# Patient Record
Sex: Female | Born: 1935 | Race: White | Hispanic: No | Smoking: Former smoker
Health system: Southern US, Community
[De-identification: ages and names within clinical notes are randomized; demographics above are authoritative.]

## PROBLEM LIST (undated history)

## (undated) DIAGNOSIS — Z87891 Personal history of nicotine dependence: Secondary | ICD-10-CM

## (undated) DIAGNOSIS — R911 Solitary pulmonary nodule: Secondary | ICD-10-CM

## (undated) DIAGNOSIS — R296 Repeated falls: Secondary | ICD-10-CM

## (undated) DIAGNOSIS — R350 Frequency of micturition: Secondary | ICD-10-CM

## (undated) DIAGNOSIS — I10 Essential (primary) hypertension: Secondary | ICD-10-CM

## (undated) DIAGNOSIS — D509 Iron deficiency anemia, unspecified: Secondary | ICD-10-CM

## (undated) DIAGNOSIS — K219 Gastro-esophageal reflux disease without esophagitis: Secondary | ICD-10-CM

## (undated) DIAGNOSIS — L299 Pruritus, unspecified: Secondary | ICD-10-CM

## (undated) DIAGNOSIS — I3139 Other pericardial effusion (noninflammatory): Secondary | ICD-10-CM

## (undated) DIAGNOSIS — F22 Delusional disorders: Secondary | ICD-10-CM

## (undated) DIAGNOSIS — J309 Allergic rhinitis, unspecified: Secondary | ICD-10-CM

## (undated) DIAGNOSIS — E785 Hyperlipidemia, unspecified: Secondary | ICD-10-CM

## (undated) DIAGNOSIS — M810 Age-related osteoporosis without current pathological fracture: Secondary | ICD-10-CM

## (undated) DIAGNOSIS — M1990 Unspecified osteoarthritis, unspecified site: Secondary | ICD-10-CM

## (undated) DIAGNOSIS — S72143A Displaced intertrochanteric fracture of unspecified femur, initial encounter for closed fracture: Secondary | ICD-10-CM

## (undated) DIAGNOSIS — E876 Hypokalemia: Secondary | ICD-10-CM

## (undated) DIAGNOSIS — K635 Polyp of colon: Secondary | ICD-10-CM

## (undated) DIAGNOSIS — F411 Generalized anxiety disorder: Secondary | ICD-10-CM

## (undated) DIAGNOSIS — N184 Chronic kidney disease, stage 4 (severe): Secondary | ICD-10-CM

## (undated) DIAGNOSIS — F0151 Vascular dementia with behavioral disturbance: Secondary | ICD-10-CM

## (undated) DIAGNOSIS — G459 Transient cerebral ischemic attack, unspecified: Secondary | ICD-10-CM

## (undated) DIAGNOSIS — G2581 Restless legs syndrome: Secondary | ICD-10-CM

## (undated) DIAGNOSIS — E039 Hypothyroidism, unspecified: Secondary | ICD-10-CM

## (undated) DIAGNOSIS — I313 Pericardial effusion (noninflammatory): Secondary | ICD-10-CM

## (undated) DIAGNOSIS — R634 Abnormal weight loss: Secondary | ICD-10-CM

## (undated) DIAGNOSIS — M545 Low back pain: Secondary | ICD-10-CM

## (undated) HISTORY — DX: Iron deficiency anemia, unspecified: D50.9

## (undated) HISTORY — PX: KNEE SURGERY: SHX244

## (undated) HISTORY — PX: COLONOSCOPY: SHX174

## (undated) HISTORY — PX: TONSILLECTOMY: SUR1361

## (undated) HISTORY — PX: BACK SURGERY: SHX140

## (undated) HISTORY — DX: Unspecified osteoarthritis, unspecified site: M19.90

## (undated) HISTORY — PX: LAMINECTOMY: SHX219

## (undated) HISTORY — PX: FEMUR IM NAIL: SHX1597

## (undated) HISTORY — DX: Vascular dementia with behavioral disturbance: F01.51

## (undated) HISTORY — PX: TONSILLECTOMY: SHX5217

## (undated) HISTORY — DX: Allergic rhinitis, unspecified: J30.9

## (undated) HISTORY — DX: Low back pain: M54.5

## (undated) HISTORY — DX: Delusional disorders: F22

## (undated) HISTORY — DX: Abnormal weight loss: R63.4

## (undated) HISTORY — DX: Hypokalemia: E87.6

## (undated) HISTORY — DX: Frequency of micturition: R35.0

## (undated) HISTORY — PX: HEMORRHOID SURGERY: SHX153

## (undated) HISTORY — PX: KNEE ARTHROSCOPY: SUR90

## (undated) HISTORY — DX: Generalized anxiety disorder: F41.1

## (undated) HISTORY — DX: Gastro-esophageal reflux disease without esophagitis: K21.9

## (undated) HISTORY — PX: ABDOMINAL HYSTERECTOMY: SHX81

## (undated) HISTORY — DX: Age-related osteoporosis without current pathological fracture: M81.0

## (undated) HISTORY — PX: APPENDECTOMY: SHX54

## (undated) HISTORY — DX: Pruritus, unspecified: L29.9

---

## 1998-04-06 ENCOUNTER — Encounter: Admission: RE | Admit: 1998-04-06 | Discharge: 1998-07-05 | Payer: Self-pay | Admitting: Orthopaedic Surgery

## 2000-07-16 DIAGNOSIS — K635 Polyp of colon: Secondary | ICD-10-CM

## 2000-07-16 HISTORY — DX: Polyp of colon: K63.5

## 2000-10-01 ENCOUNTER — Encounter (INDEPENDENT_AMBULATORY_CARE_PROVIDER_SITE_OTHER): Payer: Self-pay | Admitting: *Deleted

## 2000-10-01 ENCOUNTER — Ambulatory Visit (HOSPITAL_COMMUNITY): Admission: RE | Admit: 2000-10-01 | Discharge: 2000-10-01 | Payer: Self-pay | Admitting: Gastroenterology

## 2004-02-28 ENCOUNTER — Encounter: Admission: RE | Admit: 2004-02-28 | Discharge: 2004-02-28 | Payer: Self-pay | Admitting: Orthopedic Surgery

## 2004-03-01 ENCOUNTER — Ambulatory Visit (HOSPITAL_COMMUNITY): Admission: RE | Admit: 2004-03-01 | Discharge: 2004-03-01 | Payer: Self-pay | Admitting: Orthopedic Surgery

## 2004-03-01 ENCOUNTER — Ambulatory Visit (HOSPITAL_BASED_OUTPATIENT_CLINIC_OR_DEPARTMENT_OTHER): Admission: RE | Admit: 2004-03-01 | Discharge: 2004-03-01 | Payer: Self-pay | Admitting: Orthopedic Surgery

## 2004-05-18 ENCOUNTER — Ambulatory Visit: Payer: Self-pay | Admitting: Internal Medicine

## 2004-06-13 ENCOUNTER — Ambulatory Visit (HOSPITAL_COMMUNITY): Admission: RE | Admit: 2004-06-13 | Discharge: 2004-06-13 | Payer: Self-pay | Admitting: Orthopedic Surgery

## 2004-06-26 ENCOUNTER — Ambulatory Visit: Payer: Self-pay | Admitting: Internal Medicine

## 2004-08-10 ENCOUNTER — Ambulatory Visit: Payer: Self-pay | Admitting: Internal Medicine

## 2004-08-24 ENCOUNTER — Ambulatory Visit: Payer: Self-pay | Admitting: Internal Medicine

## 2004-09-26 ENCOUNTER — Ambulatory Visit: Payer: Self-pay | Admitting: Internal Medicine

## 2004-10-06 ENCOUNTER — Ambulatory Visit: Payer: Self-pay | Admitting: Internal Medicine

## 2004-11-09 ENCOUNTER — Ambulatory Visit: Payer: Self-pay | Admitting: Internal Medicine

## 2004-12-07 ENCOUNTER — Ambulatory Visit: Payer: Self-pay | Admitting: Internal Medicine

## 2005-01-29 ENCOUNTER — Ambulatory Visit: Payer: Self-pay | Admitting: Internal Medicine

## 2005-04-04 ENCOUNTER — Ambulatory Visit: Payer: Self-pay | Admitting: Family Medicine

## 2005-05-01 ENCOUNTER — Ambulatory Visit: Payer: Self-pay | Admitting: Internal Medicine

## 2005-07-05 ENCOUNTER — Ambulatory Visit: Payer: Self-pay | Admitting: Internal Medicine

## 2005-07-13 ENCOUNTER — Ambulatory Visit: Payer: Self-pay

## 2005-07-13 ENCOUNTER — Encounter: Payer: Self-pay | Admitting: Cardiology

## 2005-10-08 ENCOUNTER — Ambulatory Visit: Payer: Self-pay | Admitting: Internal Medicine

## 2005-10-23 ENCOUNTER — Encounter: Admission: RE | Admit: 2005-10-23 | Discharge: 2005-10-23 | Payer: Self-pay | Admitting: Orthopaedic Surgery

## 2006-01-08 ENCOUNTER — Ambulatory Visit: Payer: Self-pay | Admitting: Internal Medicine

## 2006-05-06 ENCOUNTER — Ambulatory Visit: Payer: Self-pay | Admitting: Internal Medicine

## 2006-06-18 ENCOUNTER — Ambulatory Visit: Payer: Self-pay | Admitting: Internal Medicine

## 2006-09-05 ENCOUNTER — Ambulatory Visit: Payer: Self-pay | Admitting: Internal Medicine

## 2006-09-05 LAB — CONVERTED CEMR LAB
ALT: 10 units/L (ref 0–40)
AST: 23 units/L (ref 0–37)
Albumin: 3.4 g/dL — ABNORMAL LOW (ref 3.5–5.2)
Alkaline Phosphatase: 64 units/L (ref 39–117)
BUN: 19 mg/dL (ref 6–23)
Basophils Absolute: 0 10*3/uL (ref 0.0–0.1)
Basophils Relative: 0.7 % (ref 0.0–1.0)
Bilirubin, Direct: 0.1 mg/dL (ref 0.0–0.3)
CO2: 29 meq/L (ref 19–32)
Calcium: 9.3 mg/dL (ref 8.4–10.5)
Chloride: 103 meq/L (ref 96–112)
Cholesterol: 235 mg/dL (ref 0–200)
Creatinine, Ser: 1.8 mg/dL — ABNORMAL HIGH (ref 0.4–1.2)
Direct LDL: 151.6 mg/dL
Eosinophils Absolute: 0.1 10*3/uL (ref 0.0–0.6)
Eosinophils Relative: 2.2 % (ref 0.0–5.0)
GFR calc Af Amer: 35 mL/min
GFR calc non Af Amer: 29 mL/min
Glucose, Bld: 97 mg/dL (ref 70–99)
HCT: 35 % — ABNORMAL LOW (ref 36.0–46.0)
HDL: 59.3 mg/dL (ref >39.0)
Hemoglobin: 11.9 g/dL — ABNORMAL LOW (ref 12.0–15.0)
Lymphocytes Relative: 24.2 % (ref 12.0–46.0)
MCHC: 33.9 g/dL (ref 30.0–36.0)
MCV: 87.5 fL (ref 78.0–100.0)
Monocytes Absolute: 0.3 10*3/uL (ref 0.2–0.7)
Monocytes Relative: 5.5 % (ref 3.0–11.0)
Neutro Abs: 4.1 10*3/uL (ref 1.4–7.7)
Neutrophils Relative %: 67.4 % (ref 43.0–77.0)
Platelets: 291 10*3/uL (ref 150–400)
Potassium: 3.7 meq/L (ref 3.5–5.1)
RBC: 4 M/uL (ref 3.87–5.11)
RDW: 14.8 % — ABNORMAL HIGH (ref 11.5–14.6)
Sodium: 140 meq/L (ref 135–145)
TSH: 3.23 microintl units/mL (ref 0.35–5.50)
Total Bilirubin: 0.8 mg/dL (ref 0.3–1.2)
Total CHOL/HDL Ratio: 4
Total Protein: 6.8 g/dL (ref 6.0–8.3)
Triglycerides: 98 mg/dL (ref 0–149)
VLDL: 20 mg/dL (ref 0–40)
WBC: 5.9 10*3/uL (ref 4.5–10.5)

## 2006-09-12 ENCOUNTER — Ambulatory Visit: Payer: Self-pay | Admitting: Internal Medicine

## 2006-10-01 ENCOUNTER — Ambulatory Visit: Payer: Self-pay | Admitting: Internal Medicine

## 2006-10-08 ENCOUNTER — Encounter: Admission: RE | Admit: 2006-10-08 | Discharge: 2006-10-08 | Payer: Self-pay | Admitting: Orthopaedic Surgery

## 2006-12-04 ENCOUNTER — Encounter: Admission: RE | Admit: 2006-12-04 | Discharge: 2006-12-04 | Payer: Self-pay | Admitting: Orthopaedic Surgery

## 2006-12-11 ENCOUNTER — Ambulatory Visit: Payer: Self-pay | Admitting: Internal Medicine

## 2006-12-20 ENCOUNTER — Encounter: Admission: RE | Admit: 2006-12-20 | Discharge: 2006-12-20 | Payer: Self-pay | Admitting: Orthopaedic Surgery

## 2007-02-04 DIAGNOSIS — E039 Hypothyroidism, unspecified: Secondary | ICD-10-CM | POA: Insufficient documentation

## 2007-02-04 DIAGNOSIS — M810 Age-related osteoporosis without current pathological fracture: Secondary | ICD-10-CM

## 2007-02-04 DIAGNOSIS — K219 Gastro-esophageal reflux disease without esophagitis: Secondary | ICD-10-CM

## 2007-02-04 DIAGNOSIS — J309 Allergic rhinitis, unspecified: Secondary | ICD-10-CM | POA: Insufficient documentation

## 2007-02-04 DIAGNOSIS — I10 Essential (primary) hypertension: Secondary | ICD-10-CM | POA: Insufficient documentation

## 2007-02-04 HISTORY — DX: Age-related osteoporosis without current pathological fracture: M81.0

## 2007-02-04 HISTORY — DX: Allergic rhinitis, unspecified: J30.9

## 2007-02-04 HISTORY — DX: Gastro-esophageal reflux disease without esophagitis: K21.9

## 2007-02-05 ENCOUNTER — Encounter: Payer: Self-pay | Admitting: Internal Medicine

## 2007-03-05 ENCOUNTER — Ambulatory Visit: Payer: Self-pay | Admitting: Internal Medicine

## 2007-04-29 ENCOUNTER — Encounter: Payer: Self-pay | Admitting: Internal Medicine

## 2007-05-15 ENCOUNTER — Telehealth: Payer: Self-pay | Admitting: Internal Medicine

## 2007-05-29 ENCOUNTER — Ambulatory Visit (HOSPITAL_COMMUNITY): Admission: RE | Admit: 2007-05-29 | Discharge: 2007-05-29 | Payer: Self-pay | Admitting: Neurosurgery

## 2007-06-03 ENCOUNTER — Encounter: Payer: Self-pay | Admitting: Internal Medicine

## 2007-06-04 ENCOUNTER — Ambulatory Visit: Payer: Self-pay | Admitting: Internal Medicine

## 2007-06-10 ENCOUNTER — Encounter: Payer: Self-pay | Admitting: Internal Medicine

## 2007-07-30 ENCOUNTER — Encounter: Payer: Self-pay | Admitting: Internal Medicine

## 2007-08-28 ENCOUNTER — Inpatient Hospital Stay (HOSPITAL_COMMUNITY): Admission: RE | Admit: 2007-08-28 | Discharge: 2007-09-01 | Payer: Self-pay | Admitting: Neurosurgery

## 2007-10-01 ENCOUNTER — Ambulatory Visit: Payer: Self-pay | Admitting: Internal Medicine

## 2007-11-08 ENCOUNTER — Emergency Department (HOSPITAL_COMMUNITY): Admission: EM | Admit: 2007-11-08 | Discharge: 2007-11-08 | Payer: Self-pay | Admitting: Emergency Medicine

## 2007-11-12 ENCOUNTER — Encounter: Payer: Self-pay | Admitting: Internal Medicine

## 2007-11-18 ENCOUNTER — Encounter: Admission: RE | Admit: 2007-11-18 | Discharge: 2007-11-18 | Payer: Self-pay | Admitting: Neurosurgery

## 2007-12-16 ENCOUNTER — Encounter: Admission: RE | Admit: 2007-12-16 | Discharge: 2007-12-16 | Payer: Self-pay | Admitting: Orthopedic Surgery

## 2007-12-30 ENCOUNTER — Encounter: Payer: Self-pay | Admitting: Internal Medicine

## 2007-12-31 ENCOUNTER — Ambulatory Visit: Payer: Self-pay | Admitting: Internal Medicine

## 2007-12-31 DIAGNOSIS — M1990 Unspecified osteoarthritis, unspecified site: Secondary | ICD-10-CM | POA: Insufficient documentation

## 2007-12-31 DIAGNOSIS — M545 Low back pain, unspecified: Secondary | ICD-10-CM

## 2007-12-31 HISTORY — DX: Unspecified osteoarthritis, unspecified site: M19.90

## 2007-12-31 HISTORY — DX: Low back pain, unspecified: M54.50

## 2008-01-06 ENCOUNTER — Encounter: Payer: Self-pay | Admitting: Neurosurgery

## 2008-01-12 ENCOUNTER — Ambulatory Visit (HOSPITAL_COMMUNITY): Admission: RE | Admit: 2008-01-12 | Discharge: 2008-01-12 | Payer: Self-pay | Admitting: Interventional Radiology

## 2008-01-20 ENCOUNTER — Telehealth: Payer: Self-pay | Admitting: Internal Medicine

## 2008-01-22 ENCOUNTER — Encounter: Payer: Self-pay | Admitting: Interventional Radiology

## 2008-02-02 ENCOUNTER — Telehealth: Payer: Self-pay | Admitting: Internal Medicine

## 2008-02-03 ENCOUNTER — Ambulatory Visit: Payer: Self-pay | Admitting: Internal Medicine

## 2008-02-03 DIAGNOSIS — N390 Urinary tract infection, site not specified: Secondary | ICD-10-CM

## 2008-02-03 LAB — CONVERTED CEMR LAB
Bilirubin Urine: NEGATIVE
Blood in Urine, dipstick: NEGATIVE
Glucose, Urine, Semiquant: NEGATIVE
Ketones, urine, test strip: NEGATIVE
Nitrite: NEGATIVE
Protein, U semiquant: 30
Specific Gravity, Urine: 1.02
Urobilinogen, UA: NEGATIVE
pH: 7

## 2008-02-10 ENCOUNTER — Encounter: Payer: Self-pay | Admitting: Internal Medicine

## 2008-04-01 ENCOUNTER — Telehealth: Payer: Self-pay | Admitting: Internal Medicine

## 2008-04-28 ENCOUNTER — Ambulatory Visit: Payer: Self-pay | Admitting: Internal Medicine

## 2008-04-28 DIAGNOSIS — R350 Frequency of micturition: Secondary | ICD-10-CM

## 2008-04-28 HISTORY — DX: Frequency of micturition: R35.0

## 2008-06-21 ENCOUNTER — Ambulatory Visit: Payer: Self-pay | Admitting: Internal Medicine

## 2008-06-21 LAB — CONVERTED CEMR LAB
Bilirubin Urine: NEGATIVE
Glucose, Urine, Semiquant: NEGATIVE
Ketones, urine, test strip: NEGATIVE
Nitrite: NEGATIVE
Protein, U semiquant: >=300
Specific Gravity, Urine: 1.01
Urobilinogen, UA: NEGATIVE
pH: 6

## 2008-06-22 ENCOUNTER — Encounter: Payer: Self-pay | Admitting: Emergency Medicine

## 2008-06-22 ENCOUNTER — Ambulatory Visit: Payer: Self-pay | Admitting: Interventional Radiology

## 2008-06-22 ENCOUNTER — Telehealth: Payer: Self-pay | Admitting: Internal Medicine

## 2008-06-23 ENCOUNTER — Ambulatory Visit: Payer: Self-pay | Admitting: Cardiovascular Disease

## 2008-06-23 ENCOUNTER — Encounter (INDEPENDENT_AMBULATORY_CARE_PROVIDER_SITE_OTHER): Payer: Self-pay | Admitting: Internal Medicine

## 2008-06-23 ENCOUNTER — Encounter: Payer: Self-pay | Admitting: Internal Medicine

## 2008-06-23 ENCOUNTER — Inpatient Hospital Stay (HOSPITAL_COMMUNITY): Admission: EM | Admit: 2008-06-23 | Discharge: 2008-06-26 | Payer: Self-pay | Admitting: Endocrinology

## 2008-06-23 ENCOUNTER — Ambulatory Visit: Payer: Self-pay | Admitting: Internal Medicine

## 2008-06-30 ENCOUNTER — Ambulatory Visit: Payer: Self-pay | Admitting: Internal Medicine

## 2008-06-30 DIAGNOSIS — D649 Anemia, unspecified: Secondary | ICD-10-CM

## 2008-06-30 LAB — CONVERTED CEMR LAB
Albumin: 3.1 g/dL — ABNORMAL LOW (ref 3.5–5.2)
BUN: 14 mg/dL (ref 6–23)
Basophils Absolute: 0 10*3/uL (ref 0.0–0.1)
Basophils Relative: 0.1 % (ref 0.0–3.0)
Blood in Urine, dipstick: NEGATIVE
CO2: 29 meq/L (ref 19–32)
Calcium: 9.5 mg/dL (ref 8.4–10.5)
Chloride: 103 meq/L (ref 96–112)
Creatinine, Ser: 1.5 mg/dL — ABNORMAL HIGH (ref 0.4–1.2)
Eosinophils Absolute: 0.1 10*3/uL (ref 0.0–0.7)
Eosinophils Relative: 0.5 % (ref 0.0–5.0)
GFR calc Af Amer: 43 mL/min
GFR calc non Af Amer: 36 mL/min
Glucose, Bld: 98 mg/dL (ref 70–99)
Glucose, Urine, Semiquant: NEGATIVE
HCT: 31.3 % — ABNORMAL LOW (ref 36.0–46.0)
Hemoglobin: 10.7 g/dL — ABNORMAL LOW (ref 12.0–15.0)
Ketones, urine, test strip: NEGATIVE
Lymphocytes Relative: 17.1 % (ref 12.0–46.0)
MCHC: 34.2 g/dL (ref 30.0–36.0)
MCV: 92.3 fL (ref 78.0–100.0)
Monocytes Absolute: 0.4 10*3/uL (ref 0.1–1.0)
Monocytes Relative: 4.3 % (ref 3.0–12.0)
Neutro Abs: 8 10*3/uL — ABNORMAL HIGH (ref 1.4–7.7)
Neutrophils Relative %: 78 % — ABNORMAL HIGH (ref 43.0–77.0)
Nitrite: NEGATIVE
Phosphorus: 4.2 mg/dL (ref 2.3–4.6)
Platelets: 507 10*3/uL — ABNORMAL HIGH (ref 150–400)
Potassium: 4.6 meq/L (ref 3.5–5.1)
Protein, U semiquant: 30
RBC: 3.39 M/uL — ABNORMAL LOW (ref 3.87–5.11)
RDW: 13.7 % (ref 11.5–14.6)
Sodium: 142 meq/L (ref 135–145)
Specific Gravity, Urine: 1.025
Urobilinogen, UA: NEGATIVE
WBC Urine, dipstick: NEGATIVE
WBC: 10.3 10*3/uL (ref 4.5–10.5)
pH: 5

## 2008-07-02 ENCOUNTER — Telehealth: Payer: Self-pay | Admitting: Internal Medicine

## 2008-07-06 ENCOUNTER — Ambulatory Visit: Payer: Self-pay | Admitting: Internal Medicine

## 2008-07-06 LAB — CONVERTED CEMR LAB
Bilirubin Urine: NEGATIVE
Blood in Urine, dipstick: NEGATIVE
Glucose, Urine, Semiquant: NEGATIVE
Ketones, urine, test strip: NEGATIVE
Nitrite: NEGATIVE
Specific Gravity, Urine: 1.015
Urobilinogen, UA: NEGATIVE
pH: 6.5

## 2008-07-14 ENCOUNTER — Telehealth: Payer: Self-pay | Admitting: Family Medicine

## 2008-08-11 ENCOUNTER — Ambulatory Visit: Payer: Self-pay | Admitting: Internal Medicine

## 2008-08-11 DIAGNOSIS — D509 Iron deficiency anemia, unspecified: Secondary | ICD-10-CM | POA: Insufficient documentation

## 2008-08-11 DIAGNOSIS — F411 Generalized anxiety disorder: Secondary | ICD-10-CM | POA: Insufficient documentation

## 2008-08-11 HISTORY — DX: Generalized anxiety disorder: F41.1

## 2008-08-11 HISTORY — DX: Iron deficiency anemia, unspecified: D50.9

## 2008-08-19 ENCOUNTER — Telehealth: Payer: Self-pay | Admitting: Internal Medicine

## 2008-08-23 ENCOUNTER — Telehealth: Payer: Self-pay | Admitting: Internal Medicine

## 2008-10-11 ENCOUNTER — Telehealth: Payer: Self-pay | Admitting: Internal Medicine

## 2008-11-05 ENCOUNTER — Ambulatory Visit: Payer: Self-pay | Admitting: Family Medicine

## 2008-11-05 ENCOUNTER — Ambulatory Visit: Payer: Self-pay | Admitting: Cardiology

## 2008-11-05 DIAGNOSIS — R634 Abnormal weight loss: Secondary | ICD-10-CM

## 2008-11-05 DIAGNOSIS — R4182 Altered mental status, unspecified: Secondary | ICD-10-CM | POA: Insufficient documentation

## 2008-11-05 DIAGNOSIS — R5381 Other malaise: Secondary | ICD-10-CM

## 2008-11-05 DIAGNOSIS — R5383 Other fatigue: Secondary | ICD-10-CM

## 2008-11-05 HISTORY — DX: Abnormal weight loss: R63.4

## 2008-11-05 LAB — CONVERTED CEMR LAB
CO2: 30 meq/L (ref 19–32)
Calcium: 10 mg/dL (ref 8.4–10.5)
Chloride: 106 meq/L (ref 96–112)
Glucose, Bld: 99 mg/dL (ref 70–99)
Potassium: 2.6 meq/L — CL (ref 3.5–5.1)
Sodium: 143 meq/L (ref 135–145)

## 2008-11-08 ENCOUNTER — Ambulatory Visit: Payer: Self-pay | Admitting: Family Medicine

## 2008-11-08 DIAGNOSIS — E876 Hypokalemia: Secondary | ICD-10-CM

## 2008-11-08 HISTORY — DX: Hypokalemia: E87.6

## 2008-11-15 ENCOUNTER — Ambulatory Visit: Payer: Self-pay | Admitting: Internal Medicine

## 2008-11-24 ENCOUNTER — Telehealth: Payer: Self-pay | Admitting: Internal Medicine

## 2008-11-25 ENCOUNTER — Ambulatory Visit: Payer: Self-pay | Admitting: Internal Medicine

## 2008-11-25 DIAGNOSIS — F0151 Vascular dementia with behavioral disturbance: Secondary | ICD-10-CM

## 2008-11-25 DIAGNOSIS — F22 Delusional disorders: Secondary | ICD-10-CM

## 2008-11-25 DIAGNOSIS — F0152 Vascular dementia, unspecified severity, with psychotic disturbance: Secondary | ICD-10-CM

## 2008-11-25 HISTORY — DX: Vascular dementia, unspecified severity, with psychotic disturbance: F01.52

## 2008-11-25 HISTORY — DX: Vascular dementia with behavioral disturbance: F01.51

## 2009-01-18 ENCOUNTER — Ambulatory Visit: Payer: Self-pay | Admitting: Internal Medicine

## 2009-01-18 ENCOUNTER — Telehealth: Payer: Self-pay | Admitting: Internal Medicine

## 2009-01-18 LAB — CONVERTED CEMR LAB
CO2: 29 meq/L (ref 19–32)
Chloride: 108 meq/L (ref 96–112)
Glucose, Bld: 77 mg/dL (ref 70–99)
Potassium: 4 meq/L (ref 3.5–5.1)
Sed Rate: 33 mm/hr — ABNORMAL HIGH (ref 0–22)
Sodium: 145 meq/L (ref 135–145)

## 2009-01-20 ENCOUNTER — Telehealth: Payer: Self-pay | Admitting: Internal Medicine

## 2009-03-18 ENCOUNTER — Ambulatory Visit: Payer: Self-pay | Admitting: Internal Medicine

## 2009-03-18 DIAGNOSIS — G2581 Restless legs syndrome: Secondary | ICD-10-CM | POA: Insufficient documentation

## 2009-03-18 LAB — CONVERTED CEMR LAB
Glucose, Urine, Semiquant: NEGATIVE
Specific Gravity, Urine: 1.01
pH: 7

## 2009-04-15 ENCOUNTER — Telehealth: Payer: Self-pay | Admitting: Internal Medicine

## 2009-05-02 ENCOUNTER — Telehealth: Payer: Self-pay | Admitting: Internal Medicine

## 2009-05-02 ENCOUNTER — Ambulatory Visit: Payer: Self-pay | Admitting: Internal Medicine

## 2009-05-02 LAB — CONVERTED CEMR LAB
Basophils Absolute: 0.1 10*3/uL (ref 0.0–0.1)
Basophils Relative: 0.9 % (ref 0.0–3.0)
CO2: 30 meq/L (ref 19–32)
Chloride: 106 meq/L (ref 96–112)
Creatinine, Ser: 1.4 mg/dL — ABNORMAL HIGH (ref 0.4–1.2)
Hemoglobin: 12.1 g/dL (ref 12.0–15.0)
Lymphocytes Relative: 23.2 % (ref 12.0–46.0)
Monocytes Relative: 5.9 % (ref 3.0–12.0)
Neutro Abs: 4.4 10*3/uL (ref 1.4–7.7)
Neutrophils Relative %: 65.3 % (ref 43.0–77.0)
Potassium: 4 meq/L (ref 3.5–5.1)
RBC: 3.66 M/uL — ABNORMAL LOW (ref 3.87–5.11)
RDW: 13.8 % (ref 11.5–14.6)
Sed Rate: 32 mm/hr — ABNORMAL HIGH (ref 0–22)

## 2009-05-17 ENCOUNTER — Ambulatory Visit: Payer: Self-pay | Admitting: Internal Medicine

## 2009-05-26 ENCOUNTER — Telehealth: Payer: Self-pay | Admitting: Internal Medicine

## 2009-05-30 ENCOUNTER — Telehealth: Payer: Self-pay | Admitting: Internal Medicine

## 2009-06-03 ENCOUNTER — Ambulatory Visit: Payer: Self-pay | Admitting: Internal Medicine

## 2009-06-03 DIAGNOSIS — L299 Pruritus, unspecified: Secondary | ICD-10-CM | POA: Insufficient documentation

## 2009-06-03 HISTORY — DX: Pruritus, unspecified: L29.9

## 2009-06-06 ENCOUNTER — Telehealth: Payer: Self-pay | Admitting: Internal Medicine

## 2009-06-14 ENCOUNTER — Telehealth: Payer: Self-pay | Admitting: Internal Medicine

## 2009-06-23 ENCOUNTER — Emergency Department (HOSPITAL_COMMUNITY): Admission: EM | Admit: 2009-06-23 | Discharge: 2009-06-23 | Payer: Self-pay | Admitting: Emergency Medicine

## 2009-07-17 ENCOUNTER — Emergency Department (HOSPITAL_COMMUNITY): Admission: EM | Admit: 2009-07-17 | Discharge: 2009-07-17 | Payer: Self-pay | Admitting: Family Medicine

## 2009-09-21 ENCOUNTER — Telehealth: Payer: Self-pay | Admitting: Internal Medicine

## 2009-12-20 ENCOUNTER — Ambulatory Visit: Payer: Self-pay | Admitting: Internal Medicine

## 2010-01-09 ENCOUNTER — Telehealth: Payer: Self-pay | Admitting: Internal Medicine

## 2010-02-06 ENCOUNTER — Ambulatory Visit: Payer: Self-pay | Admitting: Internal Medicine

## 2010-02-06 DIAGNOSIS — B029 Zoster without complications: Secondary | ICD-10-CM | POA: Insufficient documentation

## 2010-02-07 ENCOUNTER — Telehealth: Payer: Self-pay | Admitting: Internal Medicine

## 2010-04-18 ENCOUNTER — Ambulatory Visit: Payer: Self-pay | Admitting: Internal Medicine

## 2010-08-07 ENCOUNTER — Encounter: Payer: Self-pay | Admitting: Internal Medicine

## 2010-08-13 LAB — CONVERTED CEMR LAB
ALT: 12 units/L (ref 0–35)
AST: 21 units/L (ref 0–37)
Albumin: 3.5 g/dL (ref 3.5–5.2)
Alkaline Phosphatase: 51 units/L (ref 39–117)
BUN: 13 mg/dL (ref 6–23)
Basophils Absolute: 0 10*3/uL (ref 0.0–0.1)
Basophils Relative: 0.2 % (ref 0.0–3.0)
Bilirubin, Direct: 0.1 mg/dL (ref 0.0–0.3)
CO2: 30 meq/L (ref 19–32)
Calcium: 9.5 mg/dL (ref 8.4–10.5)
Chloride: 106 meq/L (ref 96–112)
Creatinine, Ser: 1.6 mg/dL — ABNORMAL HIGH (ref 0.4–1.2)
Eosinophils Absolute: 0 10*3/uL (ref 0.0–0.7)
Eosinophils Relative: 0.4 % (ref 0.0–5.0)
GFR calc Af Amer: 40 mL/min
GFR calc non Af Amer: 33 mL/min
Glucose, Bld: 108 mg/dL — ABNORMAL HIGH (ref 70–99)
HCT: 37.1 % (ref 36.0–46.0)
Hemoglobin: 12.4 g/dL (ref 12.0–15.0)
Lymphocytes Relative: 23.8 % (ref 12.0–46.0)
MCHC: 33.4 g/dL (ref 30.0–36.0)
MCV: 88.7 fL (ref 78.0–100.0)
Monocytes Absolute: 0.4 10*3/uL (ref 0.1–1.0)
Monocytes Relative: 4.9 % (ref 3.0–12.0)
Neutro Abs: 5.8 10*3/uL (ref 1.4–7.7)
Neutrophils Relative %: 70.7 % (ref 43.0–77.0)
Platelets: 324 10*3/uL (ref 150–400)
Potassium: 3.7 meq/L (ref 3.5–5.1)
RBC: 4.18 M/uL (ref 3.87–5.11)
RDW: 15.3 % — ABNORMAL HIGH (ref 11.5–14.6)
Sodium: 146 meq/L — ABNORMAL HIGH (ref 135–145)
TSH: 3.73 microintl units/mL (ref 0.35–5.50)
Total Bilirubin: 0.7 mg/dL (ref 0.3–1.2)
Total Protein: 6.8 g/dL (ref 6.0–8.3)
Vitamin B-12: 278 pg/mL (ref 211–911)
WBC: 8.1 10*3/uL (ref 4.5–10.5)

## 2010-08-17 NOTE — Progress Notes (Signed)
Summary: constipation on Toviaz  Phone Note Call from Patient   Caller: Daughter Call For: Gordy Savers  MD Summary of Call: Jennifer [name] works great, but makes her constipated.  Anything else that will work and not give her constipation or if she could take RX  for the constipation?   [phone]  Aram Beecham Initial call taken by: Lynann Beaver CMA,  January 09, 2010 1:31 PM  Follow-up for Phone Call        try generic ditropan xl 10 mg  one every am  #30 RF 6 Follow-up by: Gordy Savers  MD,  January 09, 2010 5:42 PM    New/Updated Medications: OXYBUTYNIN CHLORIDE 10 MG XR24H-TAB (OXYBUTYNIN CHLORIDE) one by mouth q day Prescriptions: OXYBUTYNIN CHLORIDE 10 MG XR24H-TAB (OXYBUTYNIN CHLORIDE) one by mouth q day  #30 x 6   Entered by:   Lynann Beaver CMA   Authorized by:   Gordy Savers  MD   Signed by:   Lynann Beaver CMA on 01/10/2010   Method used:   [facility]  [place] [address]       [address]       [place], [place]  [address]       Ph: [phone] or [phone]       Fax: [phone]   RxID:   [id number] OXYBUTYNIN CHLORIDE 10 MG XR24H-TAB (OXYBUTYNIN CHLORIDE) one by mouth q day  #30 x 6   Entered by:   Lynann Beaver CMA   Authorized by:   Gordy Savers  MD   Signed by:   Lynann Beaver CMA on 01/10/2010   Method used:   [facility]        [facility]  [phone][facility] (retail)       [address] [place], [place]  [address]       Ph: [phone] or [phone]       Fax: [phone]   RxID:   [id number]  Daughter notified.

## 2010-08-17 NOTE — Assessment & Plan Note (Signed)
Summary: FLU SHOT // RS  Nurse Visit   Allergies: 1)  ! Cipro (Ciprofloxacin Hcl) 2)  Valium (Diazepam) 3)  Demerol (Meperidine Hcl)  Immunizations Administered:  Influenza Vaccine # 1:    Vaccine Type: Fluvax MCR    Site: left deltoid    Mfr: GlaxoSmithKline    Dose: 0.5 ml    Route: IM    Given by: Kathrynn Speed CMA    Exp. Date: 01/13/2011    Lot #: [id number]    VIS given: 02/07/10 version given April 18, 2010.  Flu Vaccine Consent Questions:    Do you have a history of severe allergic reactions to this vaccine? no    Any prior history of allergic reactions to egg and/or gelatin? no    Do you have a sensitivity to the preservative Thimersol? no    Do you have a past history of Guillan-Barre Syndrome? no    Do you currently have an acute febrile illness? no    Have you ever had a severe reaction to latex? no    Vaccine information given and explained to patient? yes    Are you currently pregnant? no  Orders Added: 1)  Influenza Vaccine MCR [00025]

## 2010-08-17 NOTE — Progress Notes (Signed)
Summary: refill oxybutynin  Phone Note Refill Request Message from:  Fax from Pharmacy on February 07, 2010 11:53 AM  Refills Requested: Medication #1:  OXYBUTYNIN CHLORIDE 10 MG XR24H-TAB one by mouth q day [facility]    Method Requested: Telephone to Pharmacy Initial call taken by: Duard Brady LPN,  February 07, 2010 11:54 AM  Follow-up for Phone Call        ok  rf 4 Follow-up by: Gordy Savers  MD,  February 07, 2010 12:45 PM    Prescriptions: OXYBUTYNIN CHLORIDE 10 MG XR24H-TAB (OXYBUTYNIN CHLORIDE) one by mouth q day  #30 x 4   Entered by:   Duard Brady LPN   Authorized by:   Gordy Savers  MD   Signed by:   Duard Brady LPN on 24/40/1027   Method used:   [facility]        [facility]  [place] [address] [facility]       [address]       [place], [place]  [address]       Ph: [phone] or [phone]       Fax: [phone]   RxID:   [id number]

## 2010-08-17 NOTE — Assessment & Plan Note (Signed)
Summary: med check/refills/cjr---PTS [facility] / [facility]   Vital Signs:  Patient profile:   77 year old female Weight:      140 pounds Temp:     98.3 degrees F oral BP sitting:   122 / 80  (right arm) Cuff size:   regular  Vitals Entered By: Duard Brady LPN (December 21, 5782 10:47 AM) CC: medication review with refills Is Patient Diabetic? No   CC:  medication review with refills.  History of Present Illness: 77 year old patient who is seen today for follow-up.  She has a history of mild senile dementia, hypertension, osteoporosis.  She also has a history of restless leg syndrome, and over active bladder.  Her main complaint is urinary frequency.  She was given a trial of  VESIcare in the past which can aggravate her restless leg syndrome.  She is doing quite well.  No other concerns or complaints.  Her last visit here was approximately 7 months ago.  Preventive Screening-Counseling & Management  Alcohol-Tobacco     Smoking Status: quit  Allergies: 1)  ! Cipro (Ciprofloxacin Hcl) 2)  Valium (Diazepam) 3)  Demerol (Meperidine Hcl)  Past History:  Past Medical History: Reviewed history from 05/02/2009 and no changes required. Hypertension Osteoporosis DJD Hypothyroidism Allergic rhinitis,Seasonale GERD Low back pain Osteoarthritis UTI Anxiety Anemia-iron deficiency hypokalemia dementia secondary to microvascular disease (Possible LBD)   Social History: Smoking Status:  quit  Review of Systems  The patient denies anorexia, fever, weight loss, weight gain, vision loss, decreased hearing, hoarseness, chest pain, syncope, dyspnea on exertion, peripheral edema, prolonged cough, headaches, hemoptysis, abdominal pain, melena, hematochezia, severe indigestion/heartburn, hematuria, incontinence, genital sores, muscle weakness, suspicious skin lesions, transient blindness, difficulty walking, depression, unusual weight change, abnormal bleeding, enlarged lymph nodes,  angioedema, and breast masses.    Physical Exam  General:  Well-developed,well-nourished,in no acute distress; alert,appropriate and cooperative throughout examination Head:  Normocephalic and atraumatic without obvious abnormalities. No apparent alopecia or balding. Eyes:  No corneal or conjunctival inflammation noted. EOMI. Perrla. Funduscopic exam benign, without hemorrhages, exudates or papilledema. Vision grossly normal. Mouth:  Oral mucosa and oropharynx without lesions or exudates.  Teeth in good repair. Neck:  No deformities, masses, or tenderness noted. Lungs:  Normal respiratory effort, chest expands symmetrically. Lungs are clear to auscultation, no crackles or wheezes. Heart:  Normal rate and regular rhythm. S1 and S2 normal without gallop, murmur, click, rub or other extra sounds. Abdomen:  Bowel sounds positive,abdomen soft and non-tender without masses, organomegaly or hernias noted. Pulses:  R and L carotid,radial,femoral,dorsalis pedis and posterior tibial pulses are full and equal bilaterally Extremities:  No clubbing, cyanosis, edema, or deformity noted with normal full range of motion of all joints.   Neurologic:  alert & oriented X3, strength normal in all extremities, and sensation intact to light touch.  alert & oriented X3, strength normal in all extremities, and sensation intact to light touch.   Skin:  Intact without suspicious lesions or rashes Cervical Nodes:  No lymphadenopathy noted   Impression & Recommendations:  Problem # 1:  VASCULAR DEMENTIA WITH DELUSIONS (ICD-290.42)  Problem # 2:  FREQUENCY, URINARY (ICD-788.41) will give her a trial of Toviaz  Problem # 3:  HYPERTENSION (ICD-401.9)  The following medications were removed from the medication list:    Avapro 150 Mg Tabs (Irbesartan) ..... One daily Her updated medication list for this problem includes:    Losartan Potassium 100 Mg Tabs (Losartan potassium) ..... One daily  The  following  medications were removed from the medication list:    Avapro 150 Mg Tabs (Irbesartan) ..... One daily Her updated medication list for this problem includes:    Losartan Potassium 100 Mg Tabs (Losartan potassium) ..... One daily  Complete Medication List: 1)  Allegra 180 Mg Tabs (Fexofenadine hcl) .... Take 1 tablet by mouth once a day 2)  Fluoxetine Hcl 40 Mg Caps (Fluoxetine hcl) .... Take 1 capsule by mouth once a day 3)  Synthroid 50 Mcg Tabs (Levothyroxine sodium) .... Take 1 tablet by mouth once a day 4)  Prevacid 30 Mg Cpdr (Lansoprazole) .[name].. 1 once daily 5)  Foltabs 800 800-10-115 Mcg-mg-mcg Tabs (Folic acid-vit b6-vit b12) .... One daily 6)  Aspir-low 81 Mg Tbec (Aspirin) .... One daily 7)  Aricept 5 Mg Tabs (Donepezil hcl) .... One daily 8)  Namenda 10 Mg Tabs (Memantine hcl) .... One twice daily 9)  Losartan Potassium 100 Mg Tabs (Losartan potassium) .... One daily  Patient Instructions: 1)  Please schedule a follow-up appointment in 4 months. 2)  Limit your Sodium (Salt). 3)  It is important that you exercise regularly at least 20 minutes 5 times a week. If you develop chest pain, have severe difficulty breathing, or feel very tired , stop exercising immediately and seek medical attention. Prescriptions: LOSARTAN POTASSIUM 100 MG TABS (LOSARTAN POTASSIUM) one daily  #90 x 6   Entered and Authorized by:   Gordy Savers  MD   Signed by:   Gordy Savers  MD on 12/20/2009   Method used:   [facility]  [place] [address] [facility]       [address]       [place], [place]  [address]       Ph: [phone] or [phone]       Fax: [phone]   RxID:   [id number] NAMENDA 10 MG TABS (MEMANTINE HCL) one twice daily  #180 x 6   Entered and Authorized by:   Gordy Savers  MD   Signed by:   Gordy Savers  MD on 12/20/2009   Method used:   [facility]        [facility]  [place] [address] [facility]       [address]        [place], [place]  [address]       Ph: [phone] or [phone]       Fax: [phone]   RxID:   [id number] ARICEPT 5 MG TABS (DONEPEZIL HCL) one daily  #90 x 6   Entered and Authorized by:   Gordy Savers  MD   Signed by:   Gordy Savers  MD on 12/20/2009   Method used:   [facility]        [facility]  [place] [address]ail[facility]       [address]       [place], [place]  [address]       Ph: [phone] or [phone]       Fax: [phone]   RxID:   [id number] FOLTABS 800 800-10-115 MCG-MG-MCG TABS (FOLIC ACID-VIT B6-VIT B12) one daily  #90 x 3   Entered and Authorized by:   Gordy Savers  MD   Signed by:   Gordy Savers  MD on 12/20/2009   Method used:   [facility]        [facility]  [place] [address] [facility]ail)       [address]       [place], [place]  [address]       Ph: [phone] or [phone]       Fax: [phone]   RxID:   [id number] PREVACID 30 MG  CPDR (LANSOPRAZOLE) 1 once daily  #90 x 6   Entered and Authorized by:   Gordy Savers  MD   Signed by:   Gordy Savers  MD on 12/20/2009   Method used:   [facility]        [facility]  [place] [address]ail[facility]       [address]       [place], [place]  [address]       Ph: [phone] or [phone]       Fax: [phone]   RxID:   [id number] SYNTHROID 50 MCG TABS (LEVOTHYROXINE SODIUM) Take 1 tablet by mouth once a day  #90 Tablet x 4   Entered and Authorized by:   Gordy Savers  MD   Signed by:   Gordy Savers  MD on 12/20/2009   Method used:   [facility]        [facility]  [place] [address] ([facility]ail)       [address]       [place], [place]  [address]       Ph: [phone] or [phone]       Fax: [phone]   RxID:   [id number] FLUOXETINE HCL 40 MG CAPS (FLUOXETINE HCL) Take 1 capsule by mouth once a day  #90 Capsule x 4   Entered and Authorized by:   Gordy Savers  MD   Signed by:   Gordy Savers  MD on 12/20/2009   Method used:    [facility]        [facility]  [place] [address]ail[facility]       [address]       [place], [place]  [address]       Ph: [phone] or [phone]       Fax: [phone]   RxID:   [id number] ALLEGRA 180 MG TABS (FEXOFENADINE HCL) Take 1 tablet by mouth once a day  #90 x 6   Entered and Authorized by:   Gordy Savers  MD   Signed by:   Gordy Savers  MD on 12/20/2009   Method used:   [facility]ically to        [facility]  [place] [address] (retail)       [address]       [place], [place]  [address]       Ph: [phone] or [phone]       Fax: [phone]   RxID:   [id number]

## 2010-08-17 NOTE — Progress Notes (Signed)
Summary: other Rx  Phone Note Call from Patient   Caller: Daughter Call For: Jennifer Savers  MD Summary of Call: Avapro too expensive- can you order something cheaper? [facility] [phone] Initial call taken by: Raechel Ache, RN,  September 21, 2009 9:49 AM  Follow-up for Phone Call        generic cozaar 100 mg one daily  #30 one daily ROV 4 weeks Follow-up by: Jennifer Savers  MD,  September 21, 2009 12:31 PM    Prescriptions: NAMENDA 10 MG TABS (MEMANTINE HCL) one twice daily  #60 x 1   Entered by:   Duard Brady LPN   Authorized by:   Jennifer Savers  MD   Signed by:   Duard Brady LPN on 11/91/4782   Method used:   [facility]        [facility]  [place] [address] [facility]ail[facility]       [address]       [place], [place]  [address]       Ph: [phone] or [phone]       Fax: [phone]   RxID:   [id number] ARICEPT 5 MG TABS (DONEPEZIL HCL) one daily  #30 x 1   Entered by:   Duard Brady LPN   Authorized by:   Jennifer Savers  MD   Signed by:   Duard Brady LPN on 75/64/3329   Method used:   [facility]        [facility]  [place] [address] ([facility]ail[facility]       [address]       [place], [place]  [address]       Ph: [phone] or [phone]       Fax: [phone]   RxID:   [id number]  daughter aware  rx's done - must be seen in office 2-4 weeks , before rx's run out  [facility]

## 2010-08-17 NOTE — Assessment & Plan Note (Signed)
Summary: ?shingles over lft eye/cjr   Vital Signs:  Patient profile:   77 year old female Weight:      145 pounds Temp:     98.2 degrees F oral BP sitting:   128 / 78  (left arm) Cuff size:   regular  Vitals Entered By: Duard Brady LPN (February 06, 2010 10:09 AM) CC: c/o headache - rash to (L) forehead and eye lid, scalp too Is Patient Diabetic? No   CC:  c/o headache - rash to (L) forehead and eye lid and scalp too.  History of Present Illness: 77 year old patient with a one-week history of a painful rash involving the left for head region and scalp.  She has a history of hypothyroidism, mild dementia, and treated hypertension.  No prior history of shingles, and no history of a shingles vaccine  Allergies: 1)  ! Cipro (Ciprofloxacin Hcl) 2)  Valium (Diazepam) 3)  Demerol (Meperidine Hcl)  Review of Systems       The patient complains of suspicious skin lesions.  The patient denies anorexia, fever, weight loss, weight gain, vision loss, decreased hearing, hoarseness, chest pain, syncope, dyspnea on exertion, peripheral edema, prolonged cough, headaches, hemoptysis, abdominal pain, melena, hematochezia, severe indigestion/heartburn, hematuria, incontinence, genital sores, muscle weakness, transient blindness, difficulty walking, depression, unusual weight change, abnormal bleeding, enlarged lymph nodes, angioedema, and breast masses.    Physical Exam  General:  Well-developed,well-nourished,in no acute distress; alert,appropriate and cooperative throughout examination Skin:  dry crusted   lesions over the left for head and scalp area, consistent with resolving shingles   Impression & Recommendations:  Problem # 1:  SHINGLES (ICD-053.9)  Problem # 2:  HYPERTENSION (ICD-401.9)  Her updated medication list for this problem includes:    Losartan Potassium 100 Mg Tabs (Losartan potassium) ..... One daily  Her updated medication list for this problem includes:    Losartan  Potassium 100 Mg Tabs (Losartan potassium) ..... One daily  Complete Medication List: 1)  Allegra 180 Mg Tabs (Fexofenadine hcl) .... Take 1 tablet by mouth once a day 2)  Fluoxetine Hcl 40 Mg Caps (Fluoxetine hcl) .... Take 1 capsule by mouth once a day 3)  Synthroid 50 Mcg Tabs (Levothyroxine sodium) .... Take 1 tablet by mouth once a day 4)  Prevacid 30 Mg Cpdr (Lansoprazole) .[name].. 1 once daily 5)  Foltabs 800 800-10-115 Mcg-mg-mcg Tabs (Folic acid-vit b6-vit b12) .... One daily 6)  Aspir-low 81 Mg Tbec (Aspirin) .... One daily 7)  Aricept 5 Mg Tabs (Donepezil hcl) .... One daily 8)  Namenda 10 Mg Tabs (Memantine hcl) .... One twice daily 9)  Losartan Potassium 100 Mg Tabs (Losartan potassium) .... One daily 10)  Oxybutynin Chloride 10 Mg Xr24h-tab (Oxybutynin chloride) .... One by mouth q day 11)  Hydrocodone-acetaminophen 5-500 Mg Tabs (Hydrocodone-acetaminophen) .... One every 6 hours as needed for pain 12)  Gabapentin 100 Mg Caps (Gabapentin) .... One twice daily for 3 days, then two twice daily  Patient Instructions: 1)  Please schedule a follow-up appointment as needed. Prescriptions: GABAPENTIN 100 MG CAPS (GABAPENTIN) one twice daily for 3 days, then two twice daily  #50 x 3   Entered and Authorized by:   Gordy Savers  MD   Signed by:   Gordy Savers  MD on 02/06/2010   Method used:   Print then Give to Patient   RxID:   [id number] HYDROCODONE-ACETAMINOPHEN 5-500 MG TABS (HYDROCODONE-ACETAMINOPHEN) one every 6 hours as needed for pain  #50 x  0   Entered and Authorized by:   Gordy Savers  MD   Signed by:   Gordy Savers  MD on 02/06/2010   Method used:   Print then Give to Patient   RxID:   [id number]

## 2010-08-23 ENCOUNTER — Other Ambulatory Visit: Payer: Self-pay | Admitting: Internal Medicine

## 2010-08-23 DIAGNOSIS — Z0000 Encounter for general adult medical examination without abnormal findings: Secondary | ICD-10-CM

## 2010-09-15 ENCOUNTER — Other Ambulatory Visit: Payer: Self-pay | Admitting: Internal Medicine

## 2010-09-15 DIAGNOSIS — R4182 Altered mental status, unspecified: Secondary | ICD-10-CM

## 2010-09-16 ENCOUNTER — Inpatient Hospital Stay (HOSPITAL_COMMUNITY)
Admission: EM | Admit: 2010-09-16 | Discharge: 2010-09-18 | DRG: 884 | Disposition: A | Discharge status: Home Health | Payer: Medicare Other | Attending: Internal Medicine | Admitting: Internal Medicine

## 2010-09-16 ENCOUNTER — Emergency Department (HOSPITAL_COMMUNITY): Payer: Medicare Other

## 2010-09-16 DIAGNOSIS — E441 Mild protein-calorie malnutrition: Secondary | ICD-10-CM | POA: Diagnosis present

## 2010-09-16 DIAGNOSIS — E7800 Pure hypercholesterolemia, unspecified: Secondary | ICD-10-CM | POA: Diagnosis present

## 2010-09-16 DIAGNOSIS — I129 Hypertensive chronic kidney disease with stage 1 through stage 4 chronic kidney disease, or unspecified chronic kidney disease: Secondary | ICD-10-CM | POA: Diagnosis present

## 2010-09-16 DIAGNOSIS — E039 Hypothyroidism, unspecified: Secondary | ICD-10-CM | POA: Diagnosis present

## 2010-09-16 DIAGNOSIS — F0390 Unspecified dementia without behavioral disturbance: Principal | ICD-10-CM | POA: Diagnosis present

## 2010-09-16 DIAGNOSIS — R4182 Altered mental status, unspecified: Secondary | ICD-10-CM | POA: Diagnosis present

## 2010-09-16 DIAGNOSIS — R911 Solitary pulmonary nodule: Secondary | ICD-10-CM | POA: Diagnosis present

## 2010-09-16 DIAGNOSIS — N183 Chronic kidney disease, stage 3 unspecified: Secondary | ICD-10-CM | POA: Diagnosis present

## 2010-09-16 LAB — COMPREHENSIVE METABOLIC PANEL
AST: 28 U/L (ref 0–37)
Albumin: 3.7 g/dL (ref 3.5–5.2)
Alkaline Phosphatase: 150 U/L — ABNORMAL HIGH (ref 39–117)
Chloride: 108 mEq/L (ref 96–112)
GFR calc Af Amer: 41 mL/min — ABNORMAL LOW (ref >60)
Potassium: 3.8 mEq/L (ref 3.5–5.1)
Total Bilirubin: 0.7 mg/dL (ref 0.3–1.2)
Total Protein: 7.3 g/dL (ref 6.0–8.3)

## 2010-09-16 LAB — CBC
HCT: 41.6 % (ref 36.0–46.0)
Hemoglobin: 13.8 g/dL (ref 12.0–15.0)
MCV: 92.7 fL (ref 78.0–100.0)
RBC: 4.49 MIL/uL (ref 3.87–5.11)
WBC: 8.6 10*3/uL (ref 4.0–10.5)

## 2010-09-16 LAB — APTT: aPTT: 23 seconds — ABNORMAL LOW (ref 24–37)

## 2010-09-16 LAB — CARDIAC PANEL(CRET KIN+CKTOT+MB+TROPI)
CK, MB: 3 ng/mL (ref 0.3–4.0)
Total CK: 113 U/L (ref 7–177)
Troponin I: 0.01 ng/mL (ref 0.00–0.06)

## 2010-09-16 LAB — URINALYSIS, ROUTINE W REFLEX MICROSCOPIC
Bilirubin Urine: NEGATIVE
Glucose, UA: NEGATIVE mg/dL
Ketones, ur: NEGATIVE mg/dL
Leukocytes, UA: NEGATIVE
Protein, ur: 30 mg/dL — AB

## 2010-09-16 LAB — PROTIME-INR: INR: 0.96 (ref 0.00–1.49)

## 2010-09-16 LAB — DIFFERENTIAL
Basophils Relative: 0 % (ref 0–1)
Eosinophils Relative: 0 % (ref 0–5)
Lymphocytes Relative: 16 % (ref 12–46)
Monocytes Relative: 4 % (ref 3–12)
Neutrophils Relative %: 80 % — ABNORMAL HIGH (ref 43–77)

## 2010-09-16 LAB — POCT CARDIAC MARKERS: CKMB, poc: 1.6 ng/mL (ref 1.0–8.0)

## 2010-09-17 ENCOUNTER — Inpatient Hospital Stay (HOSPITAL_COMMUNITY): Payer: Medicare Other

## 2010-09-17 LAB — CARDIAC PANEL(CRET KIN+CKTOT+MB+TROPI)
CK, MB: 4.1 ng/mL — ABNORMAL HIGH (ref 0.3–4.0)
Relative Index: 2.6 — ABNORMAL HIGH (ref 0.0–2.5)
Total CK: 158 U/L (ref 7–177)
Total CK: 176 U/L (ref 7–177)
Troponin I: 0.02 ng/mL (ref 0.00–0.06)

## 2010-09-17 LAB — URINE CULTURE
Colony Count: NO GROWTH
Culture  Setup Time: 201203031904
Culture: NO GROWTH

## 2010-09-17 LAB — COMPREHENSIVE METABOLIC PANEL
ALT: 17 U/L (ref 0–35)
CO2: 25 mEq/L (ref 19–32)
Calcium: 9.1 mg/dL (ref 8.4–10.5)
Creatinine, Ser: 1.45 mg/dL — ABNORMAL HIGH (ref 0.4–1.2)
GFR calc non Af Amer: 35 mL/min — ABNORMAL LOW (ref >60)
Glucose, Bld: 117 mg/dL — ABNORMAL HIGH (ref 70–99)
Sodium: 141 mEq/L (ref 135–145)
Total Protein: 6.4 g/dL (ref 6.0–8.3)

## 2010-09-17 LAB — CBC
MCH: 30.6 pg (ref 26.0–34.0)
MCV: 91.5 fL (ref 78.0–100.0)
Platelets: 231 10*3/uL (ref 150–400)
RBC: 4.22 MIL/uL (ref 3.87–5.11)
RDW: 13.3 % (ref 11.5–15.5)
WBC: 7.7 10*3/uL (ref 4.0–10.5)

## 2010-09-17 LAB — D-DIMER, QUANTITATIVE: D-Dimer, Quant: 1.26 ug/mL-FEU — ABNORMAL HIGH (ref 0.00–0.48)

## 2010-09-17 LAB — MAGNESIUM: Magnesium: 2.1 mg/dL (ref 1.5–2.5)

## 2010-09-17 LAB — TSH: TSH: 4.11 u[IU]/mL (ref 0.350–4.500)

## 2010-09-18 ENCOUNTER — Inpatient Hospital Stay (HOSPITAL_COMMUNITY): Payer: Medicare Other

## 2010-09-18 DIAGNOSIS — I369 Nonrheumatic tricuspid valve disorder, unspecified: Secondary | ICD-10-CM

## 2010-09-18 DIAGNOSIS — G459 Transient cerebral ischemic attack, unspecified: Secondary | ICD-10-CM

## 2010-09-18 LAB — CBC
MCH: 30.2 pg (ref 26.0–34.0)
MCHC: 32 g/dL (ref 30.0–36.0)
MCV: 94.6 fL (ref 78.0–100.0)
Platelets: 235 10*3/uL (ref 150–400)
RBC: 4.1 MIL/uL (ref 3.87–5.11)

## 2010-09-18 LAB — COMPREHENSIVE METABOLIC PANEL
BUN: 16 mg/dL (ref 6–23)
CO2: 25 mEq/L (ref 19–32)
Chloride: 111 mEq/L (ref 96–112)
Creatinine, Ser: 1.42 mg/dL — ABNORMAL HIGH (ref 0.4–1.2)
GFR calc non Af Amer: 36 mL/min — ABNORMAL LOW (ref >60)
Glucose, Bld: 102 mg/dL — ABNORMAL HIGH (ref 70–99)
Total Bilirubin: 0.5 mg/dL (ref 0.3–1.2)

## 2010-09-18 MED ORDER — XENON XE 133 GAS
5.5000 | GAS_FOR_INHALATION | Freq: Once | RESPIRATORY_TRACT | Status: AC | PRN
  Administered 2010-09-18: 5.2 via RESPIRATORY_TRACT

## 2010-09-18 MED ORDER — TECHNETIUM TO 99M ALBUMIN AGGREGATED
5.2000 | Freq: Once | INTRAVENOUS | Status: AC | PRN
  Administered 2010-09-18: 5 via INTRAVENOUS

## 2010-09-21 NOTE — Discharge Summary (Signed)
NAME:  LATRESHIA, BEAUCHAINE                ACCOUNT NO.:  [id number]  MEDICAL RECORD NO.:  [id number]           PATIENT TYPE:  I  LOCATION:  1413                         FACILITY:  [facility]  PHYSICIAN:  Kashaun Bebo I Aries Townley, MD      DATE OF BIRTH:  12-06-1928  DATE OF ADMISSION:  09/16/2010 DATE OF DISCHARGE:  09/18/2010                              DISCHARGE SUMMARY   PRIMARY CARE PHYSICIAN:  Gordy Savers, MD  DISCHARGE DIAGNOSES: 1. Altered mental status, resolved, stroke was ruled out. 2. Hypertension, seems uncontrolled. 3. Hypothyroidism. 4. Chronic renal insufficiency, seems stable. 5. Hypercholesteremia. 6. 5 mm left lower lobe nodule, if the patient high-risk for     bronchogenic cancer, recommend 6-12 months.  DISCHARGE MEDICATIONS: 1. Norvasc 10 mg p.o. daily. 2. Arixtra 5 mg p.o. daily. 3. Aspirin 81 mg p.o. daily. 4. Calcium carbonate 600 mg. 5. Fluoxetine 40 mg. 6. Folic acid 1 tablet daily. 7. Losartan 100 mg. 8. Namenda 10 mg twice daily. 9. Oxybutynin 10 mg daily. 10.Prevacid 30 mg 1 tablet daily. 11.Synthroid 50 mcg p.o. daily.  PROCEDURES: 1. CT head without contrast, atrophy and chronic ischemic change. 2. CT chest without contrast, moderate large pericardial effusion,     coronary artery disease, negative for pneumonia. 3. MRI of the brain, advanced atrophy and chronic changes. 4. V/Q scan negative for pulmonary embolism. 5. 2-D echo, mild LVH, systolic function was normal, EF 55% to 60%, no     regional wall abnormality, mild atrium dilated, no defect or patent     foramen ovale was identified, large pericardial effusion was     identified.  HISTORY OF PRESENT ILLNESS:  This is a 77 year old female with a history of hypertension, dementia, hypercholesteremia, and hypothyroidism who was seen by family at 81, where they felt she was kind of confused and some altered mental status, they felt it could be some stroke versus TIA.  The patient admitted to the  hospital for close observation and monitoring. 1. Altered mental status, completely resolved.  MRI was negative.     Carotid duplex negative for any ICA stenosis.  The patient kept     under telemonitor where the patient remained under sinus rhythm, no     abnormal arrhythmia.  MRI did not show any evidence of acute     stroke. 2. Urinalysis was negative for any evidence of urinary tract     infection.  Her TSH was normal.  D-dimer mildly elevated, but the     V/Q was negative.  She was in mild shortness of breath.  Her BNP     was 258, and a CT scan without contrast suggested possibility of     large pericardial effusion, further that 2-D echo was done, which     did not show any acute or pericardial tamponade accordingly.  Felt     altered mental status could be secondary to the patient's baseline     dementia. 3. Hypertension.  Blood pressure remained uncontrolled.  We added     Norvasc 10 mg p.o. daily to her medication regime and the patient  was advised to follow up with her primary care physician as early     as next week.  Sharalyn Lomba Bosie Helper, MD     HIE/MEDQ  D:  09/18/2010  T:  09/19/2010  Job:  [id number]  cc:   Gordy Savers, MD [address] [place] [address]  Electronically Signed by Ebony Cargo MD on 09/20/2010 05:11:25 PM

## 2010-09-21 NOTE — H&P (Signed)
NAME:  Jennifer [name], Jennifer [name]                ACCOUNT NO.:  [id number]  MEDICAL RECORD NO.:  [id number]           PATIENT TYPE:  E  LOCATION:  WLED                         FACILITY:  [facility]  PHYSICIAN:  Massie Cogliano I Maelin Kurkowski, MD      DATE OF BIRTH:  Nov 29, 1928  DATE OF ADMISSION:  09/16/2010 DATE OF DISCHARGE:                             HISTORY & PHYSICAL   PRIMARY CARE PHYSICIAN:  Gordy Savers, MD  CHIEF COMPLAINT:  Altered mental status for a few hours.  HISTORY OF PRESENT ILLNESS:  This is a 77 year old female Caucasian female with a history of dementia, hypercholesterolemia, hypertension, hypothyroidism, and osteoarthritis, who lives at home alone, but she has two daughters who check on her on a daily basis.  Today, her daughter called on the phone around 12 midday but she did not answer the phone. The daughter had to stop by her home.  She found the patient in bed. She did not look right as per the patient's daughter.  Apparently, the patient woke up, she arranged breakfast for herself and she did check her mailbox, but then she cannot remember what happened but she denies any falls.  Denies any loss of conscious.  Her daughter asked her to lift her both arms, she could not lift her both arms, she was so weak. The daughter did not notice any slurry speech, but per the daughter, for a few seconds, she was not conversing with her children.  The patient appears to be tachypneic but per son this is normal for her.  When she was seen in the emergency room, the daughter felt she could have some right-sided droop.  They deny any seizure.  We were asked to evaluate for cause of her altered mental status.  When the patient was seen by me, she was able to move her arm and legs and she complained of numbness in her lower extremities.  She denies any nausea, vomiting, or abdominal pain.  She actually complained of chest pain 4/10 with mild shortness of breath but the family admitted that was the  patient's breathing at baseline.  The patient denies any numbness on her face.  Denies any back pain.  She felt so weak and dizzy.  She also complained of frontal headache about 7/10, nonradiating.  She had experienced one episode of vomiting, no blood.  The patient also denies any burning micturition. Denies any hematemesis or hematuria.  PAST MEDICAL HISTORY: 1. History of hypothyroidism. 2. Osteoporosis. 3. Arthritis. 4. History of renal insufficiency. 5. History of hypertension. 6. Hypercholesterolemia.  MEDICATIONS: 1. Prevacid 30 mg p.o. daily. 2. Oxybutynin XL 10 mg. 3. Synthroid 50 mcg. 4. Namenda 10 mg. 5. Losartan 100 mg. 6. Folate 800 mcg. 7. Fluoxetine 40 mg p.o. daily. 8. Calcium. 9. Aspirin. 10.Aricept.  ALLERGIES: 1. DEMEROL. 2. VALIUM.  SOCIAL HISTORY:  She quit smoking more than 20 years ago.  Denies any alcohol.  Lives at home alone.  FAMILY HISTORY:  Noncontributory.  REVIEW OF SYSTEMS:  As per HPI.  PHYSICAL EXAMINATION:  GENERAL:  The patient is not in respiratory distress.  Mild shortness of  breath.  She is not in pain.  VITAL SIGNS: Temperature 98.1, blood pressure 171/80, respiratory rate 18, pulse rate 79, oxygen saturation 100% on room air.  HEENT:  Normocephalic, atraumatic.  Pupils equal, reactive to light and accommodation.  She complained of ear pain.  Right ear without any erythema or discharge. Some dry mucosa.  LUNGS:  Diminished breath sounds, right more than the left but there are no rales or rhonchi.  HEART:  S1, S2.  No murmurs, no gallops.  The sounds are distant.  ABDOMEN:  Bowel sounds present. Abdomen is soft, nontender.  No rebound, no guarding.  EXTREMITIES:  No lower limb edema.  Peripheral pulses intact.  NEUROLOGIC:  The patient is awake and alert, oriented x3.  Cranial nerves II-XII seem intact. Power 4 and above.  I do not feel if there is any local weakness on upper or lower extremities.  Babinski's sign downgoing and  reflexes are brisk.  Sensation intact.  Gait was not examined.  RECTAL:  Not examined.  PERTINENT LABORATORY AND X-RAY DATA:  Blood workup:  White blood cells 8.6, hemoglobin 13.8, hematocrit 41.6, platelets 259.  Cardiac markers x1 negative.  CMP:  Sodium 142, potassium 3.8, chloride 108, CO2 22, glucose 98, BUN 22 and creatinine 1.5.  Urine culture negative.  CT of the head:  Chronic ischemic change.  Chest x-ray:  No acute cardiopulmonary finding.  Cardiomegaly with chronic interstitial disease.  She had an EKG on December 9, which showed A-fib but the EKG of today shows normal sinus rhythm.  Some ST-T wave abnormality, but there is no ST depression.  There is some T-wave inversion on lead V5 and V4, which was not there on 2009.  ASSESSMENT AND PLAN: 1. This is a 77 year old Caucasian female with a history of dementia,     hypertension, hypercholesterolemia, who presented with a resolved     altered mental status, possibly this was secondary to transient     ischemic attack.  Family denies any witnessed seizure.  The patient     will be admitted to the hospital to telemetry floor for evaluation.     On examination, there is no neurologic deficit.  The patient will     have neurologic check q.4 h for 24 hours.  We will get MRI of the     brain, 2-D echo, and carotid Duplex.  We will continue with the     patient's aspirin at this time.  We will get cholesterol,     hemoglobin A1C.  Blood pressure seemed elevated.  We will add a     small dose of Norvasc to the patient's medication.  We will involve     PT and OT. 2. Shortness of breath.  Chest x-ray suggests some fullness which is     unchanged.  I will get D-dimer and VQ scan, and we will check a CT     chest without contrast to evaluate the fullness at the paratracheal     area. 3. Chronic renal insufficiency:  Seems stable. 4. Mild T-wave inversion at lead V4 and V5:  I will proceed with cycle     cardiac enzyme and getting 2-D  echo. 5. Further recommendations as per the course progress.     Lawanna Cecere Bosie Helper, MD     HIE/MEDQ  D:  09/16/2010  T:  09/16/2010  Job:  [id number]  cc:   Gordy Savers, MD [address] [place] [address]  Electronically Signed  by Ebony Cargo MD on 09/20/2010 05:11:19 PM

## 2010-10-01 LAB — POCT URINALYSIS DIP (DEVICE)
Ketones, ur: NEGATIVE mg/dL
Nitrite: NEGATIVE
Protein, ur: NEGATIVE mg/dL
pH: 6.5 (ref 5.0–8.0)

## 2010-10-01 LAB — WET PREP, GENITAL
Clue Cells Wet Prep HPF POC: NONE SEEN
Trich, Wet Prep: NONE SEEN

## 2010-10-01 LAB — URINE CULTURE

## 2010-10-02 ENCOUNTER — Encounter: Payer: Self-pay | Admitting: Internal Medicine

## 2010-10-03 ENCOUNTER — Encounter: Payer: Self-pay | Admitting: Internal Medicine

## 2010-10-03 ENCOUNTER — Ambulatory Visit (INDEPENDENT_AMBULATORY_CARE_PROVIDER_SITE_OTHER): Payer: Medicare Other | Admitting: Internal Medicine

## 2010-10-03 DIAGNOSIS — R4182 Altered mental status, unspecified: Secondary | ICD-10-CM

## 2010-10-03 DIAGNOSIS — F0151 Vascular dementia with behavioral disturbance: Secondary | ICD-10-CM

## 2010-10-03 DIAGNOSIS — I10 Essential (primary) hypertension: Secondary | ICD-10-CM

## 2010-10-03 DIAGNOSIS — E039 Hypothyroidism, unspecified: Secondary | ICD-10-CM

## 2010-10-03 MED ORDER — AMLODIPINE BESYLATE 10 MG PO TABS: 10.0000 mg | ORAL_TABLET | Freq: Every day | ORAL | Status: DC

## 2010-10-03 NOTE — Patient Instructions (Signed)
Take 81 mg of aspirin daily    It is important that you exercise regularly, at least 20 minutes 3 to 4 times per week.  If you develop chest pain or shortness of breath seek  medical attention.  Please check your blood pressure on a regular basis.  If it is consistently greater than 150/90, please make an office appointment.  Limit your sodium (Salt) intake  Return in 3 months for follow-up

## 2010-10-03 NOTE — Progress Notes (Signed)
  Subjective:    Patient ID: Jennifer [name], female    DOB: [birth year], 77 y.o.   MRN: [id number]  HPI   69 -year-old patient who has a history of dementia who is seen today in hospital followup after a brief hospital admission 2 weeks ago for altered mental status. Hospital records were reviewed. Since her discharge she has done quite well after spending a couple of days with her daughter. Presently she is back living independently and doing well. She has hypertension and in the hospital. Was placed on amlodipine 10 mg daily she tolerates this medication well and has had no peripheral edema. She feels that she is back to baseline and her daughter agrees.    Review of Systems  Constitutional: Negative.   HENT: Negative for hearing loss, congestion, sore throat, rhinorrhea, dental problem, sinus pressure and tinnitus.   Eyes: Negative for pain, discharge and visual disturbance.  Respiratory: Negative for cough and shortness of breath.   Cardiovascular: Negative for chest pain, palpitations and leg swelling.  Gastrointestinal: Negative for nausea, vomiting, abdominal pain, diarrhea, constipation, blood in stool and abdominal distention.  Genitourinary: Negative for dysuria, urgency, frequency, hematuria, flank pain, vaginal bleeding, vaginal discharge, difficulty urinating, vaginal pain and pelvic pain.  Musculoskeletal: Negative for joint swelling, arthralgias and gait problem.  Skin: Negative for rash.  Neurological: Negative for dizziness, syncope, speech difficulty, weakness, numbness and headaches.  Hematological: Negative for adenopathy.  Psychiatric/Behavioral: Negative for behavioral problems, dysphoric mood and agitation. The patient is not nervous/anxious.        Objective:   Physical Exam  Constitutional: She is oriented to person, place, and time. She appears well-developed and well-nourished.  HENT:  Head: Normocephalic.  Right Ear: External ear normal.  Left Ear: External ear  normal.  Mouth/Throat: Oropharynx is clear and moist.  Eyes: Conjunctivae and EOM are normal. Pupils are equal, round, and reactive to light.  Neck: Normal range of motion. Neck supple. No thyromegaly present.  Cardiovascular: Normal rate, regular rhythm, normal heart sounds and intact distal pulses.   Pulmonary/Chest: Effort normal and breath sounds normal.  Abdominal: Soft. Bowel sounds are normal. She exhibits no mass. There is no tenderness.  Musculoskeletal: Normal range of motion.  Lymphadenopathy:    She has no cervical adenopathy.  Neurological: She is alert and oriented to person, place, and time.  Skin: Skin is warm and dry. No rash noted.  Psychiatric: She has a normal mood and affect. Her behavior is normal.          Assessment & Plan:   dementia with history of altered mental status. Now back to baseline. We'll continue her present medical regimen  Hypertension improved on amlodipine. We'll continue  Hypothyroidism.  Dyslipidemia.   A recheck in 3 months

## 2010-10-17 LAB — POCT URINALYSIS DIP (DEVICE)
Bilirubin Urine: NEGATIVE
Glucose, UA: NEGATIVE mg/dL
Nitrite: NEGATIVE

## 2010-10-17 LAB — WET PREP, GENITAL
Clue Cells Wet Prep HPF POC: NONE SEEN
Trich, Wet Prep: NONE SEEN
Yeast Wet Prep HPF POC: NONE SEEN

## 2010-10-17 LAB — URINE CULTURE

## 2010-11-21 ENCOUNTER — Other Ambulatory Visit: Payer: Self-pay | Admitting: Internal Medicine

## 2010-11-28 NOTE — Consult Note (Signed)
NAME:  Jennifer [name], Jennifer [name] NO.:  [id number]   MEDICAL RECORD NO.:  [id number]          PATIENT TYPE:  OUT   LOCATION:  XRAY                         FACILITY:  [facility]   PHYSICIAN:  Sanjeev K. Deveshwar, M.D.DATE OF BIRTH:  1928/08/13   DATE OF CONSULTATION:  DATE OF DISCHARGE:                                 CONSULTATION   DATE OF THE CONSULT:  January 22, 2008.   CHIEF COMPLAINT:  Status post sacroplasty performed on January 12, 2008.   BRIEF HISTORY:  This is a very pleasant 77 year old female who returns  today on January 22, 2008, to be seen in follow up by Dr. Corliss Skains.  The  patient was initially referred to Dr. Corliss Skains through the courtesy of  Dr. Tressie Stalker.  She was originally seen on January 06, 2008, by Dr.  Corliss Skains for evaluation of sacral fractures.  The patient had a fall in  April 2009.  She had low back pain since that time.  On January 12, 2008,  the patient had a sacroplasty performed by Dr. Corliss Skains.  She returns  today accompanied by her husband to be seen in follow up.   PAST MEDICAL HISTORY:  The patient had a laminectomy at the L3-L4 levels  performed by Dr. Lovell Sheehan in February 2009.  She later fell in April 2009  while she was recovering from her back surgery.  She has a history of  hypothyroidism, hypertension, mild dementia, osteoporosis, degenerative  joint disease, seasonal allergies, and she had a colonoscopy  approximately 7 years ago.   SURGICAL HISTORY:  The patient is status post tonsillectomy,  hysterectomy, decompressive laminectomy, rotator cuff repair, previous  hand surgery, appendectomy, and knee surgery.   ALLERGIES:  The patient is intolerant to most PAIN MEDICATIONS including  DILAUDID which causes confusion.  She also cannot take DEMEROL and  VALIUM.   CURRENT MEDICATIONS:  Allegra, Avalide, fluoxetine, hydrochloride,  Fosamax, Synthroid, Prevacid, chlordiazepoxide, hydrocodone p.r.n. for  pain, Lidoderm patches p.r.n. for  pain, and Reglan.  Please see the  previous dictated consult note for full details regarding dosages.   SOCIAL HISTORY:  The patient is married.  She and her husband have 4  adult children.  The patient and her husband live in [place].  The  patient quit smoking in 1994.  She smoked a pack of cigarettes per day  prior to that.  She does not use alcohol.  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   FAMILY HISTORY:  The patient's mother died at age 69 from colon cancer.  Her father died at age 39 from prostate cancer.  He also had dementia.   IMPRESSION:  As noted, the patient returns today to be seen in follow up  after undergoing a sacroplasty on January 12, 2008.  The patient does have  dementia and she is unfortunately a poor historian.  Her husband  provides much of the information.  Apparently, her back pain is related  to the sacral fractures and has essentially resolved.  She does state  that she is having a  burning type pain in both thigh, the right being  greater than the left.  She is having some difficulty remembering how  long this pain has been present, but hat she thinks it started after her  fall in April.   The patient also is having some upper back pain.  She believes that she  has an urinary tract infection.  She has contacted Dr. Amador Cunas  regarding this and has plans to see him in the near future.  She reports  urinary frequency at night.  She denies any dysuria, fever, or chills.   The patient is able to ambulate household distances using a walker.  She  also has a quad cane, but her husband feels that she is too unsteady  with a quad cane.  She continues to use Vicodin and Lidoderm patches for  the burning pain in her thigh.  Dr. Corliss Skains plans to discuss the  situation with Dr. Lovell Sheehan to see if he feels any further testing or  therapy is indicated for this problem.   Dr. Corliss Skains also reviewed the images from the recent sacroplasty with   the patient and her husband on the computer.  He pointed out the  fracture and showed them pre and post images from the sacroplasty.   Dr. Corliss Skains recommended a follow up visit in 4 weeks to further  evaluate the patient's back pain.  In the interim, he will talk to Dr.  Lovell Sheehan to see if Dr. Lovell Sheehan feels any different approach is indicated.   Greater than 25 minutes was spent on this follow up visit.      Delton See, P.A.    ______________________________  Grandville Silos. Corliss Skains, M.D.    DR/MEDQ  D:  01/22/2008  T:  01/23/2008  Job:  [id number]   cc:   Gordy Savers, MD  Kathrin Penner. Vear Clock, M.D.  John L. Rendall, M.D.  Cristi Loron, M.D.

## 2010-11-28 NOTE — H&P (Signed)
NAME:  Jennifer [name], Jennifer [name] NO.:  [id number]   MEDICAL RECORD NO.:  [id number]          PATIENT TYPE:  INP   LOCATION:  1233                         [facility]   PHYSICIAN:  Michiel Cowboy, MDDATE OF BIRTH:  Feb 11, 1929   DATE OF ADMISSION:  06/23/2008  DATE OF DISCHARGE:                              HISTORY & PHYSICAL   PRIMARY CARE PHYSICIAN:  Gordy Savers, M.D.   CHIEF COMPLAINT:  Abdominal pain.   HISTORY OF PRESENT ILLNESS:  The patient is a 77 year old female with  history of hypothyroidism and osteoporosis who presented with abdominal  pain for the past 2 days which was significant.  The patient herself is  currently unable to provide her own history secondary to being somewhat  somnolent after receiving p.o. and IV pain medications.  Per her  daughter, the patient initially was evaluated and was found to have a  possible urinary tract infection.  She was started on p.o. Cipro and  sent home.  Her abdominal pain persisted and became worse.  She  presented again to the emergency department and had a CT scan of her  abdomen performed which was without contrast second to her creatinine  elevation.  It showed a perihepatic fluid collection, as well as a small  pericardial effusion, at which point Dr. Everardo All was called for an  admission.  Dr. Everardo All accepted the patient to a regular for further  evaluation.  The patient was transferred from [facility]  to [employer].  On arrival to the floor, she was still having a lot of pain,  received Dilaudid which relieved her pain.  At the time of my  evaluation, the patient appears to be somewhat sleepy, but her abdominal  pain has resolved.  Per family, she has not had any chest pain or  shortness of breath.  No coughing.  No fevers, although had a little bit  of chills.  No nausea, no vomiting, no diarrhea.  Last bowel movement  was yesterday.  She has not lost any weight.  The abdominal pain onset  was sudden.   PAST MEDICAL HISTORY:  1. History of hypercholesteremia.  2. Hypertension.  3. Hypothyroidism.  4. Osteoarthritis.   SOCIAL HISTORY:  The patient never smokes or drinks.  Lives at home with  her husband who has recently had a lot of surgeries done.   FAMILY HISTORY:  Noncontributory.   ALLERGIES:  1. DEMEROL.  2. VALIUM.   MEDICATIONS:  1. Fosamax.  2. Synthroid.  Unsure of the dose.  3. Avalide.  4. Hydrocodone/acetaminophen for pain.  5. Lorazepam, dose unclear; unclear if she is still taking it.  6. Ciprofloxacin which had been started again, dose unclear.   PHYSICAL EXAMINATION:  VITALS:  Temperature 98.6, blood pressure 119/70,  heart rate 77, respirations 18, satting 97% on 3 meters.  GENERAL:  The patient appears to be currently comfortable but somewhat  sleepy and oversedated.  HEENT:  Head nontraumatic.  Somewhat dry mucous membranes.  LUNGS:  Appears to be diminished breath sounds and occasional crackles.  HEART:  Regular  rate and rhythm.  No murmurs appreciated.  The sounds  are distant.  ABDOMEN:  Distended.  Bowel sounds present.  Currently with minimal  tenderness, diffuse.  EXTREMITIES:  Lower extremities without clubbing, cyanosis or edema.  Strength appears to be normal.  NEUROLOGIC:  Intact.   LABORATORY DATA:  White blood cell count 10.2, hemoglobin 10.7.  Sodium  135, potassium 3.1, creatinine 2.5, bicarbonate 24, albumin 3.7, lipase  100.  CT scan showing free fluid around the liver, etiology unclear, and  possible small pericardial effusion, as well.  Otherwise, unremarkable.   ASSESSMENT/PLAN:  1. This is a 77 year old female with a history of hypertension,      hyperlipidemia, who now presents with fluid collection around      liver, etiology at this point unclear.  Differential includes      abscess versus blood versus more of a solid mass-like lesion.  Will      need vascular interventional radiology to perform a biopsy in  a.m.,      given that the patient has a fairly rigid abdomen and could not      rule out need for surgical intervention.  Have let Dr. Ezzard Standing that      the patient is in the hospital and asked for his help in      management.  Cover for now with Cipro plus Flagyl and obtain blood      cultures.  As per recommendation by radiology, it is felt that MRI      would not be as helpful as we can try to do an ultrasound.  The      most helpful study would be CT scan with contrast which is right      now unable to be performed, given renal insufficiency.  Will obtain      a sedimentation rate.  2. Acute renal failure.  Unsure of her baseline, but currently      creatinine elevation elevated.  Will obtain renal ultrasound and      obtain an abdominal ultrasound.  Will obtain urine lytes, give IV      hydration and see if she responds.  3. Question of pericardial effusion.  Will have a 2-D echo in a.m.      Will monitor the patient in step-down.  So far clinically does not      appear to be tamponade.  No evidence of tachycardia.  Will obtain      EKG to evaluate for evidence of pericarditis, although less likely.      No chest pain, no shortness of breath.  4. History of hypertension.  Will hold off on BP medications for right      now.  5. Hypokalemia; will replace.  6. Anemia.  Will obtain anemia panel and monitor.  7. Urinary tract infection.  This should be covered by Cipro; will      await urine culture.  8. Prophylaxis - Protonix, SCDs.  We will avoid Lovenox until sure the      patient does not have evidence of intestinal bleed.  Keep her NPO      for now.  9. The patient is full code.      Michiel Cowboy, MD  Electronically Signed     AVD/MEDQ  D:  06/23/2008  T:  06/23/2008  Job:  [id number]   cc:   Gordy Savers, MD  [address] [place]  [place] [address]

## 2010-11-28 NOTE — Op Note (Signed)
NAME:  Jennifer [name], Jennifer [name] NO.:  [id number]   MEDICAL RECORD NO.:  [id number]          PATIENT TYPE:  INP   LOCATION:  3012                         FACILITY:  [facility]   PHYSICIAN:  Cristi Loron, M.D.DATE OF BIRTH:  [birth year]   DATE OF PROCEDURE:  08/28/2007  DATE OF DISCHARGE:                               OPERATIVE REPORT   BRIEF HISTORY:  The patient is a 77 year old white female who has  suffered from back, hip, and leg pain consistent with neurogenic  claudication.  She failed medical management and was worked up with a  lumbar MRI as well as a lumbar CT which demonstrated the patient has  multifactorial spinal stenosis at L4-L5 and L5-S1 as well as lumbar  scoliosis.  I discussed the various treatment options with the patient  and her husband including surgery.  They have weighed the risks,  benefits and alternatives of surgery and decided to proceed with L3-L4  and L4-L5 decompression.   PREOPERATIVE DIAGNOSES:  L3-L4 and L4-L5 multifactorial spinal stenosis,  lumbar scoliosis, lumbago, lumbar radiculopathy/myelopathy, and disk  degeneration.   POSTOPERATIVE DIAGNOSES:  L3-L4 and L4-L5 multifactorial spinal  stenosis, lumbar scoliosis, lumbago, lumbar radiculopathy/myelopathy,  and disk degeneration.   PROCEDURE:  Bilateral L3 and L4 laminotomies and foraminotomies using  microdissection to decompress the bilateral L4 and L5 nerve roots.   SURGEON:  Cristi Loron, M.D.   ASSISTANT:  Clydene Fake, M.D.   ANESTHESIA:  General endotracheal.   ESTIMATED BLOOD LOSS:  75 cc.   SPECIMENS:  None.   DRAINS:  None.   COMPLICATIONS:  None.   DESCRIPTION OF PROCEDURE:  The patient was brought to the operating room  by the anesthesia team.  General endotracheal anesthesia was induced.  The patient was then turned to the prone position on a Wilson frame.  The lumbosacral region was then prepared with Betadine scrub and  Betadine solution.  Sterile  drapes were applied.  I then injected the  area to be incised with Marcaine with epinephrine solution and used a  scalpel to make a linear midline incision over the L3-L4 and L4-L5  interspaces.  I used electrocautery to perform a bilateral subperiosteal  dissection exposing the spinous process of the lamina at L3, L4, and L5.  We obtained intraoperative radiograph to confirm our location.  We then  inserted the [facility] retractor for exposure.  I then brought the  operative microscope into the field.  Under its magnification and  illumination, we completed the microdissection/decompression.  I used a  high-speed drill to perform bilateral L3-L4 laminotomies.  I widened  these laminotomies with Kerrison punches and removed the L3-L4 and L4-L5  ligamentum flavum bilaterally.  We then used the up-angled curettes to  remove some more redundant ligament from lateral recesses.  We performed  a foraminotomy about the bilateral L4 and L5 nerve roots completing the  decompression.  We then inspected the intervertebral disk.  They were  bulging somewhat, but there were no significant disk herniations.  At  this point, we had good decompression.  We therefore obtained hemostasis  with bipolar electrocautery.  We irrigated the wound out with bacitracin  solution, removed the retractor, and then reapproximated the patient's  thoracolumbar fascia with a interrupted #1 Vicryl suture, subcutaneous  tissue with interrupted 2-0 Vicryl suture, and the skin with Steri-  Strips and benzoin.  The wound was then coated with bacitracin ointment.  A sterile dressing was applied.  The drapes were removed.  The patient  was subsequently returned to supine position where she was extubated by  the anesthesia team and transported to the postanesthesia care unit in  stable condition.  All sponge, instrument, and needle counts correct at  the end of this case.      Cristi Loron, M.D.  Electronically  Signed     JDJ/MEDQ  D:  08/28/2007  T:  08/29/2007  Job:  [id number]

## 2010-11-28 NOTE — Consult Note (Signed)
NAME:  Jennifer [name], Jennifer [name] NO.:  [id number]   MEDICAL RECORD NO.:  [id number]          PATIENT TYPE:  OUT   LOCATION:  XRAY                         FACILITY:  [facility]   PHYSICIAN:  Sanjeev K. Deveshwar, M.D.DATE OF BIRTH:  06/22/1929   DATE OF CONSULTATION:  DATE OF DISCHARGE:                                 CONSULTATION   Date of consultation January 06, 2008.   CHIEF COMPLAINT:  Sacral insufficiency fracture.   HISTORY OF PRESENT ILLNESS:  This is a very pleasant 76 year old female  who had a fall in April 2009.  She has had low back pain as well as pain  radiating down her legs since that time.  She was referred to Dr.  Corliss Skains through the courtesy of Dr. Lovell Sheehan for evaluation and  treatment of the sacral insufficiency fractures.  She had a CT scan of  the pelvis on December 16, 2007, that revealed no hip fracture, but was  positive for bilateral sacral ala insufficiency fractures.  She also had  an MRI of the lumbar spine on Nov 18, 2007, which again showed the sacral  fractures.   The patient reports that she has been in severe pain.  She rates her  pain as an 8 on 1/10 scale.  It is worse with activity.  She is now  using a walker to ambulate, prior to this she was fairly active around  the house.  She had undergone a laminectomy at the L3-L4 levels  performed by Dr. Lovell Sheehan in February of this year.  She had been  recovering from this surgery and ambulating with a cane when she had her  fall in April.  Again, since her fall she has been very dependent on her  husband, who has to assist with her care.  The patient states the only  time she is pain free is when she is in bed.   PAST MEDICAL HISTORY:  Significant for hypothyroidism, hypertension,  mild dementia, osteoporosis, degenerative joint disease, seasonal  allergies, and she had a colonoscopy approximately 7 years ago.   SURGICAL HISTORY:  The patient is status post tonsillectomy, status post  hysterectomy.   She had a decompressive laminectomy at the L3-L4 levels  on August 28, 2007, performed by Dr. Lovell Sheehan.  She has a history of  rotator cuff repair, previous hand surgery, appendectomy, and knee  surgery.  She reports no previous problems with anesthesia.   ALLERGIES:  The patient is intolerant to many pain medications, DILAUDID  cause confusion, she cannot take DEMEROL or VALIUM.   CURRENT MEDICATIONS:  Include,  1. Allegra 180 mg once daily.  2. Avalide 150/12.5 once daily.  3. Fluoxetine hydrochloride 40 mg once daily.  4. Fosamax 70 mg once each week.  5. Synthroid 50 mcg daily.  6. Prevacid 30 mg daily.  7. Chlordiazepoxide 1 twice daily p.r.n.  8. Hydrocodone p.r.n. for pain.  9. Reglan 5 mg 30 minutes before each meal and at bedtime and she is      also on Lidoderm patches.  The patient reports that she has not had any  significant relief of her  pain with her pain medications.   SOCIAL HISTORY:  The patient is married.  She and her husband have 4  adult children.  The patient and her husband live in [place].  She  quit smoking in 1994.  She smoked a pack of cigarettes per day prior to  that.  She does not use alcohol.  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   FAMILY HISTORY:  Mother died at age 16 from colon cancer.  Her father  died at age 48 from prostate cancer.  He also had dementia.   IMPRESSION AND PLAN:  As noted, the patient presents today for further  evaluation and to discuss treatment options regarding her sacral  fractures.  Dr. Corliss Skains reviewed the results of the CT scan and the  MRI with the patient and her husband.  He showed them the images of the  fracture.  The sacroplasty procedure was described in detail along with  the risks and benefits.  The patient is anxious to proceed for relief of  pain and stabilization of the fracture.  We have scheduled her for this  coming Monday.   Greater than 40 minutes were spent on this  consult.      Delton See, P.A.    ______________________________  Grandville Silos. Corliss Skains, M.D.    DR/MEDQ  D:  01/06/2008  T:  01/07/2008  Job:  [id number]   cc:   Gordy Savers, MD  Kathrin Penner. Vear Clock, M.D.  John L. Rendall, M.D.  Cristi Loron, M.D.

## 2010-11-28 NOTE — Discharge Summary (Signed)
NAME:  Jennifer [name], Jennifer [name] NO.:  [id number]   MEDICAL RECORD NO.:  [id number]          PATIENT TYPE:  INP   LOCATION:  3012                         FACILITY:  [facility]   PHYSICIAN:  Cristi Loron, M.D.DATE OF BIRTH:  02-25-1929   DATE OF ADMISSION:  08/28/2007  DATE OF DISCHARGE:  09/01/2007                               DISCHARGE SUMMARY   BRIEF HISTORY:  The patient is a 77 year old white female who presented  with symptoms consistent with nausea and claudication.  She was worked  up with an MRI scan which demonstrates lumbar stenosis.  I discussed  various treatment options with the patient including surgery.  She has  weighed the risks, benefits, and alternatives of surgery and decided to  proceed with a decompressive laminectomy.   For further details of this admission, please refer to the typed history  and physical.   HOSPITAL COURSE:  I admitted the patient to [facility] on  August 28, 2007.  On the day of admission, I performed a decompressive  laminectomy at L3 and L4 to treat the spinal stenosis at L3-4 and L4-5.  The surgery went well. (For full details of this operation, please refer  to the typed operative note.)   POSTOPERATIVE COURSE:  The patient's postoperative course was  unremarkable, and she was discharged home on September 01, 2007.   FINAL DIAGNOSIS:  Lumbar spinal stenosis.   PROCEDURE PERFORMED:  Decompressive laminectomy at L3 and L4 using  microdissection.   DISCHARGE PRESCRIPTIONS:  The patient was given prescription for:  1. Valium 2 mg #50 one p.o. q.8 h. for muscle spasm.  2. Percocet 10/325 mg #50 one p.o. q.4 h. p.r.n. for pain.   The patient was given written discharge instructions, instructed to  follow up with me in 4 weeks.   FINAL DIAGNOSIS:  Lumbar stenosis.      Cristi Loron, M.D.  Electronically Signed     JDJ/MEDQ  D:  09/18/2007  T:  09/19/2007  Job:  [id number]

## 2010-11-28 NOTE — Consult Note (Signed)
NAME:  Jennifer, MCAULIFFE NO.:  [id number]   MEDICAL RECORD NO.:  [id number]          PATIENT TYPE:  INP   LOCATION:  1233                         FACILITY:  [facility]   PHYSICIAN:  Sandria Bales. Ezzard Standing, M.D.  DATE OF BIRTH:  September 20, 1928   DATE OF CONSULTATION:  DATE OF DISCHARGE:                                 CONSULTATION   Date of consultation ???   HISTORY:  Jennifer [name] is a 77 year old white female who is a patient of  Dr. Eleonore Chiquito.   CONSULTING PHYSICIAN:  Michiel Cowboy, MD   REASON FOR CONSULTATION:  Left liver mass.   HISTORY OF PRESENT ILLNESS:  Jennifer [name] is a 77 year old white female  patient of Dr. Eleonore Chiquito, who has maybe about a 1 day history or  2 day history of vague pain.  Saw Dr. Amador Cunas on Monday, June 21, 2008 and was treated for urinary tract infection.  However, she then  became more ill.  Her daughter brought her to the [facility]  Emergency Room where they evaluated her.  She was then transferred to  Dr. Adela Glimpse.  I was called by Dr. Adela Glimpse, because on a CT scan done at  the [facility] ER, they raised a question of a left lobe liver  lesion.   Jennifer [name], by interest, her husband was just in the ICU at [facility] for a month from complications of what sounded like possible  hip surgery.  She has 2 daughters in the room with her, Sharyne Richters  and Sharyne Peach, who gave a majority of her history.   She has had no history of peptic ulcer disease, known liver disease,  pancreatic disease or colon disease.  She has had prior colonoscopies by  Dr. Ewing Schlein.  The timing of the last colonoscopy, somewhat unclear.  Her  only prior abdominal surgery, she did have a hysterectomy in 1974 and  she has had an open appendectomy about 1950.   PAST MEDICAL HISTORY:   ALLERGIES:  SHE IS ALLERGIC TO DEMEROL AND VALIUM.   ADMISSION MEDICATIONS:  1. Fosamax.  2. Synthroid.  3. Adalat.  4. Hydrocodone.  5.  Lorazepam.  6. Propoxyphene and then she was just recently started on Cipro.   REVIEW OF SYSTEMS:  NEUROLOGIC:  She has had no seizures or loss of  consciousness.  PULMONARY:  Does not smoke cigarettes.  CARDIAC:  She had been hypertensive, on hypertensive medicines, but no  heart attack she is aware of.  GASTROINTESTINAL:  See history of present illness.  UROLOGIC:  She has had some kidney infection before, but never  hospitalized for any.  MUSCULOSKELETAL:  She has actually had laminectomies by Dr. Delma Officer  on August 28, 2007 and then she thinks had a sacroplasty by Dr.  Corliss Skains after a fall, I think this summer.   SOCIAL HISTORY:  She lives with her husband.  Her husband right now is  in a nursing home.  She is actually living by herself.  She is a little  confused, at least on my  exam and talking to her.  Again, she has 2 of  her daughters here in the room with her.   PHYSICAL EXAMINATION:  VITAL SIGNS:  Temperature is 98.6.  Pulse is 100.  Blood pressure 164/55.  HEENT:  Remarkable.  NECK:  Supple.  I feel no mass, no thyromegaly.  LUNGS:  Clear to auscultation.  HEART:  Her heart has a regular rate and rhythm without murmur or rub.  ABDOMEN:  Her abdomen is mildly distended.  She has some mild soreness,  tenderness over her epigastrium.  It does not lateralize well, but she  has active bowel sounds.  She has no guarding or rebound.  EXTREMITIES:  She moves her extremities again.  She is in a bed right  now.  Appears fairly weak.   LABORATORY:  Her labs that I have, show sodium 136, potassium .2,  chloride of 98, CO2 of 30, BUN 38, creatinine 2.7.  Her total protein is  6.6.  Albumin is 2.8.  Liver functions are normal.  SGOT, SGPT.  Bilirubin 0.4.  Her troponin is 0.02.  Her CK MB is 2.4.  Her hemoglobin  is 10, hematocrit 31, white blood count of 9,300 with 73% neutrophils,  19% lymphocytes.  Her sed rate was 90.  Her BNP is 164.   I reviewed her CT scan and  discussed it with Dr. Leanna Battles.  she  does appear to have some mass effect of her left lobe of her liver,  which is somewhat atrophic.  The exact etiology of this is somewhat  clear, but it is hard to image how this is tied to her acute illness.  Dr. Fredirick Lathe, in talking to her, thinks she would probably best be served  with an MRI.  There are some limits to what Jennifer [name] can have because  of her acute renal problems, but there is no other evidence of  significant intra-abdominal problem that requires surgery at this time.   DIAGNOSES:  1. Vague density of the left lobe of the liver.  This probably can be      better defined with an magnetic resonance imaging, either during      this hospitalization or after discharge.  Right now this does not      appear to be part of her acute event, but could be pursued while      she is here.  2. Abdominal pain.  It is unclear what is causing this, whether it is      due to her kidney infection or not is unclear.  On CT scan there is      no obvious cause for abdominal pain.  3. Elevated sed rate, consistent with some inflammatory response.  4. Acute renal failure, creatinine of 2.78.  Of note, she had a      creatinine of 1.7 in June of 2009.  5. Urinary tract infection.  She is on antibiotics.  She has white      blood counts greater than 25 in her urine with bacteria too      numerous to count.  6. She has a pericardial effusion.  Echocardiograms are ordered.  7. She is hypertensive,  but the meds are being held, which seems      appropriate.  8. She had a history of prior back surgery, laminectomy, where she is      still struggling with pain, I think.      Sandria Bales. Ezzard Standing, M.D.  Electronically Signed  DHN/MEDQ  D:  06/23/2008  T:  06/23/2008  Job:  [id number]   cc:   Gordy Savers, MD  [address] [place]  [place] [address]   Michiel Cowboy, MD   Petra Kuba, M.D.  Fax: [phone]

## 2010-11-28 NOTE — Discharge Summary (Signed)
NAME:  Jennifer, [name] NO.:  [id number]   MEDICAL RECORD NO.:  [id number]          PATIENT TYPE:  INP   LOCATION:  1302                         FACILITY:  [facility]   PHYSICIAN:  Jennifer [name], MDDATE OF BIRTH:  Dec 02, 1928   DATE OF ADMISSION:  06/23/2008  DATE OF DISCHARGE:  06/26/2008                               DISCHARGE SUMMARY   PRIMARY CARE PHYSICIAN:  Dr. Derryl Harbor.   DISCHARGE DIAGNOSES:  1. Abdominal pain in setting of Escherichia coli urinary tract      infection.  2. Acute renal insufficiency.  3. Anemia with iron and B12 deficiency.  4. Perihepatic fluid collection, seen in consultation by surgery      during this admission.   HISTORY OF PRESENT ILLNESS:  Jennifer [name] is a 77 year old white female  with past medical history of hypertension and high cholesterol who  presented to [facility] Emergency Room on day of admission with reports  of 2-day history of abdominal pain.  Per patient's daughter at time of  admission, the patient was seen in the emergency room initially and  diagnosed with urinary tract infection at which time she was discharged  home on Cipro.  The patient's abdominal pain persisted, becoming  increasingly worse.  CT scan of the abdomen and pelvis done at time of  admission was without contrast secondary to elevated creatinine revealed  perihepatic fluid collection.  The patient was admitted at that time for  further evaluation and treatment.   PAST MEDICAL HISTORY:  1. Hypercholesterolemia.  2. Hypertension.  3. Hypothyroidism.  4. Osteoarthritis.   CONSULTATIONS DURING THIS ADMISSION:  [facility], Dr.  Ovidio Kin.   COURSE OF HOSPITALIZATION:  1. Abdominal pain secondary to E. coli UTI.  Again, patient seen      earlier in the week in the emergency room and sent home on empiric      Cipro for urinary tract infection.  However, abdominal pain became      increasingly worse.  Urine culture  obtained during this admission      revealed greater than 100,000 colonies of E. coli resistant to      Cipro.  The patient was started on Rocephin.  She responded well      with decreased abdominal pain as well as decrease in white blood      cell count and felt medically stable for discharge home on oral      antibiotics on 06/26/2008.  2. Perihepatic fluid collection.  Did call [facility] at      time of admission as there was question of whether or not      perihepatic fluid was contributing to patient's abdominal pain.      Dr. Ezzard Standing felt it is likely that CT findings were causing      patient's symptoms.  He felt area could be better defined with MRI,      however, not indicated during this hospitalization.  Patient      instructed to follow up for further workup.  However, findings      likely  incidental.  3. Anemia.  Patient with mild anemia during hospitalization with      anemia panel revealing iron and B12 deficiency.  We will defer      supplement therapy to patient's primary care physician at      discharge.   MEDICATIONS AT TIME OF DISCHARGE:  1. Fosamax 70 mg p.o. weekly.  2. Synthroid 50 mcg p.o. daily.  3. Avapro 150 mg p.o. daily.  4. Prevacid 30 mg p.o. daily.  5. Prozac 40 mg p.o. daily.  6. Allegra 180 mg p.o. daily.  7. Macrobid 100 mg p.o. b.i.d. x5 days.   PERTINENT LAB WORK AT TIME OF DISCHARGE:  White cell count 10.2,  platelet count 273, hemoglobin 9.7, hematocrit 29.3.  Sodium 138,  potassium 4.0, BUN 14, creatinine 1.63.  Urine culture 30,000 colonies  of E. coli resistant to Cipro.  Blood cultures x2 obtained on December  9th negative for any growth.  Anemia panel revealing iron of 32, vitamin  B12 of 179, percent saturation of 11.   DISPOSITION:  Patient felt medically stable for discharge home at this  time as abdominal pain has resolved with appropriate treatment of  urinary tract infection.  Patient is instructed to follow up with  her  primary care physician, Dr. Derryl Harbor in 3-5 days postdischarge.  Family will call office to schedule this appointment.      Jennifer Pen, NP      Jennifer [name]. Jennifer Coyer, MD  Electronically Signed    LE/MEDQ  D:  07/28/2008  T:  07/29/2008  Job:  [id number]   cc:   Gordy Savers, MD  [address] [place]  [place] [address]

## 2010-12-01 NOTE — Assessment & Plan Note (Signed)
[facility] OFFICE NOTE   GAIA, GULLIKSON                       MRN:          [id number]  DATE:09/12/2006                            DOB:          1929-03-01    The patient is a 77 year old Caucasian female seen today for an annual  exam.  She has hypertension, osteoporosis, hypothyroidism, DJD.  She is  recovering from a URI, otherwise doing quite well.  She has a history of  seasonal allergic rhinitis.  Review of system exam is fairly negative.  Did have colonoscopy about 6 years ago.   FAMILY HISTORY:  Pertinent for a mother with history of colon cancer.   EXAM:  A well-developed, healthy appearing female in no acute distress.  FUNDI:  Well visualized and negative.  EAR, NOSE AND THROAT:  Clear.  NECK:  No bruits.  CHEST:  Clear.  BREASTS:  Negative.  CARDIOVASCULAR:  Normal heart sounds, no murmurs.  ABDOMEN:  Benign, no organomegaly, no bruits appreciated.  PELVIC:  Absent uterus, no adnexal masses.  STOOL:  Heme test negative.  EXTREMITIES:  No edema.  Peripheral pulses were not easily palpable.  NEURO:  Negative.   IMPRESSION:  1. Hypertension.  2. Degenerative joint disease.  3. Osteoporosis.   DISPOSITION:  Medical regimen unchanged.  Will reassess in 3-4 months.     Gordy Savers, MD  Electronically Signed    PFK/MedQ  DD: 09/12/2006  DT: 09/12/2006  Job #: [id number]

## 2010-12-01 NOTE — Procedures (Signed)
[place]  Patient:    Jennifer [name], Jennifer [name]                       MRN: [id number] Proc. Date: 10/01/00 Adm. Date:  [id number] Attending:  Nelda Marseille CC:         Gordy Savers, M.D.   Procedure Report  PROCEDURE:  Colonoscopy with biopsy.  INDICATION:  Patient with strong family history of colon cancer, due for repeat screening.  Consent was signed after risks, benefits, methods, and options thoroughly discussed multiple times in the past.  MEDICATIONS:  Per the anesthesia department, fentanyl 150 mcg, diprivan 70 mg.  DESCRIPTION OF PROCEDURE:  Rectal inspection was pertinent for small external hemorrhoids.  Digital exam was negative.  The pediatric video colonoscope was inserted fairly easily despite a tortuous sigmoid filled with diverticula and advanced to the cecum.  This did require rolling her on her back, using abdominal pressure.  In the ascending colon was some questionable linear AVMs versus friable mucosa.  The cecum was identified by the appendiceal orifice and the ileocecal valve.  Prep on the right side was fair with some stool adherent to the wall, which required lots of washing and suctioning.  The rest of the prep was adequate.  On slow withdrawal through the colon, other than the questionable AVMs mentioned above, the cecum, ascending, and transverse and the majority of the descending were normal.  Significant amount of left-sided diverticula were seen.  In the midsigmoid, a tiny 2 mm polyp was seen and was cold biopsied x 2.  It seemed hyperplastic-appearing.  No other polypoid lesions or masses were seen as we slowly withdrew back to the rectum. Once back in the rectum, the scope was retroflexed, pertinent for some internal hemorrhoids.  The scope was straightened, the air was withdrawn, and the scope removed.  The patient tolerated the procedure well.  There was no obvious immediate complication.  ENDOSCOPIC  DIAGNOSES: 1. Internal-external hemorrhoids. 2. Left-sided diverticula. 3. One small sigmoid polyp, status post cold biopsy. 4. Questionable ascending arteriovenous malformations versus friable colon. 5. Otherwise within normal limits to the cecum.  PLAN:  Await pathology, but probably recheck again in five years unless needed sooner p.r.n.  Will go ahead and try MiraLax and see her back in two to three months to see how that works for her constipation, possibly try Wildwood next.  Happy to see her back sooner p.r.n. DD:  10/01/00 TD:  10/01/00 Job: [id number]

## 2010-12-01 NOTE — Op Note (Signed)
NAME:  Jennifer [name], Jennifer [name]                          ACCOUNT NO.:  [id number]   MEDICAL RECORD NO.:  [id number]                   PATIENT TYPE:  AMB   LOCATION:  DSC                                  FACILITY:  [facility]   PHYSICIAN:  Artist Pais. Mina Marble, M.D.           DATE OF BIRTH:  [birth year]   DATE OF PROCEDURE:  03/01/2004  DATE OF DISCHARGE:                                 OPERATIVE REPORT   PREOPERATIVE DIAGNOSIS:  Right thumb carpometacarpal arthritis.   POSTOPERATIVE DIAGNOSIS:  Right thumb carpometacarpal arthritis.   PROCEDURE:  Right thumb carpometacarpal suspensionplasty.   SURGEON:  Artist Pais. Mina Marble, M.D.   ASSISTANT:  Aura Fey. Bobbe Medico.   ANESTHESIA:  General.   TOURNIQUET TIME:  Seventy minutes.   COMPLICATIONS:  No complications.   No drains.   OPERATIVE REPORT:  The patient was taken to the operating room and after the  induction adequate general anesthesia, her right upper extremity was prepped  and draped in the usual sterile fashion.  An Esmarch was used to  exsanguinate the limb and tourniquet was inflated to 250 mmHg.  At this  point in time, a J-shaped incision was made over the thenar eminence of the  right thumb and a large volarly based flap was elevated.  The thenar muscles  were stripped off the [facility] joint and the [facility] joint was entered, and a  trapeziectomy was performed using a combination of curettes, rongeurs and  osteotomes.  Once the trapeziectomy was performed, a CMC synovectomy was  performed, followed by creation of a transosseous canal in the thumb  metacarpal base and through a second incision dorsally at the index  metacarpal base, a second transosseous canal was created using curettes,  rongeurs and hand drills.  Once this was done, a third incision was made  over the first dorsal compartment at the musculotendinous junction with the  APL tendon.  The APL tendon was sectioned at the musculotendinous junction  and drawn into the original  wound.  At this point in time, the EHL tendon  was passed from dorsal-to-volar through the thumb metacarpal and from volar-  to-dorsal out the index metacarpal and tied via a Pulvertaft weave into the  extensor carpi radialis and brevis insertion, thus completing the  suspension.  All 3 wounds were thoroughly irrigated.  The thenar muscles  were repaired with 4-0 Vicryl and all 3 skin incisions were repaired with 3-  0 Prolene subcuticular stitches.  Steri-Strips, 4 x 4's, fluffs, a  compressive dressing and a radial gutter splint were applied.  The patient  tolerated the procedure well and went to recovery room in stable fashion.                                               Molli Hazard  Corky Downs, M.D.    MAW/MEDQ  D:  03/01/2004  T:  03/01/2004  Job:  [id number]

## 2010-12-22 ENCOUNTER — Other Ambulatory Visit: Payer: Self-pay | Admitting: Internal Medicine

## 2011-02-28 ENCOUNTER — Other Ambulatory Visit: Payer: Self-pay | Admitting: Internal Medicine

## 2011-03-15 ENCOUNTER — Other Ambulatory Visit: Payer: Self-pay | Admitting: Internal Medicine

## 2011-03-22 ENCOUNTER — Other Ambulatory Visit: Payer: Self-pay | Admitting: Internal Medicine

## 2011-04-06 LAB — CBC
HCT: 36.7
Hemoglobin: 12.3
MCHC: 33.8
MCV: 91.4
RBC: 4.01
WBC: 6.9

## 2011-04-06 LAB — BASIC METABOLIC PANEL
Chloride: 100
GFR calc Af Amer: 34 — ABNORMAL LOW
Potassium: 4.1
Sodium: 138

## 2011-04-12 LAB — BASIC METABOLIC PANEL
CO2: 26
Calcium: 8.7
Chloride: 105
GFR calc Af Amer: 35 — ABNORMAL LOW
Potassium: 4.1
Sodium: 137

## 2011-04-12 LAB — PROTIME-INR: INR: 0.9

## 2011-04-12 LAB — CBC
HCT: 31.1 — ABNORMAL LOW
Hemoglobin: 10.3 — ABNORMAL LOW
MCHC: 33.2
RBC: 3.47 — ABNORMAL LOW

## 2011-04-13 ENCOUNTER — Other Ambulatory Visit: Payer: Self-pay | Admitting: Internal Medicine

## 2011-04-20 LAB — BASIC METABOLIC PANEL
BUN: 14 mg/dL (ref 6–23)
Chloride: 97 mEq/L (ref 96–112)
GFR calc non Af Amer: 28 mL/min — ABNORMAL LOW (ref >60)
GFR calc non Af Amer: 31 mL/min — ABNORMAL LOW (ref >60)
Glucose, Bld: 120 mg/dL — ABNORMAL HIGH (ref 70–99)
Glucose, Bld: 132 mg/dL — ABNORMAL HIGH (ref 70–99)
Potassium: 3.6 mEq/L (ref 3.5–5.1)
Potassium: 4 mEq/L (ref 3.5–5.1)
Sodium: 136 mEq/L (ref 135–145)

## 2011-04-20 LAB — URINE CULTURE
Colony Count: 30000
Special Requests: POSITIVE

## 2011-04-20 LAB — FOLATE: Folate: 10.1 ng/mL

## 2011-04-20 LAB — URINE MICROSCOPIC-ADD ON

## 2011-04-20 LAB — CBC
HCT: 30.3 % — ABNORMAL LOW (ref 36.0–46.0)
HCT: 30.6 % — ABNORMAL LOW (ref 36.0–46.0)
HCT: 31.7 % — ABNORMAL LOW (ref 36.0–46.0)
HCT: 32.4 % — ABNORMAL LOW (ref 36.0–46.0)
Hemoglobin: 10.3 g/dL — ABNORMAL LOW (ref 12.0–15.0)
Hemoglobin: 10.5 g/dL — ABNORMAL LOW (ref 12.0–15.0)
Hemoglobin: 9.8 g/dL — ABNORMAL LOW (ref 12.0–15.0)
MCHC: 32.5 g/dL (ref 30.0–36.0)
MCHC: 33 g/dL (ref 30.0–36.0)
MCHC: 33.8 g/dL (ref 30.0–36.0)
MCV: 88.8 fL (ref 78.0–100.0)
MCV: 91 fL (ref 78.0–100.0)
MCV: 91.5 fL (ref 78.0–100.0)
Platelets: 232 10*3/uL (ref 150–400)
Platelets: 251 10*3/uL (ref 150–400)
Platelets: 273 10*3/uL (ref 150–400)
RBC: 3.31 MIL/uL — ABNORMAL LOW (ref 3.87–5.11)
RBC: 3.36 MIL/uL — ABNORMAL LOW (ref 3.87–5.11)
RBC: 3.65 MIL/uL — ABNORMAL LOW (ref 3.87–5.11)
RDW: 13.9 % (ref 11.5–15.5)
RDW: 14.2 % (ref 11.5–15.5)
RDW: 14.6 % (ref 11.5–15.5)
RDW: 14.6 % (ref 11.5–15.5)
WBC: 11.8 10*3/uL — ABNORMAL HIGH (ref 4.0–10.5)
WBC: 6.5 10*3/uL (ref 4.0–10.5)
WBC: 9.3 10*3/uL (ref 4.0–10.5)

## 2011-04-20 LAB — LIPASE, BLOOD: Lipase: 100 U/L (ref 23–300)

## 2011-04-20 LAB — DIFFERENTIAL
Basophils Absolute: 0 10*3/uL (ref 0.0–0.1)
Basophils Absolute: 0 10*3/uL (ref 0.0–0.1)
Basophils Relative: 0 % (ref 0–1)
Eosinophils Absolute: 0 10*3/uL (ref 0.0–0.7)
Eosinophils Relative: 0 % (ref 0–5)
Eosinophils Relative: 0 % (ref 0–5)
Lymphocytes Relative: 12 % (ref 12–46)
Lymphocytes Relative: 19 % (ref 12–46)
Lymphs Abs: 1.2 10*3/uL (ref 0.7–4.0)
Lymphs Abs: 1.8 10*3/uL (ref 0.7–4.0)
Monocytes Absolute: 0.7 10*3/uL (ref 0.1–1.0)
Monocytes Relative: 7 % (ref 3–12)
Neutro Abs: 6.8 10*3/uL (ref 1.7–7.7)
Neutro Abs: 8.3 10*3/uL — ABNORMAL HIGH (ref 1.7–7.7)
Neutrophils Relative %: 73 % (ref 43–77)
Neutrophils Relative %: 81 % — ABNORMAL HIGH (ref 43–77)

## 2011-04-20 LAB — COMPREHENSIVE METABOLIC PANEL
AST: 24 U/L (ref 0–37)
AST: 38 U/L — ABNORMAL HIGH (ref 0–37)
Albumin: 2.8 g/dL — ABNORMAL LOW (ref 3.5–5.2)
BUN: 38 mg/dL — ABNORMAL HIGH (ref 6–23)
BUN: 43 mg/dL — ABNORMAL HIGH (ref 6–23)
CO2: 24 mEq/L (ref 19–32)
Calcium: 9.6 mg/dL (ref 8.4–10.5)
Creatinine, Ser: 2.5 mg/dL — ABNORMAL HIGH (ref 0.4–1.2)
Creatinine, Ser: 2.78 mg/dL — ABNORMAL HIGH (ref 0.4–1.2)
GFR calc Af Amer: 20 mL/min — ABNORMAL LOW (ref >60)
GFR calc Af Amer: 23 mL/min — ABNORMAL LOW (ref >60)
GFR calc non Af Amer: 19 mL/min — ABNORMAL LOW (ref >60)
Potassium: 3.2 mEq/L — ABNORMAL LOW (ref 3.5–5.1)
Total Bilirubin: 0.5 mg/dL (ref 0.3–1.2)
Total Protein: 6.6 g/dL (ref 6.0–8.3)

## 2011-04-20 LAB — IRON AND TIBC
Iron: 32 ug/dL — ABNORMAL LOW (ref 42–135)
Saturation Ratios: 11 % — ABNORMAL LOW (ref 20–55)
TIBC: 300 ug/dL (ref 250–470)
UIBC: 268 ug/dL

## 2011-04-20 LAB — URINALYSIS, ROUTINE W REFLEX MICROSCOPIC
Nitrite: POSITIVE — AB
Specific Gravity, Urine: 1.015 (ref 1.005–1.030)
Urobilinogen, UA: 1 mg/dL (ref 0.0–1.0)
pH: 6 (ref 5.0–8.0)

## 2011-04-20 LAB — CK TOTAL AND CKMB (NOT AT ARMC)
CK, MB: 2.4 ng/mL (ref 0.3–4.0)
Relative Index: INVALID (ref 0.0–2.5)
Total CK: 91 U/L (ref 7–177)

## 2011-04-20 LAB — CULTURE, BLOOD (ROUTINE X 2)
Culture: NO GROWTH
Culture: NO GROWTH

## 2011-04-20 LAB — SODIUM, URINE, RANDOM: Sodium, Ur: 39 mEq/L

## 2011-04-20 LAB — RETICULOCYTES
RBC.: 3.62 MIL/uL — ABNORMAL LOW (ref 3.87–5.11)
Retic Count, Absolute: 32.6 10*3/uL (ref 19.0–186.0)
Retic Ct Pct: 0.9 % (ref 0.4–3.1)

## 2011-04-20 LAB — APTT: aPTT: 31 seconds (ref 24–37)

## 2011-04-20 LAB — SEDIMENTATION RATE: Sed Rate: 90 mm/hr — ABNORMAL HIGH (ref 0–22)

## 2011-04-20 LAB — TROPONIN I: Troponin I: 0.02 ng/mL (ref 0.00–0.06)

## 2011-04-20 LAB — FERRITIN: Ferritin: 69 ng/mL (ref 10–291)

## 2011-04-20 LAB — B-NATRIURETIC PEPTIDE (CONVERTED LAB): Pro B Natriuretic peptide (BNP): 164 pg/mL — ABNORMAL HIGH (ref 0.0–100.0)

## 2011-05-04 ENCOUNTER — Other Ambulatory Visit: Payer: Self-pay | Admitting: Gastroenterology

## 2011-06-22 ENCOUNTER — Other Ambulatory Visit: Payer: Self-pay | Admitting: Internal Medicine

## 2011-08-24 ENCOUNTER — Encounter: Payer: Medicare Other | Admitting: Internal Medicine

## 2011-09-04 ENCOUNTER — Ambulatory Visit (INDEPENDENT_AMBULATORY_CARE_PROVIDER_SITE_OTHER): Payer: Medicare Other | Admitting: Internal Medicine

## 2011-09-04 ENCOUNTER — Encounter: Payer: Self-pay | Admitting: Internal Medicine

## 2011-09-04 DIAGNOSIS — M1990 Unspecified osteoarthritis, unspecified site: Secondary | ICD-10-CM

## 2011-09-04 DIAGNOSIS — Z0000 Encounter for general adult medical examination without abnormal findings: Secondary | ICD-10-CM

## 2011-09-04 DIAGNOSIS — F411 Generalized anxiety disorder: Secondary | ICD-10-CM

## 2011-09-04 DIAGNOSIS — E876 Hypokalemia: Secondary | ICD-10-CM

## 2011-09-04 DIAGNOSIS — G609 Hereditary and idiopathic neuropathy, unspecified: Secondary | ICD-10-CM

## 2011-09-04 DIAGNOSIS — E039 Hypothyroidism, unspecified: Secondary | ICD-10-CM

## 2011-09-04 DIAGNOSIS — D509 Iron deficiency anemia, unspecified: Secondary | ICD-10-CM

## 2011-09-04 DIAGNOSIS — F0390 Unspecified dementia without behavioral disturbance: Secondary | ICD-10-CM

## 2011-09-04 DIAGNOSIS — E785 Hyperlipidemia, unspecified: Secondary | ICD-10-CM

## 2011-09-04 DIAGNOSIS — R4181 Age-related cognitive decline: Secondary | ICD-10-CM

## 2011-09-04 DIAGNOSIS — D649 Anemia, unspecified: Secondary | ICD-10-CM

## 2011-09-04 DIAGNOSIS — I10 Essential (primary) hypertension: Secondary | ICD-10-CM

## 2011-09-04 DIAGNOSIS — L299 Pruritus, unspecified: Secondary | ICD-10-CM

## 2011-09-04 DIAGNOSIS — R4182 Altered mental status, unspecified: Secondary | ICD-10-CM

## 2011-09-04 DIAGNOSIS — J309 Allergic rhinitis, unspecified: Secondary | ICD-10-CM

## 2011-09-04 DIAGNOSIS — R413 Other amnesia: Secondary | ICD-10-CM

## 2011-09-04 DIAGNOSIS — G629 Polyneuropathy, unspecified: Secondary | ICD-10-CM

## 2011-09-04 LAB — COMPREHENSIVE METABOLIC PANEL
ALT: 17 U/L (ref 0–35)
AST: 24 U/L (ref 0–37)
Albumin: 3.9 g/dL (ref 3.5–5.2)
Calcium: 10.1 mg/dL (ref 8.4–10.5)
Chloride: 107 mEq/L (ref 96–112)
Potassium: 4 mEq/L (ref 3.5–5.1)
Sodium: 142 mEq/L (ref 135–145)
Total Protein: 7 g/dL (ref 6.0–8.3)

## 2011-09-04 LAB — CBC WITH DIFFERENTIAL/PLATELET
Basophils Absolute: 0 10*3/uL (ref 0.0–0.1)
Eosinophils Absolute: 0.1 10*3/uL (ref 0.0–0.7)
Lymphocytes Relative: 29.8 % (ref 12.0–46.0)
MCHC: 33.4 g/dL (ref 30.0–36.0)
Monocytes Absolute: 0.3 10*3/uL (ref 0.1–1.0)
Neutro Abs: 3.8 10*3/uL (ref 1.4–7.7)
Neutrophils Relative %: 62.4 % (ref 43.0–77.0)
RDW: 14.1 % (ref 11.5–14.6)

## 2011-09-04 LAB — LIPID PANEL
Total CHOL/HDL Ratio: 4
VLDL: 16.6 mg/dL (ref 0.0–40.0)

## 2011-09-04 LAB — TSH: TSH: 4.79 u[IU]/mL (ref 0.35–5.50)

## 2011-09-04 MED ORDER — FLUOXETINE HCL 40 MG PO CAPS: 40.0000 mg | ORAL_CAPSULE | Freq: Every day | ORAL | Status: DC

## 2011-09-04 MED ORDER — LOSARTAN POTASSIUM 100 MG PO TABS: 100.0000 mg | ORAL_TABLET | Freq: Every day | ORAL | Status: DC

## 2011-09-04 MED ORDER — GABAPENTIN 100 MG PO CAPS: 100.0000 mg | ORAL_CAPSULE | Freq: Three times a day (TID) | ORAL | Status: DC

## 2011-09-04 MED ORDER — MEMANTINE HCL 10 MG PO TABS: 10.0000 mg | ORAL_TABLET | Freq: Two times a day (BID) | ORAL | Status: DC

## 2011-09-04 MED ORDER — DONEPEZIL HCL 5 MG PO TABS: 5.0000 mg | ORAL_TABLET | Freq: Every day | ORAL | Status: DC

## 2011-09-04 MED ORDER — LANSOPRAZOLE 30 MG PO CPDR: 30.0000 mg | DELAYED_RELEASE_CAPSULE | Freq: Every day | ORAL | Status: DC

## 2011-09-04 MED ORDER — HYDROCODONE-ACETAMINOPHEN 5-500 MG PO TABS: 1.0000 | ORAL_TABLET | Freq: Four times a day (QID) | ORAL | Status: DC | PRN

## 2011-09-04 MED ORDER — AMLODIPINE BESYLATE 10 MG PO TABS: 10.0000 mg | ORAL_TABLET | Freq: Every day | ORAL | Status: DC

## 2011-09-04 MED ORDER — OXYBUTYNIN CHLORIDE ER 10 MG PO TB24: 10.0000 mg | ORAL_TABLET | Freq: Every day | ORAL | Status: DC

## 2011-09-04 MED ORDER — LEVOTHYROXINE SODIUM 50 MCG PO TABS: 50.0000 ug | ORAL_TABLET | Freq: Every day | ORAL | Status: DC

## 2011-09-04 MED ORDER — FEXOFENADINE HCL 180 MG PO TABS: 180.0000 mg | ORAL_TABLET | Freq: Every day | ORAL | Status: DC

## 2011-09-04 NOTE — Progress Notes (Signed)
Subjective:    Patient ID: Jennifer [name], female    DOB: 03-Mar-1929, 78 y.o.   MRN: [id number]  HPI  30 -year-old patient who is seen today for an annual exam. She is accompanied by her daughter and seems to be fairly stable. Her only complaint today is low back pain this has been an intermittent problem over the years.  1. Risk factors, based on past  M,S,F history- cardiovascular risk factors include hypertension.  2.  Physical activities: Walks with a cane. Has chronic low back pain that limits her activities  3.  Depression/mood: No history of depression or mood disorder. She does have a history of vascular dementia with delusions  4.  Hearing: No major deficits  5.  ADL's: Requires some assistance with all aspects of daily living due to her chronic back pain  6.  Fall risk: Moderate due to chronic back pain and general debility. Walks with a cane  7.  Home safety: No problems identified  8.  Height weight, and visual acuity; height and weight stable no change in visual acuity 9.  Counseling: Heart healthy diet or regular exercise encouraged 10. Lab orders based on risk factors: Laboratory profile including lipid panel and thyroid studies will be reviewed  11. Referral : Not appropriate at this time  12. Care plan: Continue present medications which include Aricept and Namenda  13. Cognitive assessment: Alert and oriented with normal affect mild cognitive impairment    Current Allergies:  VALIUM (DIAZEPAM)  DEMEROL (MEPERIDINE HCL)   Past Medical History:  Reviewed history from 06/30/2008 and no changes required:  Hypertension  Osteoporosis  DJD  Hypothyroidism  Allergic rhinitis,Seasonale  GERD  Low back pain  Osteoarthritis  UTI  Anxiety  Anemia-iron deficiency   Family History:  Reviewed history from 06/04/2007 and no changes required:  [place] History of CAD Female 1st degree relative <50  Family History Hypertension  Family History Other cancer  Family  History of Suicide attempt  father died age 64, prostate cancer, coronary artery disease  mother died age 14, colon cancer  3 brothers two sisters  for hypertension, hypercholesterolemia   Social History:  Reviewed history from 06/21/2008 and no changes required:  Married  husband presently at [facility] is receiving physical therapy  Widow/Widower January 2010     Review of Systems  Constitutional: Negative for fever, appetite change, fatigue and unexpected weight change.  HENT: Negative for hearing loss, ear pain, nosebleeds, congestion, sore throat, mouth sores, trouble swallowing, neck stiffness, dental problem, voice change, sinus pressure and tinnitus.   Eyes: Negative for photophobia, pain, redness and visual disturbance.  Respiratory: Negative for cough, chest tightness and shortness of breath.   Cardiovascular: Negative for chest pain, palpitations and leg swelling.  Gastrointestinal: Negative for nausea, vomiting, abdominal pain, diarrhea, constipation, blood in stool, abdominal distention and rectal pain.  Genitourinary: Negative for dysuria, urgency, frequency, hematuria, flank pain, vaginal bleeding, vaginal discharge, difficulty urinating, genital sores, vaginal pain, menstrual problem and pelvic pain.  Musculoskeletal: Positive for back pain, arthralgias and gait problem.  Skin: Negative for rash.  Neurological: Negative for dizziness, syncope, speech difficulty, weakness, light-headedness, numbness and headaches.  Hematological: Negative for adenopathy. Does not bruise/bleed easily.  Psychiatric/Behavioral: Negative for suicidal ideas, behavioral problems, self-injury, dysphoric mood and agitation. The patient is not nervous/anxious.        Objective:   Physical Exam  Constitutional: She is oriented to person, place, and time. She appears well-developed and well-nourished.  HENT:  Head: Normocephalic and atraumatic.  Right Ear: External ear normal.  Left Ear:  External ear normal.  Mouth/Throat: Oropharynx is clear and moist.       Upper dentures in place  Eyes: Conjunctivae and EOM are normal.  Neck: Normal range of motion. Neck supple. No JVD present. No thyromegaly present.  Cardiovascular: Normal rate, regular rhythm and normal heart sounds.   No murmur heard.      Pedal pulses not easily palpable but no ischemic changes noted  Pulmonary/Chest: Effort normal and breath sounds normal. She has no wheezes. She has no rales.  Abdominal: Soft. Bowel sounds are normal. She exhibits no distension and no mass. There is no tenderness. There is no rebound and no guarding.  Musculoskeletal: Normal range of motion. She exhibits no edema and no tenderness.  Neurological: She is alert and oriented to person, place, and time. She has normal reflexes. No cranial nerve deficit. She exhibits normal muscle tone. Coordination normal.       Decreased vibratory sensation involving the lower extremities  Skin: Skin is warm and dry. No rash noted.  Psychiatric: She has a normal mood and affect. Her behavior is normal.          Assessment & Plan:   Preventive health exam Hypertension stable Hypothyroidism. We'll check a TSH History of iron deficiency anemia we'll check a CBC as her arthritis and chronic back pain Mild dementia. We'll check a vitamin D level due to her diminished vibratory sensation and dementia  Recheck 6 months

## 2011-09-04 NOTE — Patient Instructions (Signed)
Limit your sodium (Salt) intake    It is important that you exercise regularly, at least 20 minutes 3 to 4 times per week.  If you develop chest pain or shortness of breath seek  medical attention.  You need to lose weight.  Consider a lower calorie diet and regular exercise.  Return in 6 months for follow-up   

## 2011-09-14 ENCOUNTER — Telehealth: Payer: Self-pay | Admitting: Internal Medicine

## 2011-09-14 NOTE — Telephone Encounter (Signed)
Please advise 

## 2011-09-14 NOTE — Telephone Encounter (Signed)
Pts daughter called and said that her mother still having back pain. Pain med is not working. Pt may need to get an xray ordered. Pls call.

## 2011-10-23 ENCOUNTER — Other Ambulatory Visit: Payer: Self-pay | Admitting: Internal Medicine

## 2011-10-23 ENCOUNTER — Encounter: Payer: Self-pay | Admitting: Internal Medicine

## 2011-10-23 ENCOUNTER — Ambulatory Visit (INDEPENDENT_AMBULATORY_CARE_PROVIDER_SITE_OTHER): Payer: Medicare Other | Admitting: Internal Medicine

## 2011-10-23 VITALS — BP 120/80 | Wt 165.0 lb

## 2011-10-23 DIAGNOSIS — M1990 Unspecified osteoarthritis, unspecified site: Secondary | ICD-10-CM

## 2011-10-23 DIAGNOSIS — I10 Essential (primary) hypertension: Secondary | ICD-10-CM

## 2011-10-23 DIAGNOSIS — M545 Low back pain: Secondary | ICD-10-CM

## 2011-10-23 MED ORDER — FENTANYL 25 MCG/HR TD PT72: 1.0000 | MEDICATED_PATCH | TRANSDERMAL | Status: AC

## 2011-10-23 NOTE — Progress Notes (Signed)
  Subjective:    Patient ID: Jennifer [name], female    DOB: 07/28/1928, 78 y.o.   MRN: [id number]  HPI  Wt Readings from Last 3 Encounters:  10/23/11 165 lb (74.844 kg)  09/04/11 166 lb (75.297 kg)  10/03/10 161 lb (73.029 kg)    Review of Systems     Objective:   Physical Exam        Assessment & Plan:

## 2011-10-23 NOTE — Progress Notes (Signed)
  Subjective:    Patient ID: Jennifer [name], female    DOB: 07/13/1929, 78 y.o.   MRN: [id number]  HPI  78 year old patient who has a long history of chronic low back pain she has had laminectomies in the past and also a kyphoplasty chest significant lumbar osteoarthritis. For the past couple months she has had worsening low back pain which has been refractory to anti-inflammatory medications as well as hydrocodone. Pain interferes with sleep;  no constitutional symptoms. Pain is aggravated by movement    Review of Systems  Constitutional: Negative.   HENT: Negative for hearing loss, congestion, sore throat, rhinorrhea, dental problem, sinus pressure and tinnitus.   Eyes: Negative for pain, discharge and visual disturbance.  Respiratory: Negative for cough and shortness of breath.   Cardiovascular: Negative for chest pain, palpitations and leg swelling.  Gastrointestinal: Negative for nausea, vomiting, abdominal pain, diarrhea, constipation, blood in stool and abdominal distention.  Genitourinary: Negative for dysuria, urgency, frequency, hematuria, flank pain, vaginal bleeding, vaginal discharge, difficulty urinating, vaginal pain and pelvic pain.  Musculoskeletal: Positive for back pain, arthralgias and gait problem. Negative for joint swelling.  Skin: Negative for rash.  Neurological: Negative for dizziness, syncope, speech difficulty, weakness, numbness and headaches.  Hematological: Negative for adenopathy.  Psychiatric/Behavioral: Negative for behavioral problems, dysphoric mood and agitation. The patient is not nervous/anxious.        Objective:   Physical Exam  Musculoskeletal: Normal range of motion. She exhibits tenderness. She exhibits no edema.       Tenderness over the lumbar spine. Straight leg testing is normal range of motion of both hips appear to be intact          Assessment & Plan:   Chronic low back pain with acute exacerbation. Pain is refractory to  anti-inflammatory medications as well as hydrocodone. Options discussed elected to place on fentanyl patch at low dose. We'll recheck in 4 weeks

## 2011-10-23 NOTE — Patient Instructions (Signed)
Use a  pain patch every 72 hours  Call or return to clinic prn if these symptoms worsen or fail to improve as anticipated.

## 2011-11-02 ENCOUNTER — Telehealth: Payer: Self-pay | Admitting: Internal Medicine

## 2011-11-02 DIAGNOSIS — M79606 Pain in leg, unspecified: Secondary | ICD-10-CM

## 2011-11-02 DIAGNOSIS — M545 Low back pain: Secondary | ICD-10-CM

## 2011-11-02 DIAGNOSIS — M25559 Pain in unspecified hip: Secondary | ICD-10-CM

## 2011-11-02 NOTE — Telephone Encounter (Signed)
orders placed - pt instructed on Saturday clinic and Er if pain worsens over weekend -  Will make rov for day after xrays completed. Pt aware walkin - at [facility] - when done call and will make appt for Tuesday. KIK

## 2011-11-02 NOTE — Telephone Encounter (Signed)
Pt is aware waiting on MD °

## 2011-11-02 NOTE — Telephone Encounter (Signed)
LS spine series. Schedule office visit early next week for titration of pain medication. Offer appointment Saturday clinic if desired

## 2011-11-02 NOTE — Telephone Encounter (Signed)
Patient called stating that the meds she was given are not working as she is having severe pain up her left leg to her hip and would like to know what she can do about it. Please advise.

## 2011-11-02 NOTE — Telephone Encounter (Signed)
LS spine series Office visit next week for pain  Med titration Ultrasound or a clinic appointment if desired

## 2011-11-02 NOTE — Telephone Encounter (Signed)
Please advise 

## 2011-11-05 ENCOUNTER — Ambulatory Visit (INDEPENDENT_AMBULATORY_CARE_PROVIDER_SITE_OTHER)
Admission: RE | Admit: 2011-11-05 | Discharge: 2011-11-05 | Disposition: A | Discharge status: Home / Self Care | Payer: Medicare Other | Source: Ambulatory Visit | Attending: Internal Medicine | Admitting: Internal Medicine

## 2011-11-05 DIAGNOSIS — M25559 Pain in unspecified hip: Secondary | ICD-10-CM

## 2011-11-05 DIAGNOSIS — M79606 Pain in leg, unspecified: Secondary | ICD-10-CM

## 2011-11-05 DIAGNOSIS — M545 Low back pain: Secondary | ICD-10-CM

## 2011-11-05 DIAGNOSIS — M79609 Pain in unspecified limb: Secondary | ICD-10-CM

## 2011-11-06 ENCOUNTER — Telehealth: Payer: Self-pay | Admitting: *Deleted

## 2011-11-06 NOTE — Telephone Encounter (Signed)
Redgie Grayer (pt's daughter) is asking to be called with her Mom's xray results.

## 2011-11-07 ENCOUNTER — Telehealth: Payer: Self-pay | Admitting: *Deleted

## 2011-11-07 DIAGNOSIS — M545 Low back pain: Secondary | ICD-10-CM

## 2011-11-07 NOTE — Telephone Encounter (Signed)
Pt's daughter thinks the name of the doctor that she was speaking to Dr. Kirtland Bouchard about was Dr. Lovell Sheehan???

## 2011-11-07 NOTE — Telephone Encounter (Signed)
Please advise on CT results.

## 2011-11-08 ENCOUNTER — Telehealth: Payer: Self-pay

## 2011-11-08 NOTE — Telephone Encounter (Signed)
Pt would like a referral to Dr. Jenkins

## 2011-11-08 NOTE — Telephone Encounter (Signed)
Error

## 2011-11-08 NOTE — Telephone Encounter (Signed)
Please refer due to worsening low back pain

## 2011-11-08 NOTE — Telephone Encounter (Signed)
Please check to see if patient would like a referral to Dr. Lovell Sheehan

## 2011-11-08 NOTE — Telephone Encounter (Signed)
Please advise on dx - and the doctor is Peggye Ley? neurosurg?

## 2011-11-12 ENCOUNTER — Telehealth: Payer: Self-pay | Admitting: *Deleted

## 2011-11-12 DIAGNOSIS — M549 Dorsalgia, unspecified: Secondary | ICD-10-CM

## 2011-11-12 NOTE — Telephone Encounter (Signed)
Need more details, and if it needs to be stat.  With or without contrast, and diagnosis.  Where does he want to have it done?  Thanks Selena Batten

## 2011-11-12 NOTE — Telephone Encounter (Signed)
Please schedule lumbar MRI as soon as possible

## 2011-11-12 NOTE — Telephone Encounter (Signed)
Please schedule lumbar MRI as soon as possible 

## 2011-11-12 NOTE — Telephone Encounter (Signed)
Pt's daughter is upset at the amount of pain Mom is in, and would like to get the MRI first before waiting for results.  Hopefully if the MRI is showing something significant, we can get her in sooner.

## 2011-11-12 NOTE — Telephone Encounter (Signed)
Order done

## 2011-11-12 NOTE — Telephone Encounter (Signed)
Notified [name] that MRI is in the works.

## 2011-11-14 ENCOUNTER — Ambulatory Visit
Admission: RE | Admit: 2011-11-14 | Discharge: 2011-11-14 | Disposition: A | Discharge status: Home / Self Care | Payer: Medicare Other | Source: Ambulatory Visit | Attending: Internal Medicine | Admitting: Internal Medicine

## 2011-11-14 ENCOUNTER — Other Ambulatory Visit: Payer: Self-pay | Admitting: Internal Medicine

## 2011-11-14 DIAGNOSIS — M549 Dorsalgia, unspecified: Secondary | ICD-10-CM

## 2011-11-14 DIAGNOSIS — M545 Low back pain: Secondary | ICD-10-CM

## 2011-11-14 DIAGNOSIS — R937 Abnormal findings on diagnostic imaging of other parts of musculoskeletal system: Secondary | ICD-10-CM

## 2011-11-14 NOTE — Progress Notes (Signed)
Quick Note:  Spoke with daughter [name] - informed of dr. Vernon Prey instructions and order to be done. KIK ______

## 2011-12-18 ENCOUNTER — Other Ambulatory Visit: Payer: Self-pay

## 2011-12-18 MED ORDER — HYDROCODONE-ACETAMINOPHEN 5-500 MG PO TABS: 1.0000 | ORAL_TABLET | Freq: Four times a day (QID) | ORAL | Status: DC | PRN

## 2011-12-18 NOTE — Telephone Encounter (Signed)
Fax refill request for vicodin [phone] from [facility] Last seen 10/23/11 back pain Last written 09/04/11 # [phone] Please advise

## 2011-12-18 NOTE — Telephone Encounter (Signed)
Ok  RF 1 

## 2011-12-24 ENCOUNTER — Other Ambulatory Visit: Payer: Self-pay | Admitting: Neurosurgery

## 2011-12-26 ENCOUNTER — Encounter (HOSPITAL_COMMUNITY)
Admission: RE | Admit: 2011-12-26 | Discharge: 2011-12-26 | Disposition: A | Discharge status: Home / Self Care | Payer: Medicare Other | Source: Ambulatory Visit | Attending: Anesthesiology | Admitting: Anesthesiology

## 2011-12-26 ENCOUNTER — Encounter (HOSPITAL_COMMUNITY): Payer: Self-pay

## 2011-12-26 ENCOUNTER — Encounter (HOSPITAL_COMMUNITY): Payer: Self-pay | Admitting: Respiratory Therapy

## 2011-12-26 ENCOUNTER — Encounter (HOSPITAL_COMMUNITY)
Admission: RE | Admit: 2011-12-26 | Discharge: 2011-12-26 | Disposition: A | Discharge status: Home / Self Care | Payer: Medicare Other | Source: Ambulatory Visit | Attending: Neurosurgery | Admitting: Neurosurgery

## 2011-12-26 LAB — SURGICAL PCR SCREEN: Staphylococcus aureus: NEGATIVE

## 2011-12-26 LAB — BASIC METABOLIC PANEL
CO2: 28 mEq/L (ref 19–32)
Calcium: 10.4 mg/dL (ref 8.4–10.5)
Chloride: 103 mEq/L (ref 96–112)
Sodium: 143 mEq/L (ref 135–145)

## 2011-12-26 LAB — CBC
MCV: 91.4 fL (ref 78.0–100.0)
Platelets: 313 10*3/uL (ref 150–400)
RBC: 4.43 MIL/uL (ref 3.87–5.11)
WBC: 11.5 10*3/uL — ABNORMAL HIGH (ref 4.0–10.5)

## 2011-12-26 NOTE — Pre-Procedure Instructions (Addendum)
[address]  12/26/2011   Your procedure is scheduled on:  Monday, June 17th.  Report to [facility] at 10:45 AM.  Call this number if you have problems the morning of surgery: [phone]   Remember:   Do not eat food or drink any liquid after midnight.   Take these medicines the morning of surgery with A SIP OF WATER: Amlodipine (Norvasc), Fexofenadine (Allerga), Fluoxetine (Prozac), Hydrocodone- acetaminophen (Vicodin),  Levothyroxine (Synthyroid) an Lansorprazole (Prevacid).  Do not wear jewelry, make-up or nail polish.  Do not wear lotions, powders, or perfumes. You may wear deodorant.  Do not shave 48 hours prior to surgery. Men may shave face and neck.  Do not bring valuables to the hospital.  Contacts, dentures or bridgework may not be worn into surgery.  Leave suitcase in the car. After surgery it may be brought to your room.  For patients admitted to the hospital, checkout time is 11:00 AM the day of discharge.   Patients discharged the day of surgery will not be allowed to drive home.  Name and phone number of your driver: NA   Special Instructions: CHG Shower Use Special Wash: 1/2 bottle night before surgery and 1/2 bottle morning of surgery.   Please read over the following fact sheets that you were given: Pain Booklet, Coughing and Deep Breathing, MRSA Information and Surgical Site Infection Prevention

## 2011-12-27 ENCOUNTER — Other Ambulatory Visit: Payer: Self-pay | Admitting: Internal Medicine

## 2011-12-31 ENCOUNTER — Encounter (HOSPITAL_COMMUNITY): Payer: Self-pay | Admitting: Anesthesiology

## 2011-12-31 ENCOUNTER — Ambulatory Visit (HOSPITAL_COMMUNITY): Payer: Medicare Other | Admitting: Anesthesiology

## 2011-12-31 ENCOUNTER — Encounter (HOSPITAL_COMMUNITY)
Admission: RE | Disposition: A | Discharge status: Home / Self Care | Payer: Self-pay | Source: Ambulatory Visit | Attending: Neurosurgery

## 2011-12-31 ENCOUNTER — Encounter (HOSPITAL_COMMUNITY): Payer: Self-pay | Admitting: *Deleted

## 2011-12-31 ENCOUNTER — Ambulatory Visit (HOSPITAL_COMMUNITY): Payer: Medicare Other

## 2011-12-31 ENCOUNTER — Inpatient Hospital Stay (HOSPITAL_COMMUNITY)
Admission: RE | Admit: 2011-12-31 | Discharge: 2012-01-01 | DRG: 491 | Disposition: A | Discharge status: Home / Self Care | Payer: Medicare Other | Source: Ambulatory Visit | Attending: Neurosurgery | Admitting: Neurosurgery

## 2011-12-31 DIAGNOSIS — E039 Hypothyroidism, unspecified: Secondary | ICD-10-CM | POA: Diagnosis present

## 2011-12-31 DIAGNOSIS — I10 Essential (primary) hypertension: Secondary | ICD-10-CM | POA: Diagnosis present

## 2011-12-31 DIAGNOSIS — K219 Gastro-esophageal reflux disease without esophagitis: Secondary | ICD-10-CM | POA: Diagnosis present

## 2011-12-31 DIAGNOSIS — M5126 Other intervertebral disc displacement, lumbar region: Secondary | ICD-10-CM

## 2011-12-31 HISTORY — PX: LUMBAR LAMINECTOMY/DECOMPRESSION MICRODISCECTOMY: SHX5026

## 2011-12-31 SURGERY — LUMBAR LAMINECTOMY/DECOMPRESSION MICRODISCECTOMY 1 LEVEL
Anesthesia: General | Laterality: Left | Wound class: Clean

## 2011-12-31 MED ORDER — HYDROMORPHONE HCL PF 1 MG/ML IJ SOLN
0.2500 mg | INTRAMUSCULAR | Status: DC | PRN
  Administered 2011-12-31: 0.5 mg via INTRAVENOUS
  Administered 2011-12-31 (×2): 0.25 mg via INTRAVENOUS
  Administered 2011-12-31: 0.5 mg via INTRAVENOUS

## 2011-12-31 MED ORDER — BUPIVACAINE-EPINEPHRINE PF 0.5-1:200000 % IJ SOLN
INTRAMUSCULAR | Status: DC | PRN
  Administered 2011-12-31: 10 mL

## 2011-12-31 MED ORDER — ONDANSETRON HCL 4 MG/2ML IJ SOLN: 4.0000 mg | Freq: Once | INTRAMUSCULAR | Status: DC | PRN

## 2011-12-31 MED ORDER — HYDROMORPHONE HCL PF 1 MG/ML IJ SOLN
INTRAMUSCULAR | Status: AC
  Filled 2011-12-31: qty 1

## 2011-12-31 MED ORDER — HEMOSTATIC AGENTS (NO CHARGE) OPTIME
TOPICAL | Status: DC | PRN
  Administered 2011-12-31: 1 via TOPICAL

## 2011-12-31 MED ORDER — MEMANTINE HCL 10 MG PO TABS
10.0000 mg | ORAL_TABLET | Freq: Two times a day (BID) | ORAL | Status: DC
  Administered 2011-12-31: 10 mg via ORAL
  Filled 2011-12-31 (×3): qty 1

## 2011-12-31 MED ORDER — HYDROCODONE-ACETAMINOPHEN 5-325 MG PO TABS: 1.0000 | ORAL_TABLET | ORAL | Status: DC | PRN

## 2011-12-31 MED ORDER — PANTOPRAZOLE SODIUM 40 MG PO TBEC: 40.0000 mg | DELAYED_RELEASE_TABLET | Freq: Every day | ORAL | Status: DC

## 2011-12-31 MED ORDER — CEFAZOLIN SODIUM 1-5 GM-% IV SOLN
INTRAVENOUS | Status: AC
  Administered 2011-12-31: 2 g via INTRAVENOUS
  Filled 2011-12-31: qty 50

## 2011-12-31 MED ORDER — FENTANYL CITRATE 0.05 MG/ML IJ SOLN
INTRAMUSCULAR | Status: DC | PRN
  Administered 2011-12-31: 50 ug via INTRAVENOUS
  Administered 2011-12-31: 100 ug via INTRAVENOUS

## 2011-12-31 MED ORDER — LACTATED RINGERS IV SOLN
INTRAVENOUS | Status: DC | PRN
  Administered 2011-12-31 (×2): via INTRAVENOUS

## 2011-12-31 MED ORDER — MORPHINE SULFATE 2 MG/ML IJ SOLN: 1.0000 mg | INTRAMUSCULAR | Status: DC | PRN

## 2011-12-31 MED ORDER — DOCUSATE SODIUM 100 MG PO CAPS
100.0000 mg | ORAL_CAPSULE | Freq: Two times a day (BID) | ORAL | Status: DC
  Administered 2011-12-31: 100 mg via ORAL
  Filled 2011-12-31: qty 1

## 2011-12-31 MED ORDER — FLUOXETINE HCL 40 MG PO CAPS: 40.0000 mg | ORAL_CAPSULE | Freq: Every day | ORAL | Status: DC

## 2011-12-31 MED ORDER — NEOSTIGMINE METHYLSULFATE 1 MG/ML IJ SOLN
INTRAMUSCULAR | Status: DC | PRN
  Administered 2011-12-31: 3 mg via INTRAVENOUS

## 2011-12-31 MED ORDER — DONEPEZIL HCL 5 MG PO TABS: 5.0000 mg | ORAL_TABLET | Freq: Every day | ORAL | Status: DC

## 2011-12-31 MED ORDER — SODIUM CHLORIDE 0.9 % IR SOLN
Status: DC | PRN
  Administered 2011-12-31: 15:00:00

## 2011-12-31 MED ORDER — DONEPEZIL HCL 5 MG PO TABS
5.0000 mg | ORAL_TABLET | Freq: Every day | ORAL | Status: DC
  Filled 2011-12-31 (×2): qty 1

## 2011-12-31 MED ORDER — 0.9 % SODIUM CHLORIDE (POUR BTL) OPTIME
TOPICAL | Status: DC | PRN
  Administered 2011-12-31: 1000 mL

## 2011-12-31 MED ORDER — THROMBIN 5000 UNITS EX SOLR
CUTANEOUS | Status: DC | PRN
  Administered 2011-12-31 (×2): 5000 [IU] via TOPICAL

## 2011-12-31 MED ORDER — LOSARTAN POTASSIUM 50 MG PO TABS
100.0000 mg | ORAL_TABLET | Freq: Every day | ORAL | Status: DC
  Administered 2011-12-31: 100 mg via ORAL
  Filled 2011-12-31 (×2): qty 2

## 2011-12-31 MED ORDER — LIDOCAINE HCL (CARDIAC) 20 MG/ML IV SOLN
INTRAVENOUS | Status: DC | PRN
  Administered 2011-12-31: 50 mg via INTRAVENOUS

## 2011-12-31 MED ORDER — OXYBUTYNIN CHLORIDE ER 10 MG PO TB24
10.0000 mg | ORAL_TABLET | Freq: Every day | ORAL | Status: DC
  Administered 2011-12-31: 10 mg via ORAL
  Filled 2011-12-31 (×2): qty 1

## 2011-12-31 MED ORDER — CEFAZOLIN SODIUM-DEXTROSE 2-3 GM-% IV SOLR
2.0000 g | Freq: Three times a day (TID) | INTRAVENOUS | Status: AC
  Administered 2011-12-31 – 2012-01-01 (×2): 2 g via INTRAVENOUS
  Filled 2011-12-31 (×2): qty 50

## 2011-12-31 MED ORDER — PROPOFOL 10 MG/ML IV BOLUS
INTRAVENOUS | Status: DC | PRN
  Administered 2011-12-31: 110 mg via INTRAVENOUS

## 2011-12-31 MED ORDER — ACETAMINOPHEN 325 MG PO TABS: 650.0000 mg | ORAL_TABLET | ORAL | Status: DC | PRN

## 2011-12-31 MED ORDER — LORATADINE 10 MG PO TABS
10.0000 mg | ORAL_TABLET | Freq: Every day | ORAL | Status: DC
  Filled 2011-12-31 (×2): qty 1

## 2011-12-31 MED ORDER — FLUOXETINE HCL 20 MG PO CAPS
40.0000 mg | ORAL_CAPSULE | Freq: Every day | ORAL | Status: DC
  Filled 2011-12-31: qty 2

## 2011-12-31 MED ORDER — OXYCODONE-ACETAMINOPHEN 5-325 MG PO TABS
1.0000 | ORAL_TABLET | ORAL | Status: DC | PRN
  Administered 2011-12-31: 1 via ORAL
  Administered 2012-01-01 (×2): 2 via ORAL
  Filled 2011-12-31: qty 2
  Filled 2011-12-31: qty 1
  Filled 2011-12-31: qty 2

## 2011-12-31 MED ORDER — ONDANSETRON HCL 4 MG/2ML IJ SOLN: 4.0000 mg | INTRAMUSCULAR | Status: DC | PRN

## 2011-12-31 MED ORDER — CYCLOBENZAPRINE HCL 10 MG PO TABS
5.0000 mg | ORAL_TABLET | Freq: Three times a day (TID) | ORAL | Status: DC | PRN
  Administered 2011-12-31: 5 mg via ORAL
  Filled 2011-12-31: qty 1

## 2011-12-31 MED ORDER — PHENOL 1.4 % MT LIQD: 1.0000 | OROMUCOSAL | Status: DC | PRN

## 2011-12-31 MED ORDER — LEVOTHYROXINE SODIUM 50 MCG PO TABS
50.0000 ug | ORAL_TABLET | Freq: Every day | ORAL | Status: DC
  Administered 2012-01-01: 50 ug via ORAL
  Filled 2011-12-31 (×2): qty 1

## 2011-12-31 MED ORDER — VECURONIUM BROMIDE 10 MG IV SOLR
INTRAVENOUS | Status: DC | PRN
  Administered 2011-12-31: 5 mg via INTRAVENOUS

## 2011-12-31 MED ORDER — PHENYLEPHRINE HCL 10 MG/ML IJ SOLN
INTRAMUSCULAR | Status: DC | PRN
  Administered 2011-12-31 (×7): 80 ug via INTRAVENOUS

## 2011-12-31 MED ORDER — MENTHOL 3 MG MT LOZG: 1.0000 | LOZENGE | OROMUCOSAL | Status: DC | PRN

## 2011-12-31 MED ORDER — LACTATED RINGERS IV SOLN: INTRAVENOUS | Status: DC

## 2011-12-31 MED ORDER — BACITRACIN 50000 UNITS IM SOLR
INTRAMUSCULAR | Status: AC
  Filled 2011-12-31: qty 1

## 2011-12-31 MED ORDER — SODIUM CHLORIDE 0.9 % IV SOLN
INTRAVENOUS | Status: AC
  Filled 2011-12-31: qty 500

## 2011-12-31 MED ORDER — GLYCOPYRROLATE 0.2 MG/ML IJ SOLN
INTRAMUSCULAR | Status: DC | PRN
  Administered 2011-12-31: 0.4 mg via INTRAVENOUS
  Administered 2011-12-31: 0.2 mg via INTRAVENOUS

## 2011-12-31 MED ORDER — ONDANSETRON HCL 4 MG/2ML IJ SOLN
INTRAMUSCULAR | Status: DC | PRN
  Administered 2011-12-31: 4 mg via INTRAVENOUS

## 2011-12-31 MED ORDER — AMLODIPINE BESYLATE 10 MG PO TABS
10.0000 mg | ORAL_TABLET | Freq: Every day | ORAL | Status: DC
  Administered 2011-12-31: 10 mg via ORAL
  Filled 2011-12-31 (×2): qty 1

## 2011-12-31 MED ORDER — ACETAMINOPHEN 650 MG RE SUPP: 650.0000 mg | RECTAL | Status: DC | PRN

## 2011-12-31 SURGICAL SUPPLY — 52 items
BAG DECANTER FOR FLEXI CONT (MISCELLANEOUS) ×2 IMPLANT
BENZOIN TINCTURE PRP APPL 2/3 (GAUZE/BANDAGES/DRESSINGS) ×2 IMPLANT
BLADE SURG ROTATE 9660 (MISCELLANEOUS) IMPLANT
BRUSH SCRUB EZ PLAIN DRY (MISCELLANEOUS) ×2 IMPLANT
BUR ACORN 6.0 (BURR) ×2 IMPLANT
BUR MATCHSTICK NEURO 3.0 LAGG (BURR) ×2 IMPLANT
CANISTER SUCTION 2500CC (MISCELLANEOUS) ×2 IMPLANT
CLOTH BEACON ORANGE TIMEOUT ST (SAFETY) ×2 IMPLANT
CONT SPEC 4OZ CLIKSEAL STRL BL (MISCELLANEOUS) ×2 IMPLANT
DRAPE LAPAROTOMY 100X72X124 (DRAPES) ×2 IMPLANT
DRAPE MICROSCOPE LEICA (MISCELLANEOUS) ×2 IMPLANT
DRAPE POUCH INSTRU U-SHP 10X18 (DRAPES) ×2 IMPLANT
DRAPE SURG 17X23 STRL (DRAPES) ×8 IMPLANT
ELECT BLADE 4.0 EZ CLEAN MEGAD (MISCELLANEOUS) ×2
ELECT REM PT RETURN 9FT ADLT (ELECTROSURGICAL) ×2
ELECTRODE BLDE 4.0 EZ CLN MEGD (MISCELLANEOUS) ×1 IMPLANT
ELECTRODE REM PT RTRN 9FT ADLT (ELECTROSURGICAL) ×1 IMPLANT
GAUZE SPONGE 4X4 16PLY XRAY LF (GAUZE/BANDAGES/DRESSINGS) IMPLANT
GLOVE BIO SURGEON STRL SZ8.5 (GLOVE) ×2 IMPLANT
GLOVE BIOGEL PI IND STRL 7.0 (GLOVE) ×1 IMPLANT
GLOVE BIOGEL PI INDICATOR 7.0 (GLOVE) ×1
GLOVE ECLIPSE 7.0 STRL STRAW (GLOVE) ×2 IMPLANT
GLOVE EXAM NITRILE LRG STRL (GLOVE) IMPLANT
GLOVE EXAM NITRILE MD LF STRL (GLOVE) IMPLANT
GLOVE EXAM NITRILE XL STR (GLOVE) IMPLANT
GLOVE EXAM NITRILE XS STR PU (GLOVE) IMPLANT
GLOVE SS BIOGEL STRL SZ 8 (GLOVE) ×1 IMPLANT
GLOVE SUPERSENSE BIOGEL SZ 8 (GLOVE) ×1
GOWN BRE IMP SLV AUR LG STRL (GOWN DISPOSABLE) IMPLANT
GOWN BRE IMP SLV AUR XL STRL (GOWN DISPOSABLE) ×4 IMPLANT
GOWN STRL REIN 2XL LVL4 (GOWN DISPOSABLE) IMPLANT
KIT BASIN OR (CUSTOM PROCEDURE TRAY) ×2 IMPLANT
KIT ROOM TURNOVER OR (KITS) ×2 IMPLANT
NEEDLE HYPO 21X1.5 SAFETY (NEEDLE) IMPLANT
NEEDLE HYPO 22GX1.5 SAFETY (NEEDLE) ×2 IMPLANT
NS IRRIG 1000ML POUR BTL (IV SOLUTION) ×2 IMPLANT
PACK LAMINECTOMY NEURO (CUSTOM PROCEDURE TRAY) ×2 IMPLANT
PAD ARMBOARD 7.5X6 YLW CONV (MISCELLANEOUS) ×6 IMPLANT
PATTIES SURGICAL .5 X1 (DISPOSABLE) IMPLANT
RUBBERBAND STERILE (MISCELLANEOUS) ×4 IMPLANT
SPONGE GAUZE 4X4 12PLY (GAUZE/BANDAGES/DRESSINGS) ×2 IMPLANT
SPONGE SURGIFOAM ABS GEL SZ50 (HEMOSTASIS) ×2 IMPLANT
STRIP CLOSURE SKIN 1/2X4 (GAUZE/BANDAGES/DRESSINGS) ×2 IMPLANT
SUT VIC AB 1 CT1 18XBRD ANBCTR (SUTURE) ×1 IMPLANT
SUT VIC AB 1 CT1 8-18 (SUTURE) ×1
SUT VIC AB 2-0 CP2 18 (SUTURE) ×2 IMPLANT
SYR 20CC LL (SYRINGE) IMPLANT
SYR 20ML ECCENTRIC (SYRINGE) ×2 IMPLANT
TAPE CLOTH SURG 4X10 WHT LF (GAUZE/BANDAGES/DRESSINGS) ×2 IMPLANT
TOWEL OR 17X24 6PK STRL BLUE (TOWEL DISPOSABLE) ×2 IMPLANT
TOWEL OR 17X26 10 PK STRL BLUE (TOWEL DISPOSABLE) ×2 IMPLANT
WATER STERILE IRR 1000ML POUR (IV SOLUTION) ×2 IMPLANT

## 2011-12-31 NOTE — Addendum Note (Signed)
Addendum  created 12/31/11 1610 by Colbi Schiltz A Marnee Sherrard, MD   Modules edited:Anesthesia Attestations    

## 2011-12-31 NOTE — Addendum Note (Signed)
Addendum  created 12/31/11 1610 by Kerby Nora, MD   Modules edited:Anesthesia Attestations

## 2011-12-31 NOTE — Anesthesia Postprocedure Evaluation (Signed)
  Anesthesia Post-op Note  Patient: Jennifer [name]  Procedure(s) Performed: Procedure(s) (LRB): LUMBAR LAMINECTOMY/DECOMPRESSION MICRODISCECTOMY 1 LEVEL (Left)  Patient Location: PACU  Anesthesia Type: General  Level of Consciousness: awake, alert  and oriented  Airway and Oxygen Therapy: Patient Spontanous Breathing and Patient connected to nasal cannula oxygen  Post-op Pain: mild  Post-op Assessment: Post-op Vital signs reviewed  Post-op Vital Signs: Reviewed  Complications: No apparent anesthesia complications

## 2011-12-31 NOTE — Transfer of Care (Signed)
Immediate Anesthesia Transfer of Care Note  Patient: Jennifer [name]  Procedure(s) Performed: Procedure(s) (LRB): LUMBAR LAMINECTOMY/DECOMPRESSION MICRODISCECTOMY 1 LEVEL (Left)  Patient Location: PACU  Anesthesia Type: General  Level of Consciousness: awake and alert   Airway & Oxygen Therapy: Patient Spontanous Breathing and Patient connected to face mask oxygen  Post-op Assessment: Report given to PACU RN  Post vital signs: Reviewed and stable  Complications: No apparent anesthesia complications

## 2011-12-31 NOTE — Progress Notes (Signed)
Subjective:  The patient is alert and pleasant. She is in no apparent stress. She looks well.  Objective: Vital signs in last 24 hours: Temp:  [97.8 F (36.6 C)-98.5 F (36.9 C)] 97.8 F (36.6 C) (06/17 1522) Pulse Rate:  [82] 82  (06/17 1048) Resp:  [18] 18  (06/17 1048) BP: (150-157)/(67-78) 157/67 mmHg (06/17 1522) SpO2:  [96 %-99 %] 99 % (06/17 1522)  Intake/Output from previous day:   Intake/Output this shift: Total I/O In: 1200 [I.V.:1200] Out: 25 [Blood:25]  Physical exam the patient is alert and pleasant. She is moving all 4 extremities well.  Lab Results: No results found for this basename: WBC:2,HGB:2,HCT:2,PLT:2 in the last 72 hours BMET No results found for this basename: NA:2,K:2,CL:2,CO2:2,GLUCOSE:2,BUN:2,CREATININE:2,CALCIUM:2 in the last 72 hours  Studies/Results: No results found.  Assessment/Plan: The patient is doing well.  LOS: 0 days     Osmara Drummonds D 12/31/2011, 3:32 PM

## 2011-12-31 NOTE — H&P (Signed)
Subjective: The patient is an 78 year old white female who I performed an L3-4 laminectomy on back in February of 2009. The patient has done well but recently has developed left leg pain. She has failed medical management. She was worked up with a lumbar MRI which demonstrated she had a left-sided herniated disc at L4-5. I discussed the various treatment options with the patient including surgery. She has weighed the risks, benefits, and alternatives surgery decided proceed with a redo left L4-5 discectomy.   Past Medical History  Diagnosis Date  . ALLERGIC RHINITIS 02/04/2007  . Altered mental status 11/05/2008  . ANEMIA-IRON DEFICIENCY 08/11/2008  . ANEMIA 06/30/2008  . ANXIETY 08/11/2008  . FREQUENCY, URINARY 04/28/2008  . GERD 02/04/2007  . HYPERTENSION 02/04/2007  . Hypopotassemia 11/08/2008  . HYPOTHYROIDISM 02/04/2007  . LOW BACK PAIN 12/31/2007  . OSTEOARTHRITIS 12/31/2007  . OSTEOPOROSIS 02/04/2007  . PRURITUS 06/03/2009  . RESTLESS LEG SYNDROME 03/18/2009  . Vascular dementia with delusions 11/25/2008  . WEAKNESS 11/05/2008  . WEIGHT LOSS 11/05/2008    Past Surgical History  Procedure Date  . Hemorrhoid surgery   . Abdominal hysterectomy   . Tonsillectomy   . Knee surgery     right  . Laminectomy   . Back surgery   . Tonsillectomy   . Knee arthroscopy     Right  . Appendectomy     Allergies  Allergen Reactions  . Ciprofloxacin     REACTION: itching  . Diazepam     REACTION: unspecified  . Meperidine Hcl     REACTION: unspecified    History  Substance Use Topics  . Smoking status: Former Smoker    Quit date: 07/16/1988  . Smokeless tobacco: Never Used  . Alcohol Use: No    History reviewed. No pertinent family history. Prior to Admission medications   Medication Sig Start Date End Date Taking? Authorizing Provider  amLODipine (NORVASC) 10 MG tablet Take 1 tablet (10 mg total) by mouth daily. 09/04/11  Yes Gordy Savers, MD  aspirin 81 MG tablet Take 81 mg by  mouth daily.     Yes Historical Provider, MD  Calcium Carbonate-Vitamin D (CALCIUM + D PO) Take 1 tablet by mouth 2 (two) times daily.   Yes Historical Provider, MD  donepezil (ARICEPT) 5 MG tablet Take 1 tablet (5 mg total) by mouth at bedtime. 09/04/11  Yes Gordy Savers, MD  donepezil (ARICEPT) 5 MG tablet TAKE 1 TABLET BY MOUTH EVERY DAY 12/27/11  Yes Gordy Savers, MD  FLUoxetine (PROZAC) 40 MG capsule Take 1 capsule (40 mg total) by mouth daily. 09/04/11  Yes Gordy Savers, MD  Folic Acid-Vit B6-Vit B12 (FOLTABS 800) 0.8-10-0.115 MG TABS Take 1 tablet by mouth daily.   Yes Historical Provider, MD  HYDROcodone-acetaminophen (VICODIN) 5-500 MG per tablet Take 1 tablet by mouth every 6 (six) hours as needed. 12/18/11  Yes Gordy Savers, MD  lansoprazole (PREVACID) 30 MG capsule Take 1 capsule (30 mg total) by mouth daily. 09/04/11  Yes Gordy Savers, MD  levothyroxine (SYNTHROID, LEVOTHROID) 50 MCG tablet Take 1 tablet (50 mcg total) by mouth daily. 09/04/11  Yes Gordy Savers, MD  losartan (COZAAR) 100 MG tablet Take 1 tablet (100 mg total) by mouth daily. 09/04/11  Yes Gordy Savers, MD  memantine (NAMENDA) 10 MG tablet Take 1 tablet (10 mg total) by mouth 2 (two) times daily. 09/04/11  Yes Gordy Savers, MD  oxybutynin (DITROPAN-XL) 10 MG 24 hr  tablet Take 1 tablet (10 mg total) by mouth daily. 09/04/11  Yes Gordy Savers, MD  fexofenadine (ALLEGRA) 180 MG tablet Take 1 tablet (180 mg total) by mouth daily. 09/04/11   Gordy Savers, MD  NAMENDA 10 MG tablet TAKE 1 TABLET BY MOUTH TWICE A DAY 12/27/11   Gordy Savers, MD     Review of Systems  Positive ROS: As above  All other systems have been reviewed and were otherwise negative with the exception of those mentioned in the HPI and as above.  Objective: Vital signs in last 24 hours: Temp:  [98.5 F (36.9 C)] 98.5 F (36.9 C) (06/17 1048) Pulse Rate:  [82] 82  (06/17 1048) Resp:   [18] 18  (06/17 1048) BP: (150)/(78) 150/78 mmHg (06/17 1048) SpO2:  [96 %] 96 % (06/17 1048)  General Appearance: Alert, cooperative, no distress, appears stated age Head: Normocephalic, without obvious abnormality, atraumatic Eyes: PERRL, conjunctiva/corneas clear, EOM's intact, fundi benign, both eyes      Ears: Normal TM's and external ear canals, both ears Throat: Lips, mucosa, and tongue normal; teeth and gums normal Neck: Supple, symmetrical, trachea midline, no adenopathy; thyroid: No enlargement/tenderness/nodules; no carotid bruit or JVD Back: Symmetric, no curvature, ROM normal, no CVA tenderness Lungs: Clear to auscultation bilaterally, respirations unlabored Heart: Regular rate and rhythm, S1 and S2 normal, no murmur, rub or gallop Abdomen: Soft, non-tender, bowel sounds active all four quadrants, no masses, no organomegaly Extremities: Extremities normal, atraumatic, no cyanosis or edema Pulses: 2+ and symmetric all extremities Skin: Skin color, texture, turgor normal, no rashes or lesions  NEUROLOGIC:   Mental status: alert and oriented, no aphasia, good attention span, Fund of knowledge/ memory ok Motor Exam - grossly normal except she has decreased strength in her left extensor hallucis longus at 4+ over 5 Sensory Exam - grossly normal Reflexes: Symmetric Coordination - grossly normal Gait - grossly normal Balance - grossly normal Cranial Nerves: I: smell Not tested  II: visual acuity  OS: Normal    OD: Normal   II: visual fields Full to confrontation  II: pupils Equal, round, reactive to light  III,VII: ptosis None  III,IV,VI: extraocular muscles  Full ROM  V: mastication Normal  V: facial light touch sensation  Normal  V,VII: corneal reflex  Present  VII: facial muscle function - upper  Normal  VII: facial muscle function - lower Normal  VIII: hearing Not tested  IX: soft palate elevation  Normal  IX,X: gag reflex Present  XI: trapezius strength  5/5  XI:  sternocleidomastoid strength 5/5  XI: neck flexion strength  5/5  XII: tongue strength  Normal    Data Review Lab Results  Component Value Date   WBC 11.5* 12/26/2011   HGB 13.4 12/26/2011   HCT 40.5 12/26/2011   MCV 91.4 12/26/2011   PLT 313 12/26/2011   Lab Results  Component Value Date   NA 143 12/26/2011   K 3.3* 12/26/2011   CL 103 12/26/2011   CO2 28 12/26/2011   BUN 20 12/26/2011   CREATININE 1.37* 12/26/2011   GLUCOSE 115* 12/26/2011   Lab Results  Component Value Date   INR 0.96 09/16/2010    Assessment/Plan: Left L4-5 herniated disc, spinal stenosis, lumbago, lumbar radiculopathy: I discussed the situation with the patient. I reviewed her MR scan with her and pointed out the abnormalities. We have discussed the various treatment options including surgery. I described the surgical option of a left L4-5 redo discectomy.  I've shown her surgical models. We have discussed the risks, benefits, alternatives, and likelihood of achieving our goals with surgery. I have answered all her questions. She wants to proceed with the operation.   Job Holtsclaw D 12/31/2011 1:24 PM

## 2011-12-31 NOTE — Op Note (Signed)
Brief history: The patient is an 78 year old white female who I performed lumbar surgery on several years ago. She has done well for years but has developed recurrent back buttocks and left leg pain consistent with a lumbar radiculopathy. She has failed medical management and was worked up with a lumbar MRI. This demonstrated a recurrent ruptured disc at L4-5 on the left. I discussed the various treatment options with the patient including surgery. The patient has weighed the risks, benefits, and alternatives surgery decided proceed with a redo left L4-5 discectomy.  Preoperative diagnosis: Recurrent left L4-5 herniated disc, spinal stenosis, lumbar radiculopathy, lumbago  Postoperative diagnosis: The same  Procedure: Redo left L4-5 Intervertebral discectomy using micro-dissection  Surgeon: Dr. Delma Officer  Asst.: Dr. Jillyn Hidden cram  Anesthesia: Gen. endotracheal  Estimated blood loss: Minimal  Drains: None  Complications: None  Description of procedure: The patient was brought to the operating room by the anesthesia team. General endotracheal anesthesia was induced. The patient was turned to the prone position on the Wilson frame. The patient's lumbosacral region was then prepared with Betadine scrub and Betadine solution. Sterile drapes were applied.  I then injected the area to be incised with Marcaine with epinephrine solution. I then used a scalpel to make a linear midline incision over the L4-5 intervertebral disc space incising through the patient's prior surgical scar.. I then used electrocautery to perform a left sided subperiosteal dissection exposing the spinous process and lamina of L4 and L5. We obtained intraoperative radiograph to confirm our location. I then inserted the [facility] retractor for exposure.  We then brought the operative microscope into the field. Under its magnification and illumination we completed the microdissection. I used a high-speed drill to widen the  laminotomy at L4. We encountered epidural fibrosis/scar tissue from the prior operation. We carefully dissected through this to expose the underlying dura. I then used a Kerrison punches to widen the laminotomy and perform a foraminotomy about the left L5 nerve root.. We then used microdissection to free up the thecal sac and the left L5 nerve root from the epidural tissue. I then used a Kerrison punch to perform a foraminotomy at about the left L5 nerve root. Dr. Wynetta Emery then used the nerve root retractor to gently retract the thecal sac and the left L5 nerve root medially. This exposed the intervertebral disc. We identified the ruptured disc and remove it with the pituitary forceps. We performed a partial empty discectomy using the pituitary forceps and the Karlin curettes.  I then palpated along the ventral surface of the thecal sac and along exit route of the left L5 nerve root and noted that the neural structures were well decompressed. This completed the decompression.  We then obtained hemostasis using bipolar electrocautery. We irrigated the wound out with bacitracin solution. We then removed the retractor. We then reapproximated the patient's thoracolumbar fascia with interrupted #1 Vicryl suture. We then reapproximated the patient's subcutaneous tissue with interrupted 3-0 Vicryl suture. We then reapproximated patient's skin with Steri-Strips and benzoin. The was then coated with bacitracin ointment. The drapes were removed. The patient was subsequently returned to the supine position where they were extubated by the anesthesia team. The patient was then transported to the postanesthesia care unit in stable condition. All sponge instrument and needle counts were correct at the end of this case.

## 2011-12-31 NOTE — Anesthesia Preprocedure Evaluation (Signed)
Anesthesia Evaluation  Patient identified by MRN, date of birth, ID band Patient awake    Reviewed: Allergy & Precautions, H&P , NPO status , Patient's Chart, lab work & pertinent test results  Airway Mallampati: I TM Distance: >3 FB Neck ROM: Full    Dental  (+) Poor Dentition, Partial Upper, Partial Lower and Dental Advisory Given   Pulmonary  breath sounds clear to auscultation        Cardiovascular hypertension, Pt. on medications Rhythm:Regular Rate:Normal     Neuro/Psych    GI/Hepatic GERD-  Medicated and Controlled,  Endo/Other  Hypothyroidism   Renal/GU      Musculoskeletal   Abdominal   Peds  Hematology   Anesthesia Other Findings   Reproductive/Obstetrics                           Anesthesia Physical Anesthesia Plan  ASA: III  Anesthesia Plan: General   Post-op Pain Management:    Induction: Intravenous  Airway Management Planned: Oral ETT  Additional Equipment:   Intra-op Plan:   Post-operative Plan: Extubation in OR  Informed Consent: I have reviewed the patients History and Physical, chart, labs and discussed the procedure including the risks, benefits and alternatives for the proposed anesthesia with the patient or authorized representative who has indicated his/her understanding and acceptance.   Dental advisory given  Plan Discussed with: Anesthesiologist, Surgeon and CRNA  Anesthesia Plan Comments:         Anesthesia Quick Evaluation

## 2011-12-31 NOTE — Preoperative (Signed)
Beta Blockers   Reason not to administer Beta Blockers:Not Applicable 

## 2012-01-01 ENCOUNTER — Encounter (HOSPITAL_COMMUNITY): Payer: Self-pay | Admitting: Neurosurgery

## 2012-01-01 MED ORDER — OXYCODONE-ACETAMINOPHEN 5-325 MG PO TABS: 1.0000 | ORAL_TABLET | ORAL | Status: AC | PRN

## 2012-01-01 MED ORDER — DSS 100 MG PO CAPS: 100.0000 mg | ORAL_CAPSULE | Freq: Two times a day (BID) | ORAL | Status: AC

## 2012-01-01 MED ORDER — CYCLOBENZAPRINE HCL 5 MG PO TABS: 5.0000 mg | ORAL_TABLET | Freq: Three times a day (TID) | ORAL | Status: AC | PRN

## 2012-01-01 NOTE — Discharge Summary (Signed)
Physician Discharge Summary  Patient ID: Jennifer [name] MRN: [id number] DOB/AGE: 12-12-1928 78 y.o.  Admit date: 12/31/2011 Discharge date: 01/01/2012  Admission Diagnoses: Recurrent L4-5 herniated disc, lumbago, lumbar radiculopathy, spinal stenosis  Discharge Diagnoses: The same Principal Problem:  *Ruptured lumbar disc   Discharged Condition: good  Hospital Course: I admitted the patient to [facility] on 12/31/2011. On the day of admission I performed a redo left L4-5 discectomy. The surgery went well. The patient's postop course was unremarkable. On postop day #1 the patient was eating well and doing well urinating well and requesting discharge to home. The patient was given oral and discharge instructions. All her questions were answered.  Consults: None Significant Diagnostic Studies: None Treatments: Redo left L4-5 discectomy using microdissection Discharge Exam: Blood pressure 146/84, pulse 83, temperature 98.9 F (37.2 C), temperature source Oral, resp. rate 18, SpO2 98.00%. The patient is alert and oriented. Her motor strength is grossly normal in all 4 extremities. Her dressing is clean and dry.  Disposition: Home  Discharge Orders    Future Orders Please Complete By Expires   Diet - low sodium heart healthy      Increase activity slowly      Discharge instructions      Comments:   Call [phone] for a followup appointment.   Remove dressing in 48 hours      Call MD for:  temperature >100.4      Call MD for:  persistant nausea and vomiting      Call MD for:  severe uncontrolled pain      Call MD for:  redness, tenderness, or signs of infection (pain, swelling, redness, odor or green/yellow discharge around incision site)      Call MD for:  difficulty breathing, headache or visual disturbances      Call MD for:  hives      Call MD for:  persistant dizziness or light-headedness      Call MD for:  extreme fatigue        Medication List  As of  01/01/2012  7:43 AM   STOP taking these medications         HYDROcodone-acetaminophen 5-500 MG per tablet         TAKE these medications         amLODipine 10 MG tablet   Commonly known as: NORVASC   Take 1 tablet (10 mg total) by mouth daily.      aspirin 81 MG tablet   Take 81 mg by mouth daily.      CALCIUM + D PO   Take 1 tablet by mouth 2 (two) times daily.      cyclobenzaprine 5 MG tablet   Commonly known as: FLEXERIL   Take 1 tablet (5 mg total) by mouth 3 (three) times daily as needed for muscle spasms.      donepezil 5 MG tablet   Commonly known as: ARICEPT   Take 1 tablet (5 mg total) by mouth at bedtime.      donepezil 5 MG tablet   Commonly known as: ARICEPT   TAKE 1 TABLET BY MOUTH EVERY DAY      DSS 100 MG Caps   Take 100 mg by mouth 2 (two) times daily.      fexofenadine 180 MG tablet   Commonly known as: ALLEGRA   Take 1 tablet (180 mg total) by mouth daily.      FLUoxetine 40 MG capsule   Commonly known as:  PROZAC   Take 1 capsule (40 mg total) by mouth daily.      FOLTABS 800 0.8-10-0.115 MG Tabs   Generic drug: Folic Acid-Vit B6-Vit B12   Take 1 tablet by mouth daily.      lansoprazole 30 MG capsule   Commonly known as: PREVACID   Take 1 capsule (30 mg total) by mouth daily.      levothyroxine 50 MCG tablet   Commonly known as: SYNTHROID, LEVOTHROID   Take 1 tablet (50 mcg total) by mouth daily.      losartan 100 MG tablet   Commonly known as: COZAAR   Take 1 tablet (100 mg total) by mouth daily.      memantine 10 MG tablet   Commonly known as: NAMENDA   Take 1 tablet (10 mg total) by mouth 2 (two) times daily.      NAMENDA 10 MG tablet   Generic drug: memantine   TAKE 1 TABLET BY MOUTH TWICE A DAY      oxybutynin 10 MG 24 hr tablet   Commonly known as: DITROPAN-XL   Take 1 tablet (10 mg total) by mouth daily.      oxyCODONE-acetaminophen 5-325 MG per tablet   Commonly known as: PERCOCET   Take 1 tablet by mouth every 4 (four)  hours as needed.             SignedCristi Loron 01/01/2012, 7:43 AM

## 2012-01-01 NOTE — Plan of Care (Signed)
Problem: Consults Goal: Diagnosis - Spinal Surgery Outcome: Completed/Met Date Met:  01/01/12 Microdiscectomy

## 2012-01-01 NOTE — Progress Notes (Signed)
UR COMPLETED  

## 2012-01-31 ENCOUNTER — Ambulatory Visit (INDEPENDENT_AMBULATORY_CARE_PROVIDER_SITE_OTHER): Payer: Medicare Other | Admitting: Internal Medicine

## 2012-01-31 ENCOUNTER — Telehealth: Payer: Self-pay | Admitting: Internal Medicine

## 2012-01-31 ENCOUNTER — Encounter: Payer: Self-pay | Admitting: Internal Medicine

## 2012-01-31 VITALS — BP 142/76 | Temp 98.1°F | Wt 143.0 lb

## 2012-01-31 DIAGNOSIS — I10 Essential (primary) hypertension: Secondary | ICD-10-CM

## 2012-01-31 DIAGNOSIS — M1990 Unspecified osteoarthritis, unspecified site: Secondary | ICD-10-CM

## 2012-01-31 DIAGNOSIS — F411 Generalized anxiety disorder: Secondary | ICD-10-CM

## 2012-01-31 MED ORDER — TRAMADOL HCL 50 MG PO TABS: ORAL_TABLET | ORAL | Status: DC

## 2012-01-31 MED ORDER — BUPROPION HCL ER (XL) 150 MG PO TB24: 150.0000 mg | ORAL_TABLET | Freq: Every day | ORAL | Status: DC

## 2012-01-31 NOTE — Telephone Encounter (Signed)
Caller: Cindy/Child; PCP: Eleonore Chiquito; CB#: [phone]; ; ; Call regarding Back Pain;  Daughter calling because parent is having back pain and this is a chronic problem. She is not with parent for triage. States they need something done to help with pain and surgery did not help. Appt scheduled for today at 11:15am with Dr. Amador Cunas.

## 2012-01-31 NOTE — Progress Notes (Signed)
Subjective:    Patient ID: Jennifer [name], female    DOB: 11-17-1928, 78 y.o.   MRN: [id number]  HPI   78 year old patient who is seen today for followup. She has a history of chronic low back pain and underwent a second laminectomy approximately 3 months ago. She continues to complain bitterly of low back and left leg pain. She was not helped by surgery. She has been placed on hydrocodone oxycodone and fentanyl patch in the past without much benefit. She has a history of chronic anxiety and dementia. Her daughter feels that most of her complaints are related to secondary gain issues.  Past Medical History  Diagnosis Date  . ALLERGIC RHINITIS 02/04/2007  . Altered mental status 11/05/2008  . ANEMIA-IRON DEFICIENCY 08/11/2008  . ANEMIA 06/30/2008  . ANXIETY 08/11/2008  . FREQUENCY, URINARY 04/28/2008  . GERD 02/04/2007  . HYPERTENSION 02/04/2007  . Hypopotassemia 11/08/2008  . HYPOTHYROIDISM 02/04/2007  . LOW BACK PAIN 12/31/2007  . OSTEOARTHRITIS 12/31/2007  . OSTEOPOROSIS 02/04/2007  . PRURITUS 06/03/2009  . RESTLESS LEG SYNDROME 03/18/2009  . Vascular dementia with delusions 11/25/2008  . WEAKNESS 11/05/2008  . WEIGHT LOSS 11/05/2008    History   Social History  . Marital Status: Widowed    Spouse Name: N/A    Number of Children: N/A  . Years of Education: N/A   Occupational History  . Not on file.   Social History Main Topics  . Smoking status: Former Smoker    Quit date: 07/16/1988  . Smokeless tobacco: Never Used  . Alcohol Use: No  . Drug Use: No  . Sexually Active: Not on file   Other Topics Concern  . Not on file   Social History Narrative  . No narrative on file    Past Surgical History  Procedure Date  . Hemorrhoid surgery   . Abdominal hysterectomy   . Tonsillectomy   . Knee surgery     right  . Laminectomy   . Back surgery   . Tonsillectomy   . Knee arthroscopy     Right  . Appendectomy   . Lumbar laminectomy/decompression microdiscectomy 12/31/2011   Procedure: LUMBAR LAMINECTOMY/DECOMPRESSION MICRODISCECTOMY 1 LEVEL;  Surgeon: Cristi Loron, MD;  Location: [facility];  Service: Neurosurgery;  Laterality: Left;  Left Lumbar Four-Five Discectomy redo     No family history on file.  Allergies  Allergen Reactions  . Ciprofloxacin     REACTION: itching  . Diazepam     REACTION: unspecified  . Meperidine Hcl     REACTION: unspecified    Current Outpatient Prescriptions on File Prior to Visit  Medication Sig Dispense Refill  . amLODipine (NORVASC) 10 MG tablet Take 1 tablet (10 mg total) by mouth daily.  90 tablet  6  . aspirin 81 MG tablet Take 81 mg by mouth daily.        . Calcium Carbonate-Vitamin D (CALCIUM + D PO) Take 1 tablet by mouth 2 (two) times daily.      [name] donepezil (ARICEPT) 5 MG tablet Take 1 tablet (5 mg total) by mouth at bedtime.  90 tablet  2  . donepezil (ARICEPT) 5 MG tablet TAKE 1 TABLET BY MOUTH EVERY DAY  90 tablet  2  . fexofenadine (ALLEGRA) 180 MG tablet Take 1 tablet (180 mg total) by mouth daily.  90 tablet  6  . FLUoxetine (PROZAC) 40 MG capsule Take 1 capsule (40 mg total) by mouth daily.  90 capsule  3  .  Folic Acid-Vit B6-Vit B12 (FOLTABS 800) 0.8-10-0.115 MG TABS Take 1 tablet by mouth daily.      . lansoprazole (PREVACID) 30 MG capsule Take 1 capsule (30 mg total) by mouth daily.  90 capsule  6  . levothyroxine (SYNTHROID, LEVOTHROID) 50 MCG tablet Take 1 tablet (50 mcg total) by mouth daily.  90 tablet  6  . losartan (COZAAR) 100 MG tablet Take 1 tablet (100 mg total) by mouth daily.  90 tablet  4  . memantine (NAMENDA) 10 MG tablet Take 1 tablet (10 mg total) by mouth 2 (two) times daily.  180 tablet  1  . NAMENDA 10 MG tablet TAKE 1 TABLET BY MOUTH TWICE A DAY  180 tablet  1  . oxybutynin (DITROPAN-XL) 10 MG 24 hr tablet Take 1 tablet (10 mg total) by mouth daily.  90 tablet  6  . buPROPion (WELLBUTRIN XL) 150 MG 24 hr tablet Take 1 tablet (150 mg total) by mouth daily.  60 tablet  3    BP  142/76  Temp 98.1 F (36.7 C) (Oral)  Wt 143 lb (64.864 kg)       Review of Systems  Constitutional: Negative.   HENT: Negative for hearing loss, congestion, sore throat, rhinorrhea, dental problem, sinus pressure and tinnitus.   Eyes: Negative for pain, discharge and visual disturbance.  Respiratory: Negative for cough and shortness of breath.   Cardiovascular: Negative for chest pain, palpitations and leg swelling.  Gastrointestinal: Negative for nausea, vomiting, abdominal pain, diarrhea, constipation, blood in stool and abdominal distention.  Genitourinary: Negative for dysuria, urgency, frequency, hematuria, flank pain, vaginal bleeding, vaginal discharge, difficulty urinating, vaginal pain and pelvic pain.  Musculoskeletal: Positive for back pain. Negative for joint swelling, arthralgias and gait problem.  Skin: Negative for rash.  Neurological: Negative for dizziness, syncope, speech difficulty, weakness, numbness and headaches.  Hematological: Negative for adenopathy.  Psychiatric/Behavioral: Negative for behavioral problems, dysphoric mood and agitation. The patient is not nervous/anxious.        Objective:   Physical Exam  Constitutional: She is oriented to person, place, and time. She appears well-developed and well-nourished.  HENT:  Head: Normocephalic.  Right Ear: External ear normal.  Left Ear: External ear normal.  Mouth/Throat: Oropharynx is clear and moist.  Eyes: Conjunctivae and EOM are normal. Pupils are equal, round, and reactive to light.  Neck: Normal range of motion. Neck supple. No thyromegaly present.  Cardiovascular: Normal rate, regular rhythm, normal heart sounds and intact distal pulses.   Pulmonary/Chest: Effort normal and breath sounds normal.  Abdominal: Soft. Bowel sounds are normal. She exhibits no mass. There is no tenderness.  Musculoskeletal: Normal range of motion.       Left patellar reflex diminished Weakness and left-sided foot  dorsiflexion  Lymphadenopathy:    She has no cervical adenopathy.  Neurological: She is alert and oriented to person, place, and time.  Skin: Skin is warm and dry. No rash noted.  Psychiatric: She has a normal mood and affect. Her behavior is normal.          Assessment & Plan:    Chronic low back pain  Anxiety disorder  Dementia   Will place on Advil twice a day and Ultram twice a day  We'll try Welbutrin  150 daily   Recheck 3 months

## 2012-01-31 NOTE — Patient Instructions (Signed)
Keep active but avoid any activities that cause pain.  Apply moist heat to the low back area  daily as needed  Limit your sodium (Salt) intake

## 2012-02-16 ENCOUNTER — Emergency Department (INDEPENDENT_AMBULATORY_CARE_PROVIDER_SITE_OTHER)
Admission: EM | Admit: 2012-02-16 | Discharge: 2012-02-16 | Disposition: A | Discharge status: Home / Self Care | Payer: Medicare Other | Source: Home / Self Care | Attending: Emergency Medicine | Admitting: Emergency Medicine

## 2012-02-16 ENCOUNTER — Encounter (HOSPITAL_COMMUNITY): Payer: Self-pay | Admitting: *Deleted

## 2012-02-16 DIAGNOSIS — N390 Urinary tract infection, site not specified: Secondary | ICD-10-CM

## 2012-02-16 LAB — POCT URINALYSIS DIP (DEVICE)
Bilirubin Urine: NEGATIVE
Glucose, UA: NEGATIVE mg/dL
Specific Gravity, Urine: 1.01 (ref 1.005–1.030)
Urobilinogen, UA: 0.2 mg/dL (ref 0.0–1.0)
pH: 7 (ref 5.0–8.0)

## 2012-02-16 MED ORDER — NITROFURANTOIN MONOHYD MACRO 100 MG PO CAPS: 100.0000 mg | ORAL_CAPSULE | Freq: Two times a day (BID) | ORAL | Status: AC

## 2012-02-16 NOTE — ED Notes (Signed)
Pt is here with complaints of 2 day history of urinary frequency, urgency and burning.  Denies additional symptoms except hip and leg pain caused by arthritis.

## 2012-02-16 NOTE — ED Provider Notes (Signed)
History     CSN: [id number]  Arrival date & time 02/16/12  1429   First MD Initiated Contact with Patient 02/16/12 1448      Chief Complaint  Patient presents with  . Urinary Tract Infection    (Consider location/radiation/quality/duration/timing/severity/associated sxs/prior treatment) HPI Comments: Patient presents this afternoon to urgent care complaining of a 2 day history of urinary frequency, urgency and a bit of burning. "It doesn't hurt but it burned some". Denies any abdominal pain, nausea, vomiting, fevers chills or flank pain. Patient also talks about her ongoing hip arthritis and discomfort that she has patient in the left side in once to know if she can take something stronger than Motrin.  Patient is a 78 y.o. female presenting with urinary tract infection. The history is provided by the patient and a relative.  Urinary Tract Infection This is a new problem. The problem has not changed since onset.Exacerbated by: Urination. Nothing relieves the symptoms. She has tried nothing for the symptoms. The treatment provided no relief.    Past Medical History  Diagnosis Date  . ALLERGIC RHINITIS 02/04/2007  . Altered mental status 11/05/2008  . ANEMIA-IRON DEFICIENCY 08/11/2008  . ANEMIA 06/30/2008  . ANXIETY 08/11/2008  . FREQUENCY, URINARY 04/28/2008  . GERD 02/04/2007  . HYPERTENSION 02/04/2007  . Hypopotassemia 11/08/2008  . HYPOTHYROIDISM 02/04/2007  . LOW BACK PAIN 12/31/2007  . OSTEOARTHRITIS 12/31/2007  . OSTEOPOROSIS 02/04/2007  . PRURITUS 06/03/2009  . RESTLESS LEG SYNDROME 03/18/2009  . Vascular dementia with delusions 11/25/2008  . WEAKNESS 11/05/2008  . WEIGHT LOSS 11/05/2008    Past Surgical History  Procedure Date  . Hemorrhoid surgery   . Abdominal hysterectomy   . Tonsillectomy   . Knee surgery     right  . Laminectomy   . Back surgery   . Tonsillectomy   . Knee arthroscopy     Right  . Appendectomy   . Lumbar laminectomy/decompression microdiscectomy  12/31/2011    Procedure: LUMBAR LAMINECTOMY/DECOMPRESSION MICRODISCECTOMY 1 LEVEL;  Surgeon: Cristi Loron, MD;  Location: [facility];  Service: Neurosurgery;  Laterality: Left;  Left Lumbar Four-Five Discectomy redo     History reviewed. No pertinent family history.  History  Substance Use Topics  . Smoking status: Former Smoker    Quit date: 07/16/1988  . Smokeless tobacco: Never Used  . Alcohol Use: No    OB History    Grav Para Term Preterm Abortions TAB SAB Ect Mult Living                  Review of Systems  Constitutional: Negative for fever and activity change.  Genitourinary: Positive for dysuria and frequency. Negative for flank pain, vaginal bleeding and vaginal pain.  Skin: Negative for rash and wound.    Allergies  Ciprofloxacin; Diazepam; and Meperidine hcl  Home Medications   Current Outpatient Rx  Name Route Sig Dispense Refill  . AMLODIPINE BESYLATE 10 MG PO TABS Oral Take 1 tablet (10 mg total) by mouth daily. 90 tablet 6  . ASPIRIN 81 MG PO TABS Oral Take 81 mg by mouth daily.      . BUPROPION HCL ER (XL) 150 MG PO TB24 Oral Take 1 tablet (150 mg total) by mouth daily. 60 tablet 3  . CALCIUM + D PO Oral Take 1 tablet by mouth 2 (two) times daily.    . DONEPEZIL HCL 5 MG PO TABS Oral Take 1 tablet (5 mg total) by mouth at bedtime. 90 tablet  2  . DONEPEZIL HCL 5 MG PO TABS  TAKE 1 TABLET BY MOUTH EVERY DAY 90 tablet 2  . FEXOFENADINE HCL 180 MG PO TABS Oral Take 1 tablet (180 mg total) by mouth daily. 90 tablet 6  . FLUOXETINE HCL 40 MG PO CAPS Oral Take 1 capsule (40 mg total) by mouth daily. 90 capsule 3  . FOLIC ACID-VIT B6-VIT B12 0.8-10-0.115 MG PO TABS Oral Take 1 tablet by mouth daily.    [name] LANSOPRAZOLE 30 MG PO CPDR Oral Take 1 capsule (30 mg total) by mouth daily. 90 capsule 6  . LEVOTHYROXINE SODIUM 50 MCG PO TABS Oral Take 1 tablet (50 mcg total) by mouth daily. 90 tablet 6  . LOSARTAN POTASSIUM 100 MG PO TABS Oral Take 1 tablet (100 mg  total) by mouth daily. 90 tablet 4  . MEMANTINE HCL 10 MG PO TABS Oral Take 1 tablet (10 mg total) by mouth 2 (two) times daily. 180 tablet 1  . NAMENDA 10 MG PO TABS  TAKE 1 TABLET BY MOUTH TWICE A DAY 180 tablet 1  . NITROFURANTOIN MONOHYD MACRO 100 MG PO CAPS Oral Take 1 capsule (100 mg total) by mouth 2 (two) times daily. 14 capsule 0  . OXYBUTYNIN CHLORIDE ER 10 MG PO TB24 Oral Take 1 tablet (10 mg total) by mouth daily. 90 tablet 6  . TRAMADOL HCL 50 MG PO TABS  One twice daily 60 tablet 3    BP 137/78  Pulse 82  Temp 98.1 F (36.7 C) (Oral)  Resp 22  SpO2 97%  Physical Exam  Nursing note and vitals reviewed. Constitutional: Vital signs are normal. She appears well-developed and well-nourished.  Non-toxic appearance. She does not have a sickly appearance. She does not appear ill. No distress.  Abdominal: Soft. There is no tenderness. There is no rebound and no CVA tenderness.    ED Course  Procedures (including critical care time)  Labs Reviewed  POCT URINALYSIS DIP (DEVICE) - Abnormal; Notable for the following:    Ketones, ur TRACE (*)     Hgb urine dipstick TRACE (*)     Leukocytes, UA SMALL (*)  Biochemical Testing Only. Please order routine urinalysis from main lab if confirmatory testing is needed.   All other components within normal limits  URINE CULTURE   No results found.   1. Urinary tract infection       MDM   Uncomplicated UTI. We also discussed management options for her left hip arthritis. Recommend her to use Tylenol arthritis over-the-counter and to not take any more ibuprofen. Patient was prescribed Macrobid cycle for 7 days. Urine was sent for cultures       Jimmie Molly, MD 02/16/12 1601

## 2012-02-17 LAB — URINE CULTURE

## 2012-02-28 ENCOUNTER — Encounter (HOSPITAL_COMMUNITY): Payer: Self-pay | Admitting: Emergency Medicine

## 2012-02-28 ENCOUNTER — Telehealth: Payer: Self-pay | Admitting: Internal Medicine

## 2012-02-28 ENCOUNTER — Emergency Department (INDEPENDENT_AMBULATORY_CARE_PROVIDER_SITE_OTHER)
Admission: EM | Admit: 2012-02-28 | Discharge: 2012-02-28 | Disposition: A | Discharge status: Home / Self Care | Payer: Medicare Other | Source: Home / Self Care | Attending: Emergency Medicine | Admitting: Emergency Medicine

## 2012-02-28 DIAGNOSIS — N390 Urinary tract infection, site not specified: Secondary | ICD-10-CM

## 2012-02-28 LAB — URINALYSIS, ROUTINE W REFLEX MICROSCOPIC
Glucose, UA: NEGATIVE mg/dL
Hgb urine dipstick: NEGATIVE
Nitrite: NEGATIVE
Specific Gravity, Urine: 1.024 (ref 1.005–1.030)
pH: 6.5 (ref 5.0–8.0)

## 2012-02-28 LAB — POCT URINALYSIS DIP (DEVICE)
Glucose, UA: NEGATIVE mg/dL
Hgb urine dipstick: NEGATIVE
Ketones, ur: 80 mg/dL — AB
Leukocytes, UA: NEGATIVE
Protein, ur: 100 mg/dL — AB
Specific Gravity, Urine: 1.02 (ref 1.005–1.030)
Urobilinogen, UA: 1 mg/dL (ref 0.0–1.0)
pH: 6.5 (ref 5.0–8.0)

## 2012-02-28 LAB — URINE MICROSCOPIC-ADD ON

## 2012-02-28 MED ORDER — SULFAMETHOXAZOLE-TRIMETHOPRIM 800-160 MG PO TABS: 1.0000 | ORAL_TABLET | Freq: Two times a day (BID) | ORAL | Status: AC

## 2012-02-28 MED ORDER — PHENAZOPYRIDINE HCL 200 MG PO TABS: 200.0000 mg | ORAL_TABLET | Freq: Three times a day (TID) | ORAL | Status: AC | PRN

## 2012-02-28 NOTE — ED Notes (Signed)
Seen at [facility] on 8/3 for uti.  Treated with macrabid.  Reports this medicine has been completed.  Continues to complain of difficulty urinating, frequency at night.  Last urination was 7:00am today, but feels like she needs to go constantly

## 2012-02-28 NOTE — Telephone Encounter (Signed)
Pt daughter aware states she is going to take her to the ER or urgent care because she does not feel like she can wait till tomorrow

## 2012-02-28 NOTE — Telephone Encounter (Signed)
Pts daughter called and said that she was transferred to [facility] but no one was avail. Pt went to Urgent Care on 02/16/12 for Bladder Inf. Pt is still having problems urinating and it is hurting.

## 2012-02-28 NOTE — ED Provider Notes (Signed)
History     CSN: [id number]  Arrival date & time 02/28/12  1513   First MD Initiated Contact with Patient 02/28/12 1532      Chief Complaint  Patient presents with  . Urinary Tract Infection    (Consider location/radiation/quality/duration/timing/severity/associated sxs/prior treatment) HPI Comments: Patient was seen on 8/3 with urinary frequency, urgency, mild dysuria. She was thought to have UTI, and sent home on Macrobid. Culture showed 10,000 CFU's of multiple bacterial morphotypes. Patient states she finished the Macrobid, and her symptoms somewhat improved, but states that the urgency, frequency has returned. States that she is having difficulty urinating, and is only able to urinate small amounts at a  time, despite drinking large amounts of fluids. No urinary or fecal incontinence. No nausea, vomiting, fevers. No cloudy odorous urine, hematuria. No back pain. No vaginal bleeding, discharge, vulvar itching. Patient has a history of bladder spasms, with urinary frequency at night and is currently on oxybutynin. She was also recently started on Wellbutrin and tramadol near the end of July. No other new medications.  ROS as noted in HPI. All other ROS negative.   Patient is a 78 y.o. female presenting with urinary tract infection. The history is provided by the patient and a relative. No language interpreter was used.  Urinary Tract Infection This is a recurrent problem. The current episode started more than 2 days ago. The problem occurs daily. The problem has not changed since onset.Pertinent negatives include no abdominal pain. Nothing aggravates the symptoms. Nothing relieves the symptoms.    Past Medical History  Diagnosis Date  . ALLERGIC RHINITIS 02/04/2007  . Altered mental status 11/05/2008  . ANEMIA-IRON DEFICIENCY 08/11/2008  . ANEMIA 06/30/2008  . ANXIETY 08/11/2008  . FREQUENCY, URINARY 04/28/2008  . GERD 02/04/2007  . HYPERTENSION 02/04/2007  . Hypopotassemia 11/08/2008    . HYPOTHYROIDISM 02/04/2007  . LOW BACK PAIN 12/31/2007  . OSTEOARTHRITIS 12/31/2007  . OSTEOPOROSIS 02/04/2007  . PRURITUS 06/03/2009  . RESTLESS LEG SYNDROME 03/18/2009  . Vascular dementia with delusions 11/25/2008  . WEAKNESS 11/05/2008  . WEIGHT LOSS 11/05/2008    Past Surgical History  Procedure Date  . Hemorrhoid surgery   . Abdominal hysterectomy   . Tonsillectomy   . Knee surgery     right  . Laminectomy   . Back surgery   . Tonsillectomy   . Knee arthroscopy     Right  . Appendectomy   . Lumbar laminectomy/decompression microdiscectomy 12/31/2011    Procedure: LUMBAR LAMINECTOMY/DECOMPRESSION MICRODISCECTOMY 1 LEVEL;  Surgeon: Cristi Loron, MD;  Location: [facility];  Service: Neurosurgery;  Laterality: Left;  Left Lumbar Four-Five Discectomy redo     History reviewed. No pertinent family history.  History  Substance Use Topics  . Smoking status: Former Smoker    Quit date: 07/16/1988  . Smokeless tobacco: Never Used  . Alcohol Use: No    OB History    Grav Para Term Preterm Abortions TAB SAB Ect Mult Living                  Review of Systems  Gastrointestinal: Negative for abdominal pain.    Allergies  Ciprofloxacin; Diazepam; and Meperidine hcl  Home Medications   Current Outpatient Rx  Name Route Sig Dispense Refill  . AMLODIPINE BESYLATE 10 MG PO TABS Oral Take 1 tablet (10 mg total) by mouth daily. 90 tablet 6  . ASPIRIN 81 MG PO TABS Oral Take 81 mg by mouth daily.      [name]  BUPROPION HCL ER (XL) 150 MG PO TB24 Oral Take 1 tablet (150 mg total) by mouth daily. 60 tablet 3  . CALCIUM + D PO Oral Take 1 tablet by mouth 2 (two) times daily.    . DONEPEZIL HCL 5 MG PO TABS Oral Take 1 tablet (5 mg total) by mouth at bedtime. 90 tablet 2  . DONEPEZIL HCL 5 MG PO TABS  TAKE 1 TABLET BY MOUTH EVERY DAY 90 tablet 2  . FEXOFENADINE HCL 180 MG PO TABS Oral Take 1 tablet (180 mg total) by mouth daily. 90 tablet 6  . FLUOXETINE HCL 40 MG PO CAPS Oral Take  1 capsule (40 mg total) by mouth daily. 90 capsule 3  . FOLIC ACID-VIT B6-VIT B12 0.8-10-0.115 MG PO TABS Oral Take 1 tablet by mouth daily.    [name] LANSOPRAZOLE 30 MG PO CPDR Oral Take 1 capsule (30 mg total) by mouth daily. 90 capsule 6  . LEVOTHYROXINE SODIUM 50 MCG PO TABS Oral Take 1 tablet (50 mcg total) by mouth daily. 90 tablet 6  . LOSARTAN POTASSIUM 100 MG PO TABS Oral Take 1 tablet (100 mg total) by mouth daily. 90 tablet 4  . MEMANTINE HCL 10 MG PO TABS Oral Take 1 tablet (10 mg total) by mouth 2 (two) times daily. 180 tablet 1  . NAMENDA 10 MG PO TABS  TAKE 1 TABLET BY MOUTH TWICE A DAY 180 tablet 1  . OXYBUTYNIN CHLORIDE ER 10 MG PO TB24 Oral Take 1 tablet (10 mg total) by mouth daily. 90 tablet 6  . PHENAZOPYRIDINE HCL 200 MG PO TABS Oral Take 1 tablet (200 mg total) by mouth 3 (three) times daily as needed for pain. 6 tablet 0  . SULFAMETHOXAZOLE-TRIMETHOPRIM 800-160 MG PO TABS Oral Take 1 tablet by mouth 2 (two) times daily. X 7 days 14 tablet 0  . TRAMADOL HCL 50 MG PO TABS  One twice daily 60 tablet 3    BP 158/91  Pulse 56  Temp 98.5 F (36.9 C) (Oral)  Resp 18  SpO2 98%  Physical Exam  Nursing note and vitals reviewed. Constitutional: She is oriented to person, place, and time. She appears well-developed and well-nourished. No distress.  HENT:  Head: Normocephalic and atraumatic.  Eyes: Conjunctivae and EOM are normal.  Neck: Normal range of motion.  Cardiovascular: Normal rate.   Pulmonary/Chest: Effort normal.  Abdominal: Soft. Normal appearance and bowel sounds are normal. She exhibits no distension and no mass. There is tenderness in the suprapubic area. There is no rebound, no guarding and no CVA tenderness.  Musculoskeletal: Normal range of motion.       Lumbar back: Normal.  Neurological: She is alert and oriented to person, place, and time. Coordination normal.       Ambulates independently. LE sensation grossly intact.  Skin: Skin is warm and dry.    Psychiatric: She has a normal mood and affect. Her behavior is normal. Judgment and thought content normal.    ED Course  Procedures (including critical care time)  Labs Reviewed  POCT URINALYSIS DIP (DEVICE) - Abnormal; Notable for the following:    Bilirubin Urine MODERATE (*)     Ketones, ur 80 (*)     Protein, ur 100 (*)     Leukocytes, UA TRACE (*)  Biochemical Testing Only. Please order routine urinalysis from main lab if confirmatory testing is needed.   All other components within normal limits  POCT URINALYSIS DIP (DEVICE) - Abnormal; Notable  for the following:    Bilirubin Urine MODERATE (*)     Ketones, ur 80 (*)     Protein, ur 30 (*)     All other components within normal limits  URINE CULTURE   No results found.   1. UTI (lower urinary tract infection)     MDM  Previous records reviewed. As noted in history of present illness. No CVA tenderness, history of back pain, nausea, vomiting, fevers. Patient has no history of kidney stones. Doubt bilateral obstruction. No h/o trauma, or tenderness over L spine. No history of incontinence. Doubt spinal cord involvement. Abd soft on exam. Obtaining postvoid residual. Sending catheterized Specimen off for culture. If no evidence of acute urinary retention, will start her second round of antibiotics for presumed UTI, and refer her to Dr. Brunilda Payor, urologist on call. Will try some pyridium for sx relief. Pt already on oxybutynin.  PVR < 100 cc's per RN    Luiz Blare, MD 02/28/12 2127

## 2012-02-28 NOTE — Telephone Encounter (Signed)
Please call and make appt with any provider

## 2012-02-29 LAB — URINE CULTURE

## 2012-03-04 ENCOUNTER — Telehealth: Payer: Self-pay | Admitting: Internal Medicine

## 2012-03-04 NOTE — Telephone Encounter (Signed)
Please advise 

## 2012-03-04 NOTE — Telephone Encounter (Signed)
Spoke with daughter - informed of dr. Vernon Prey instructions - the family will need to check out different facilities /see what is avaible / insurance coverage in the meantime I will see who we can get in there to eval for safe in home.

## 2012-03-04 NOTE — Telephone Encounter (Signed)
Please have adult protective agency evaluate home situation; May need hospitalization for expediting nursing home placement

## 2012-03-04 NOTE — Telephone Encounter (Signed)
Caller: Cindy/Child; Patient Name: Jennifer, [name] AVWUJ([phone]); PCP: Eleonore Chiquito; Best Callback Phone Number: [phone]; Reason for call: Caller reports that pt has had a UTI (and is being treated for it).  Pt was seen on 02/16/12 and 02/28/12 at the Urgent Care.   Caller reports she is "not trying to do anything for herself anymore".  She is falling on purpose and then urinating on herself. Caller is not requesting an appt but requesting some advice on nursing home placement and/or rehab placement.  Caller states she feels exhausted by it all. Please call [name] [phone] or Vonna Kotyk at [phone].  Thank you.

## 2012-03-10 ENCOUNTER — Telehealth: Payer: Self-pay | Admitting: Internal Medicine

## 2012-03-10 DIAGNOSIS — R4189 Other symptoms and signs involving cognitive functions and awareness: Secondary | ICD-10-CM

## 2012-03-10 NOTE — Telephone Encounter (Signed)
Spoke with cindy (daughter ) having problem getting mother to get up and go to bathroom when no one is there to prompt her - she will go in the depends, she does very little to nothing for herself when no one is there. I tried to explain that there isnt much we can do about that since she does use the bathroom when prompted by someone in the home. Daughter has a few other questions for dr. Amador Cunas and would like him to call her. She understands he is with patients at this time. Best number to call [phone]  I have put in a home health eval for services avaible and to eval for home safety

## 2012-03-10 NOTE — Telephone Encounter (Signed)
Pease advise.

## 2012-03-10 NOTE — Telephone Encounter (Signed)
Pts daughter called and said that she tried contacting [facility] but could not get answer. Pt daughter is req call back from [facility]. Having difficulty with pt going to bathroom. Pt will sit in chair or bed all day long and not make any attempt to go to bathroom.

## 2012-03-11 NOTE — Telephone Encounter (Signed)
DR. Frederica Kuster spoke with daughter - cindy

## 2012-03-19 ENCOUNTER — Emergency Department (HOSPITAL_COMMUNITY): Payer: Medicare Other

## 2012-03-19 ENCOUNTER — Emergency Department (HOSPITAL_COMMUNITY)
Admission: EM | Admit: 2012-03-19 | Discharge: 2012-03-20 | Disposition: A | Discharge status: Home / Self Care | Payer: Medicare Other | Attending: Emergency Medicine | Admitting: Emergency Medicine

## 2012-03-19 ENCOUNTER — Encounter (HOSPITAL_COMMUNITY): Payer: Self-pay | Admitting: *Deleted

## 2012-03-19 DIAGNOSIS — R451 Restlessness and agitation: Secondary | ICD-10-CM

## 2012-03-19 DIAGNOSIS — I10 Essential (primary) hypertension: Secondary | ICD-10-CM | POA: Insufficient documentation

## 2012-03-19 DIAGNOSIS — Z9181 History of falling: Secondary | ICD-10-CM | POA: Insufficient documentation

## 2012-03-19 DIAGNOSIS — R296 Repeated falls: Secondary | ICD-10-CM

## 2012-03-19 DIAGNOSIS — IMO0002 Reserved for concepts with insufficient information to code with codable children: Secondary | ICD-10-CM | POA: Insufficient documentation

## 2012-03-19 DIAGNOSIS — R269 Unspecified abnormalities of gait and mobility: Secondary | ICD-10-CM | POA: Insufficient documentation

## 2012-03-19 DIAGNOSIS — I672 Cerebral atherosclerosis: Secondary | ICD-10-CM | POA: Insufficient documentation

## 2012-03-19 DIAGNOSIS — R4182 Altered mental status, unspecified: Secondary | ICD-10-CM | POA: Insufficient documentation

## 2012-03-19 DIAGNOSIS — F0152 Vascular dementia, unspecified severity, with psychotic disturbance: Secondary | ICD-10-CM | POA: Insufficient documentation

## 2012-03-19 DIAGNOSIS — Z79899 Other long term (current) drug therapy: Secondary | ICD-10-CM | POA: Insufficient documentation

## 2012-03-19 DIAGNOSIS — F22 Delusional disorders: Secondary | ICD-10-CM | POA: Insufficient documentation

## 2012-03-19 DIAGNOSIS — G2581 Restless legs syndrome: Secondary | ICD-10-CM | POA: Insufficient documentation

## 2012-03-19 DIAGNOSIS — R5381 Other malaise: Secondary | ICD-10-CM | POA: Insufficient documentation

## 2012-03-19 DIAGNOSIS — E039 Hypothyroidism, unspecified: Secondary | ICD-10-CM | POA: Insufficient documentation

## 2012-03-19 LAB — CBC WITH DIFFERENTIAL/PLATELET
Basophils Absolute: 0.1 10*3/uL (ref 0.0–0.1)
Basophils Relative: 1 % (ref 0–1)
Eosinophils Absolute: 0.1 10*3/uL (ref 0.0–0.7)
Eosinophils Relative: 1 % (ref 0–5)
HCT: 36.1 % (ref 36.0–46.0)
Hemoglobin: 11.9 g/dL — ABNORMAL LOW (ref 12.0–15.0)
Lymphocytes Relative: 16 % (ref 12–46)
Lymphs Abs: 1.2 10*3/uL (ref 0.7–4.0)
MCH: 32.1 pg (ref 26.0–34.0)
MCHC: 33 g/dL (ref 30.0–36.0)
MCV: 97.3 fL (ref 78.0–100.0)
Monocytes Absolute: 0.4 10*3/uL (ref 0.1–1.0)
Monocytes Relative: 5 % (ref 3–12)
Neutro Abs: 5.9 10*3/uL (ref 1.7–7.7)
Neutrophils Relative %: 78 % — ABNORMAL HIGH (ref 43–77)
Platelets: 358 10*3/uL (ref 150–400)
RBC: 3.71 MIL/uL — ABNORMAL LOW (ref 3.87–5.11)
RDW: 16.7 % — ABNORMAL HIGH (ref 11.5–15.5)
WBC: 7.7 10*3/uL (ref 4.0–10.5)

## 2012-03-19 LAB — URINALYSIS, ROUTINE W REFLEX MICROSCOPIC
Glucose, UA: NEGATIVE mg/dL
Hgb urine dipstick: NEGATIVE
Ketones, ur: 15 mg/dL — AB
Leukocytes, UA: NEGATIVE
Nitrite: NEGATIVE
Protein, ur: NEGATIVE mg/dL
Specific Gravity, Urine: 1.023 (ref 1.005–1.030)
Urobilinogen, UA: 1 mg/dL (ref 0.0–1.0)
pH: 6 (ref 5.0–8.0)

## 2012-03-19 LAB — COMPREHENSIVE METABOLIC PANEL
ALT: 23 U/L (ref 0–35)
AST: 26 U/L (ref 0–37)
Albumin: 2.9 g/dL — ABNORMAL LOW (ref 3.5–5.2)
Alkaline Phosphatase: 162 U/L — ABNORMAL HIGH (ref 39–117)
BUN: 22 mg/dL (ref 6–23)
CO2: 24 mEq/L (ref 19–32)
Calcium: 9.8 mg/dL (ref 8.4–10.5)
Chloride: 105 mEq/L (ref 96–112)
Creatinine, Ser: 1.51 mg/dL — ABNORMAL HIGH (ref 0.50–1.10)
GFR calc Af Amer: 36 mL/min — ABNORMAL LOW (ref >90)
GFR calc non Af Amer: 31 mL/min — ABNORMAL LOW (ref >90)
Glucose, Bld: 183 mg/dL — ABNORMAL HIGH (ref 70–99)
Potassium: 2.8 mEq/L — ABNORMAL LOW (ref 3.5–5.1)
Sodium: 143 mEq/L (ref 135–145)
Total Bilirubin: 0.3 mg/dL (ref 0.3–1.2)
Total Protein: 6.2 g/dL (ref 6.0–8.3)

## 2012-03-19 MED ORDER — POTASSIUM CHLORIDE CRYS ER 20 MEQ PO TBCR
40.0000 meq | EXTENDED_RELEASE_TABLET | Freq: Once | ORAL | Status: AC
  Administered 2012-03-19: 40 meq via ORAL
  Filled 2012-03-19: qty 2

## 2012-03-19 MED ORDER — SODIUM CHLORIDE 0.9 % IV BOLUS (SEPSIS)
1000.0000 mL | INTRAVENOUS | Status: AC
  Administered 2012-03-19: 1000 mL via INTRAVENOUS

## 2012-03-19 MED ORDER — SODIUM CHLORIDE 0.9 % IV SOLN
Freq: Once | INTRAVENOUS | Status: AC
  Administered 2012-03-19: 22:00:00 via INTRAVENOUS

## 2012-03-19 MED ORDER — POTASSIUM CHLORIDE 10 MEQ/100ML IV SOLN: 10.0000 meq | Freq: Once | INTRAVENOUS | Status: DC

## 2012-03-19 NOTE — ED Notes (Signed)
Old and new EKG handed to Dr. Kohut.  Extra copies of both placed in pt chart 

## 2012-03-19 NOTE — ED Notes (Signed)
Family states that pt has been falling more frequently at home. Reports hx of utis. Family reports agitation at home. Pt does like alone but she has been staying with daughter for the last couple nights. States she feels like she has not been eating or drinking properly. Pt denies any complaints. Pt is alert and oriented.

## 2012-03-19 NOTE — ED Provider Notes (Signed)
History     CSN: [id number]  Arrival date & time 03/19/12  1719   First MD Initiated Contact with Patient 03/19/12 2159      Chief Complaint  Patient presents with  . Gait Problem  . Agitation    (Consider location/radiation/quality/duration/timing/severity/associated sxs/prior treatment) Patient is a 78 y.o. female presenting with neurologic complaint. The history is provided by the patient and a caregiver.  Neurologic Problem The primary symptoms include altered mental status. Primary symptoms do not include headaches, dizziness, fever, nausea or vomiting. The symptoms began yesterday. The symptoms are resolved. The neurological symptoms are diffuse. Context: near bed time yesterday.  The change in mental status began yesterday. The altered mental status developed suddenly. The change in mental status has been resolved since its onset. Episode Character: agitation.    Past Medical History  Diagnosis Date  . ALLERGIC RHINITIS 02/04/2007  . Altered mental status 11/05/2008  . ANEMIA-IRON DEFICIENCY 08/11/2008  . ANEMIA 06/30/2008  . ANXIETY 08/11/2008  . FREQUENCY, URINARY 04/28/2008  . GERD 02/04/2007  . HYPERTENSION 02/04/2007  . Hypopotassemia 11/08/2008  . HYPOTHYROIDISM 02/04/2007  . LOW BACK PAIN 12/31/2007  . OSTEOARTHRITIS 12/31/2007  . OSTEOPOROSIS 02/04/2007  . PRURITUS 06/03/2009  . RESTLESS LEG SYNDROME 03/18/2009  . Vascular dementia with delusions 11/25/2008  . WEAKNESS 11/05/2008  . WEIGHT LOSS 11/05/2008    Past Surgical History  Procedure Date  . Hemorrhoid surgery   . Abdominal hysterectomy   . Tonsillectomy   . Knee surgery     right  . Laminectomy   . Back surgery   . Tonsillectomy   . Knee arthroscopy     Right  . Appendectomy   . Lumbar laminectomy/decompression microdiscectomy 12/31/2011    Procedure: LUMBAR LAMINECTOMY/DECOMPRESSION MICRODISCECTOMY 1 LEVEL;  Surgeon: Cristi Loron, MD;  Location: [facility];  Service: Neurosurgery;  Laterality:  Left;  Left Lumbar Four-Five Discectomy redo     History reviewed. No pertinent family history.  History  Substance Use Topics  . Smoking status: Former Smoker    Quit date: 07/16/1988  . Smokeless tobacco: Never Used  . Alcohol Use: No    OB History    Grav Para Term Preterm Abortions TAB SAB Ect Mult Living                  Review of Systems  Constitutional: Negative for fever and fatigue.  HENT: Negative for congestion, drooling and neck pain.   Eyes: Negative for pain.  Respiratory: Negative for cough and shortness of breath.   Cardiovascular: Negative for chest pain.  Gastrointestinal: Negative for nausea, vomiting, abdominal pain and diarrhea.  Genitourinary: Negative for dysuria and hematuria.  Musculoskeletal: Negative for back pain.  Skin: Negative for color change.  Neurological: Negative for dizziness and headaches.  Hematological: Negative for adenopathy.  Psychiatric/Behavioral: Positive for agitation and altered mental status.  All other systems reviewed and are negative.    Allergies  Ciprofloxacin; Diazepam; and Meperidine hcl  Home Medications   Current Outpatient Rx  Name Route Sig Dispense Refill  . AMLODIPINE BESYLATE 10 MG PO TABS Oral Take 1 tablet (10 mg total) by mouth daily. 90 tablet 6  . ASPIRIN 81 MG PO TABS Oral Take 81 mg by mouth daily.      . BUPROPION HCL ER (XL) 150 MG PO TB24 Oral Take 1 tablet (150 mg total) by mouth daily. 60 tablet 3  . CALCIUM + D PO Oral Take 1 tablet by mouth 2 (  two) times daily.    . DONEPEZIL HCL 5 MG PO TABS Oral Take 1 tablet (5 mg total) by mouth at bedtime. 90 tablet 2  . FEXOFENADINE HCL 180 MG PO TABS Oral Take 1 tablet (180 mg total) by mouth daily. 90 tablet 6  . FLUOXETINE HCL 40 MG PO CAPS Oral Take 1 capsule (40 mg total) by mouth daily. 90 capsule 3  . FOLIC ACID-VIT B6-VIT B12 0.8-10-0.115 MG PO TABS Oral Take 1 tablet by mouth daily.    [name] LANSOPRAZOLE 30 MG PO CPDR Oral Take 1 capsule (30 mg  total) by mouth daily. 90 capsule 6  . LEVOTHYROXINE SODIUM 50 MCG PO TABS Oral Take 1 tablet (50 mcg total) by mouth daily. 90 tablet 6  . LOSARTAN POTASSIUM 100 MG PO TABS Oral Take 1 tablet (100 mg total) by mouth daily. 90 tablet 4  . MEMANTINE HCL 10 MG PO TABS Oral Take 1 tablet (10 mg total) by mouth 2 (two) times daily. 180 tablet 1  . OXYBUTYNIN CHLORIDE ER 10 MG PO TB24 Oral Take 1 tablet (10 mg total) by mouth daily. 90 tablet 6  . TRAMADOL HCL 50 MG PO TABS Oral Take 50 mg by mouth 2 (two) times daily.      BP 122/55  Pulse 88  Temp 98.3 F (36.8 C) (Oral)  Resp 18  SpO2 96%  Physical Exam  Nursing note and vitals reviewed. Constitutional: She is oriented to person, place, and time. She appears well-developed and well-nourished.  HENT:  Head: Normocephalic.  Mouth/Throat: Oropharynx is clear and moist. No oropharyngeal exudate.  Eyes: Conjunctivae and EOM are normal. Pupils are equal, round, and reactive to light.  Neck: Normal range of motion. Neck supple.  Cardiovascular: Normal rate, regular rhythm, normal heart sounds and intact distal pulses.  Exam reveals no gallop and no friction rub.   No murmur heard. Pulmonary/Chest: Effort normal and breath sounds normal. No respiratory distress. She has no wheezes.  Abdominal: Soft. Bowel sounds are normal. There is no tenderness. There is no rebound and no guarding.  Musculoskeletal: Normal range of motion. She exhibits no edema and no tenderness.  Neurological: She is alert and oriented to person, place, and time. She has normal strength. No cranial nerve deficit or sensory deficit. Coordination normal.       Pt states she feels unsteady when standing and does not want to ambulate on exam.   Skin: Skin is warm and dry.  Psychiatric: She has a normal mood and affect. Her behavior is normal. Judgment and thought content normal.    ED Course  Procedures (including critical care time)  Labs Reviewed  URINALYSIS, ROUTINE W  REFLEX MICROSCOPIC - Abnormal; Notable for the following:    Color, Urine AMBER (*)  BIOCHEMICALS MAY BE AFFECTED BY COLOR   Bilirubin Urine MODERATE (*)     Ketones, ur 15 (*)     All other components within normal limits  CBC WITH DIFFERENTIAL - Abnormal; Notable for the following:    RBC 3.71 (*)     Hemoglobin 11.9 (*)     RDW 16.7 (*)     Neutrophils Relative 78 (*)     All other components within normal limits  COMPREHENSIVE METABOLIC PANEL - Abnormal; Notable for the following:    Potassium 2.8 (*)     Glucose, Bld 183 (*)     Creatinine, Ser 1.51 (*)     Albumin 2.9 (*)     Alkaline  Phosphatase 162 (*)     GFR calc non Af Amer 31 (*)     GFR calc Af Amer 36 (*)     All other components within normal limits   Ct Head Wo Contrast  03/19/2012  *RADIOLOGY REPORT*  Clinical Data: Frequent falls with agitation.  CT HEAD WITHOUT CONTRAST  Technique:  Contiguous axial images were obtained from the base of the skull through the vertex without contrast.  Comparison: Head CT 09/16/2010.  MRI brain 09/17/2010.  Findings: Some images were repeated due to motion. There is no evidence of acute intracranial hemorrhage, mass effect, brain edema or extra-axial fluid collection.  The ventricles and subarachnoid spaces are diffusely prominent consistent with moderate atrophy. Diffuse periventricular low density is most compatible with moderate chronic small vessel ischemic change.   No acute cortical infarction is seen.  The visualized paranasal sinuses are clear. The calvarium is intact.  IMPRESSION: Stable atrophy and chronic small vessel ischemic changes.  No acute intracranial findings identified.   Original Report Authenticated By: Gerrianne Scale, M.D.    Dg Chest Port 1 View  03/19/2012  *RADIOLOGY REPORT*  Clinical Data: Altered mental status, weakness, fatigue, history hypertension and smoking  PORTABLE CHEST - 1 VIEW  Comparison: Portable exam 2223 hours compared to 12/26/2011  Findings:  Upper-normal size of cardiac silhouette. Calcified tortuous aorta. Pulmonary vascularity normal. Minimal bronchitic changes and right basilar atelectasis. Remaining lungs clear. No pleural effusion or pneumothorax. No acute osseous findings. Mildly rotated to the left.  IMPRESSION: Minimal bronchitic changes and chronic right basilar atelectasis.   Original Report Authenticated By: Lollie Marrow, M.D.      1. Agitation   2. Falls frequently      Date: 03/19/2012  Rate: 87  Rhythm: sinus arrhythmia  QRS Axis: left  Intervals: normal  ST/T Wave abnormalities: normal  Conduction Disutrbances:none  Narrative Interpretation: No new ST or T wave changes cw ischemia  Old EKG Reviewed: unchanged    MDM  12:38 AM 78 y.o. female w hx of vascular dementia w/ delusions pw agitation. The family notes that the pt lives alone and has not been taking her meds or eating meals. She has been living with a daughter since yesterday who notes the pt had mild agitation and aggression yesterday evening. The family also notes frequent falls, typically onto her bottom. Pt AFVSS here, A/Ox3, no motor/sensory deficits noted on exam. Pt states she is too unstable to walk on exam, she normally ambulates w/ a walker.    12:38 AM: Pt continues to appear well on exam, mentating well. Found to have borderline elev Cr and mild hypokalemia. Will give po potassium here. I have discussed the diagnosis/risks/treatment options with the family and believe the pt to be eligible for discharge home to follow-up with pcp tomorrow to further discuss placment. Pt will go home w/ family who is able to take care of her. We also discussed returning to the ED immediately if new or worsening sx occur. We discussed the sx which are most concerning (e.g., further falls, worsening mental status) that necessitate immediate return. Any new prescriptions provided to the patient are listed below.  New Prescriptions   No medications on file     Clinical Impression 1. Agitation   2. Falls frequently       Purvis Sheffield, MD 03/20/12 [id number]

## 2012-03-20 ENCOUNTER — Other Ambulatory Visit: Payer: Self-pay | Admitting: Internal Medicine

## 2012-03-20 ENCOUNTER — Telehealth: Payer: Self-pay

## 2012-03-20 DIAGNOSIS — R296 Repeated falls: Secondary | ICD-10-CM

## 2012-03-20 DIAGNOSIS — R451 Restlessness and agitation: Secondary | ICD-10-CM | POA: Diagnosis present

## 2012-03-20 DIAGNOSIS — R5381 Other malaise: Secondary | ICD-10-CM

## 2012-03-20 NOTE — Telephone Encounter (Signed)
ok 

## 2012-03-20 NOTE — Telephone Encounter (Signed)
done

## 2012-03-20 NOTE — Telephone Encounter (Signed)
Spoke with [name] - daughter - at [phone] - they had declined earlier referral for homehealth for eval safety and assistance - they wanted to give her alittle more time but was seen in ER yesterday - ??UTI - checked out ok , but had a "violent" episode per daughter - they have discussed and want to put eval back in place - will Inform dr. Amador Cunas and get ok to re-order.

## 2012-03-27 NOTE — ED Provider Notes (Signed)
I saw and evaluated the patient, reviewed the resident's note and I agree with the findings and plan.  78 year old female with increased agitation and recent falls. On my exam patient is in no acute distress. She is alert and oriented x3. She is mentating well. Workup fairly unremarkable aside from hypokalemia. Potassium was repleted. On elevation in creatinine. Lungs are clear with good air movement. Heart is regular neck did not appreciate a murmur. Abdomen is benign. No focal neuro deficits noted. Feel that patient safe for discharge at this time. Return precautions discussed. Outpatient followup.  Raeford Razor, MD 03/27/12 [phone]

## 2012-04-17 DIAGNOSIS — F411 Generalized anxiety disorder: Secondary | ICD-10-CM

## 2012-04-17 DIAGNOSIS — I10 Essential (primary) hypertension: Secondary | ICD-10-CM

## 2012-04-17 DIAGNOSIS — D509 Iron deficiency anemia, unspecified: Secondary | ICD-10-CM

## 2012-04-17 DIAGNOSIS — F22 Delusional disorders: Secondary | ICD-10-CM

## 2012-04-17 DIAGNOSIS — F0151 Vascular dementia with behavioral disturbance: Secondary | ICD-10-CM

## 2012-08-07 ENCOUNTER — Other Ambulatory Visit: Payer: Self-pay | Admitting: Internal Medicine

## 2012-08-10 ENCOUNTER — Other Ambulatory Visit: Payer: Self-pay | Admitting: Internal Medicine

## 2012-09-25 ENCOUNTER — Other Ambulatory Visit: Payer: Self-pay | Admitting: *Deleted

## 2012-09-25 MED ORDER — DONEPEZIL HCL 5 MG PO TABS: 5.0000 mg | ORAL_TABLET | Freq: Every day | ORAL | Status: DC

## 2012-10-08 ENCOUNTER — Other Ambulatory Visit: Payer: Self-pay | Admitting: Internal Medicine

## 2012-10-28 ENCOUNTER — Other Ambulatory Visit: Payer: Self-pay | Admitting: Internal Medicine

## 2012-11-28 ENCOUNTER — Other Ambulatory Visit: Payer: Self-pay | Admitting: Internal Medicine

## 2012-12-12 ENCOUNTER — Encounter: Payer: Self-pay | Admitting: Internal Medicine

## 2012-12-12 ENCOUNTER — Ambulatory Visit (INDEPENDENT_AMBULATORY_CARE_PROVIDER_SITE_OTHER): Payer: Medicare Other | Admitting: Internal Medicine

## 2012-12-12 VITALS — BP 110/70 | HR 102 | Temp 100.2°F | Resp 22 | Wt 141.0 lb

## 2012-12-12 DIAGNOSIS — I10 Essential (primary) hypertension: Secondary | ICD-10-CM

## 2012-12-12 MED ORDER — AZITHROMYCIN 250 MG PO TABS: ORAL_TABLET | ORAL | Status: DC

## 2012-12-12 NOTE — Patient Instructions (Addendum)
Take over-the-counter expectorants and cough medications such as  Mucinex DM.  Call if there is no improvement in 5 to 7 days or if he developed worsening cough, fever, or new symptoms, such as shortness of breath or chest pain.  Call or return to clinic prn if these symptoms worsen or fail to improve as anticipated.  Take [phone] mg of Tylenol every 6 hours as needed for pain relief or fever.  Avoid taking more than 3000 mg in a 24-hour period (  This may cause liver damage).

## 2012-12-12 NOTE — Progress Notes (Signed)
Subjective:    Patient ID: Jennifer [name], female    DOB: February 05, 1929, 79 y.o.   MRN: [id number]  HPI  79 year old patient who presents with a four-day history of chest congestion and cough she's had fever and a general sense of unwellness. No wheezing. Denies any chills. She does complain of fatigue and poor appetite  Past Medical History  Diagnosis Date  . ALLERGIC RHINITIS 02/04/2007  . Altered mental status 11/05/2008  . ANEMIA-IRON DEFICIENCY 08/11/2008  . ANEMIA 06/30/2008  . ANXIETY 08/11/2008  . FREQUENCY, URINARY 04/28/2008  . GERD 02/04/2007  . HYPERTENSION 02/04/2007  . Hypopotassemia 11/08/2008  . HYPOTHYROIDISM 02/04/2007  . LOW BACK PAIN 12/31/2007  . OSTEOARTHRITIS 12/31/2007  . OSTEOPOROSIS 02/04/2007  . PRURITUS 06/03/2009  . RESTLESS LEG SYNDROME 03/18/2009  . Vascular dementia with delusions 11/25/2008  . WEAKNESS 11/05/2008  . WEIGHT LOSS 11/05/2008    History   Social History  . Marital Status: Widowed    Spouse Name: N/A    Number of Children: N/A  . Years of Education: N/A   Occupational History  . Not on file.   Social History Main Topics  . Smoking status: Former Smoker    Quit date: 07/16/1988  . Smokeless tobacco: Never Used  . Alcohol Use: No  . Drug Use: No  . Sexually Active: Not on file   Other Topics Concern  . Not on file   Social History Narrative  . No narrative on file    Past Surgical History  Procedure Laterality Date  . Hemorrhoid surgery    . Abdominal hysterectomy    . Tonsillectomy    . Knee surgery      right  . Laminectomy    . Back surgery    . Tonsillectomy    . Knee arthroscopy      Right  . Appendectomy    . Lumbar laminectomy/decompression microdiscectomy  12/31/2011    Procedure: LUMBAR LAMINECTOMY/DECOMPRESSION MICRODISCECTOMY 1 LEVEL;  Surgeon: Cristi Loron, MD;  Location: [facility];  Service: Neurosurgery;  Laterality: Left;  Left Lumbar Four-Five Discectomy redo     No family history on  file.  Allergies  Allergen Reactions  . Ciprofloxacin     REACTION: itching  . Diazepam     REACTION: unspecified  . Meperidine Hcl     REACTION: unspecified    Current Outpatient Prescriptions on File Prior to Visit  Medication Sig Dispense Refill  . amLODipine (NORVASC) 10 MG tablet TAKE 1 TABLET BY MOUTH EVERY DAY  90 tablet  1  . aspirin 81 MG tablet Take 81 mg by mouth daily.        [name] buPROPion (WELLBUTRIN XL) 150 MG 24 hr tablet Take 1 tablet (150 mg total) by mouth daily.  60 tablet  3  . Calcium Carbonate-Vitamin D (CALCIUM + D PO) Take 1 tablet by mouth 2 (two) times daily.      [name] donepezil (ARICEPT) 5 MG tablet Take 1 tablet (5 mg total) by mouth at bedtime.  90 tablet  2  . fexofenadine (ALLEGRA) 180 MG tablet Take 1 tablet (180 mg total) by mouth daily.  90 tablet  6  . FLUoxetine (PROZAC) 40 MG capsule TAKE ONE CAPSULE BY MOUTH EVERY DAY  90 capsule  0  . Folic Acid-Vit B6-Vit B12 (FOLTABS 800) 0.8-10-0.115 MG TABS Take 1 tablet by mouth daily.      . lansoprazole (PREVACID) 30 MG capsule Take 1 capsule (30 mg total) by  mouth daily.  90 capsule  6  . levothyroxine (SYNTHROID, LEVOTHROID) 50 MCG tablet TAKE 1 TABLET BY MOUTH EVERY DAY  90 tablet  1  . losartan (COZAAR) 100 MG tablet TAKE 1 TABLET (100 MG TOTAL) BY MOUTH DAILY.  90 tablet  0  . memantine (NAMENDA) 10 MG tablet Take 1 tablet (10 mg total) by mouth 2 (two) times daily.  180 tablet  1  . NAMENDA 10 MG tablet TAKE 1 TABLET BY MOUTH TWICE A DAY  60 tablet  0  . NAMENDA 10 MG tablet TAKE 1 TABLET BY MOUTH TWICE A DAY  180 tablet  1  . oxybutynin (DITROPAN-XL) 10 MG 24 hr tablet Take 1 tablet (10 mg total) by mouth daily.  90 tablet  6  . traMADol (ULTRAM) 50 MG tablet Take 50 mg by mouth 2 (two) times daily.       No current facility-administered medications on file prior to visit.    BP 110/70  Pulse 102  Temp(Src) 100.2 F (37.9 C) (Oral)  Resp 22  Wt 141 lb (63.957 kg)  BMI 22.77 kg/m2  SpO2  94%       Review of Systems  Constitutional: Positive for fever and fatigue.  HENT: Negative for hearing loss, congestion, sore throat, rhinorrhea, dental problem, sinus pressure and tinnitus.   Eyes: Negative for pain, discharge and visual disturbance.  Respiratory: Positive for cough. Negative for shortness of breath.   Cardiovascular: Negative for chest pain, palpitations and leg swelling.  Gastrointestinal: Negative for nausea, vomiting, abdominal pain, diarrhea, constipation, blood in stool and abdominal distention.  Genitourinary: Negative for dysuria, urgency, frequency, hematuria, flank pain, vaginal bleeding, vaginal discharge, difficulty urinating, vaginal pain and pelvic pain.  Musculoskeletal: Negative for joint swelling, arthralgias and gait problem.  Skin: Negative for rash.  Neurological: Positive for weakness. Negative for dizziness, syncope, speech difficulty, numbness and headaches.  Hematological: Negative for adenopathy.  Psychiatric/Behavioral: Negative for behavioral problems, dysphoric mood and agitation. The patient is not nervous/anxious.        Objective:   Physical Exam  Constitutional: She is oriented to person, place, and time. She appears well-developed and well-nourished.  Temperature 100.2 Pulse 100 O2 saturation 94%  HENT:  Head: Normocephalic.  Right Ear: External ear normal.  Left Ear: External ear normal.  Mouth/Throat: Oropharynx is clear and moist.  Eyes: Conjunctivae and EOM are normal. Pupils are equal, round, and reactive to light.  Neck: Normal range of motion. Neck supple. No thyromegaly present.  Cardiovascular: Normal rate, regular rhythm, normal heart sounds and intact distal pulses.   Pulmonary/Chest: Effort normal and breath sounds normal.  A few crackles heard  left anterior chest  Chest is clear posteriorly  Abdominal: Soft. Bowel sounds are normal. She exhibits no mass. There is no tenderness.  Musculoskeletal: Normal range  of motion.  Lymphadenopathy:    She has no cervical adenopathy.  Neurological: She is alert and oriented to person, place, and time.  Skin: Skin is warm and dry. No rash noted.  Psychiatric: She has a normal mood and affect. Her behavior is normal.          Assessment & Plan:   Acute febrile illness. Cannot exclude  early community-acquired pneumonia (it is 5:00 Friday afternoon and x-ray not available). Will treat with expectorants and azithromycin as well as Tylenol for fever. She will call if unimproved Hypertension stable

## 2012-12-15 ENCOUNTER — Ambulatory Visit: Payer: Medicare Other | Admitting: Internal Medicine

## 2012-12-15 ENCOUNTER — Telehealth: Payer: Self-pay | Admitting: Internal Medicine

## 2012-12-15 NOTE — Telephone Encounter (Signed)
Appt sched w/ Elam NP. Daughter aware.

## 2012-12-15 NOTE — Telephone Encounter (Signed)
Patient Information:  Caller Name: Aram Beecham  Phone: [phone]  Patient: Jennifer, [name]  Gender: Female  DOB: [birth year]  Age: 79 Years  PCP: Eleonore Chiquito [facility])  Office Follow Up:  Does the office need to follow up with this patient?: Yes  Instructions For The Office: PLS CALL BACK FOR APPT NEEDS  RN Note:  Pt was seen on 5-30 late in afternoon for Cough, no xray available due to time of day, MD treated for possible pneumonia, Zithromycin and Mucinex advised and given.  Pt called daughter to let her know she is not doing better after treatment.  Pt complains of body aches.  Pt is not w/ Daughter for full assessment. Appt scheduled for re-evaluation.  Symptoms  Reason For Call & Symptoms: Pneumonia F/U  Reviewed Health History In EMR: Yes  Reviewed Medications In EMR: Yes  Reviewed Allergies In EMR: Yes  Reviewed Surgeries / Procedures: Yes  Date of Onset of Symptoms: 12/12/2012  Treatments Tried: Zithromycin, Mucinex  Treatments Tried Worked: No  Guideline(s) Used:  No Protocol Available - Sick Adult  Disposition Per Guideline:   See Today in Office  Reason For Disposition Reached:   Nursing judgment  Advice Given:  N/A  Patient Will Follow Care Advice:  YES

## 2013-01-21 ENCOUNTER — Other Ambulatory Visit: Payer: Self-pay | Admitting: Internal Medicine

## 2013-02-03 ENCOUNTER — Other Ambulatory Visit: Payer: Self-pay | Admitting: Internal Medicine

## 2013-02-15 IMAGING — CT CT HEAD W/O CM
1 of 2 series · 13 of 30 positions shown, 17 images · non-contrast
Comparison: Head CT 09/16/2010.  MRI brain 09/17/2010.

CLINICAL DATA: Frequent falls with agitation.

CT HEAD WITHOUT CONTRAST
TECHNIQUE: Contiguous axial images were obtained from the base of
the skull through the vertex without contrast.

[Series 2: head trauma 4.8 h37s · axial · 0.45mm/px · z∈[-98,+47]mm · 13 of 36 slices shown, 17 images]
[im 3/36  brain]
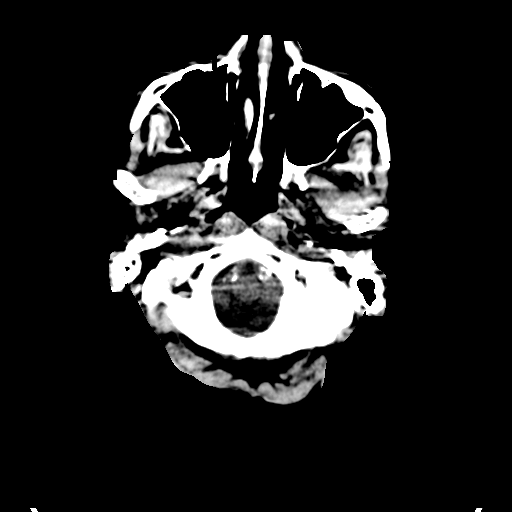
[im 3/36  bone]
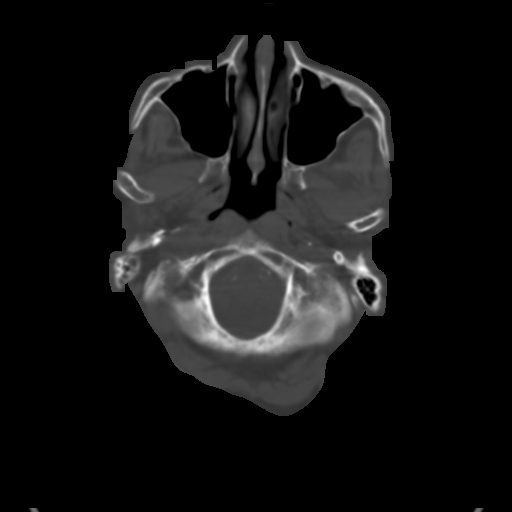
[im 6/36  brain]
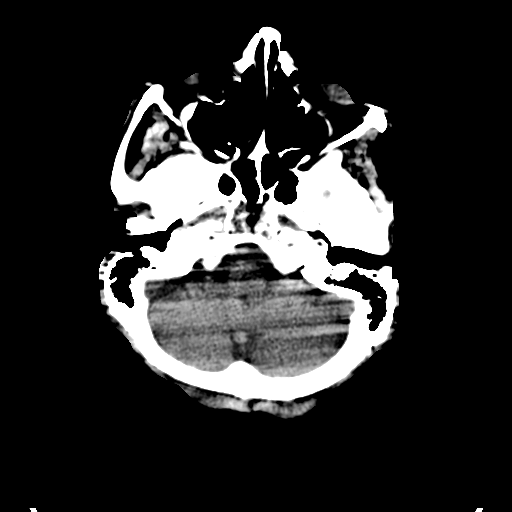
[im 8/36  brain]
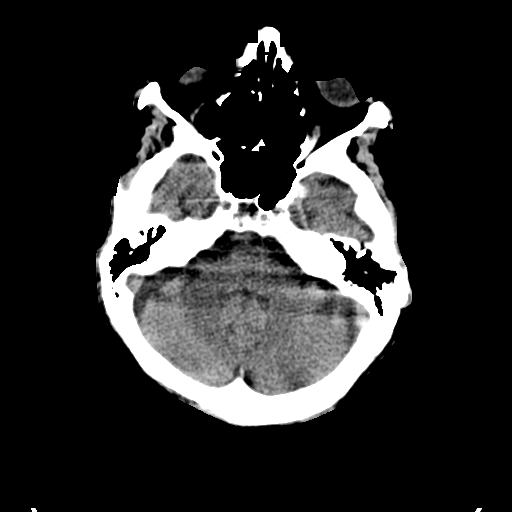
[im 11/36  brain]
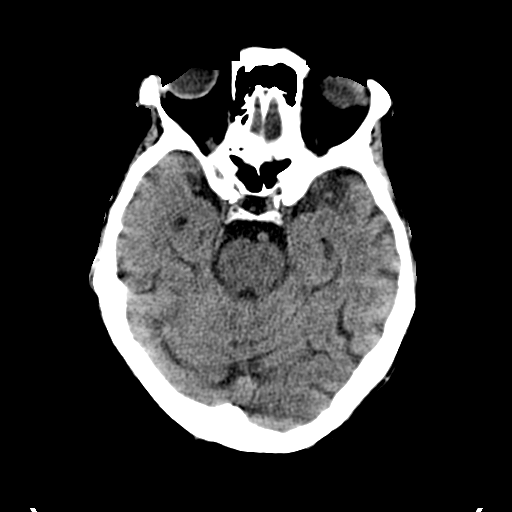
[im 13/36  brain]
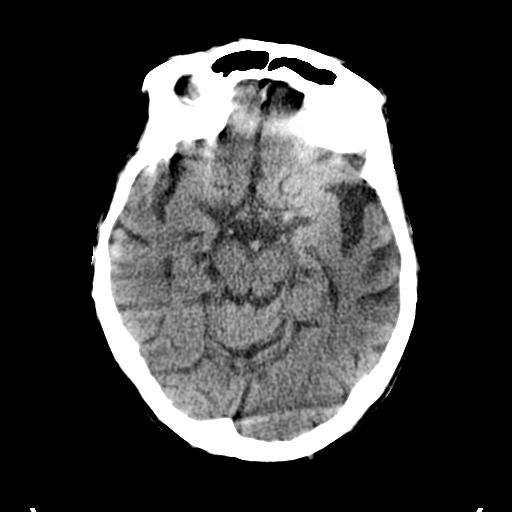
[im 13/36  bone]
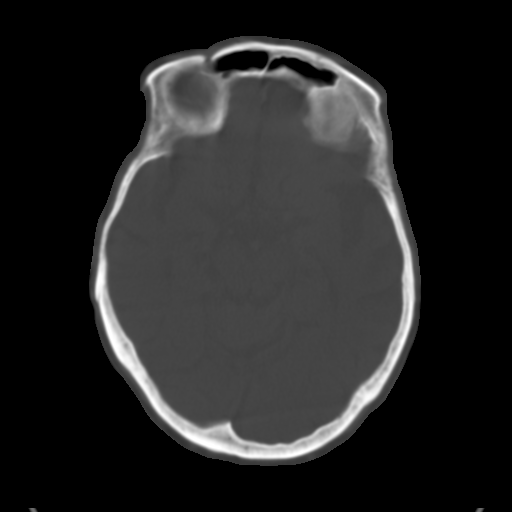
[im 16/36  brain]
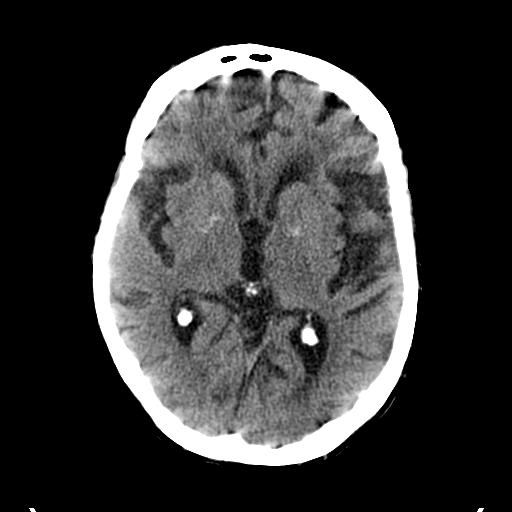
[im 18/36  brain]
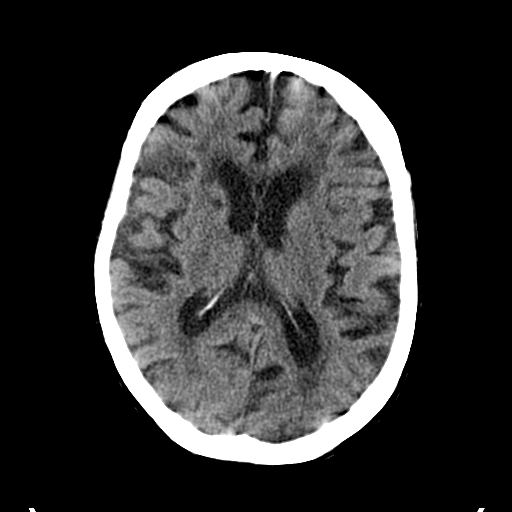
[im 21/36  brain]
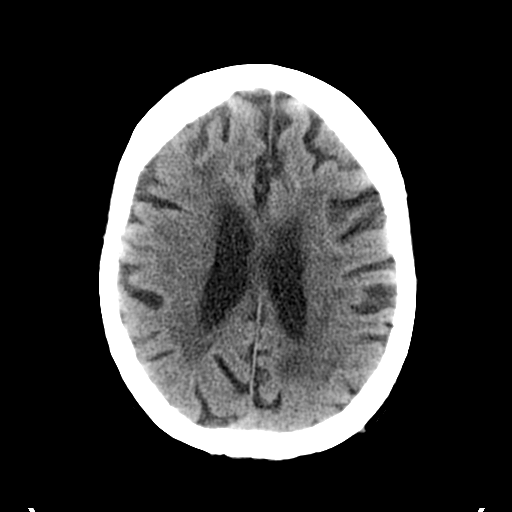
[im 23/36  brain]
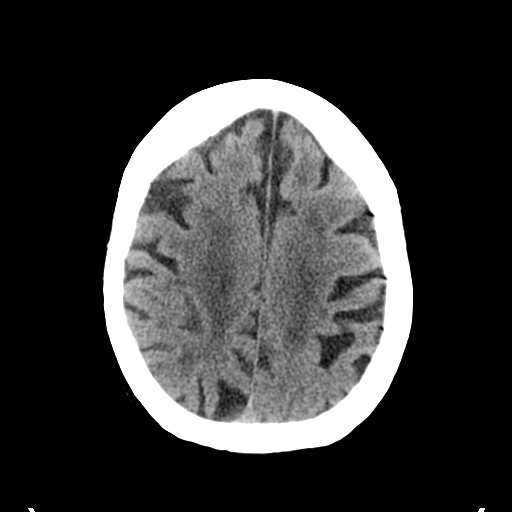
[im 23/36  bone]
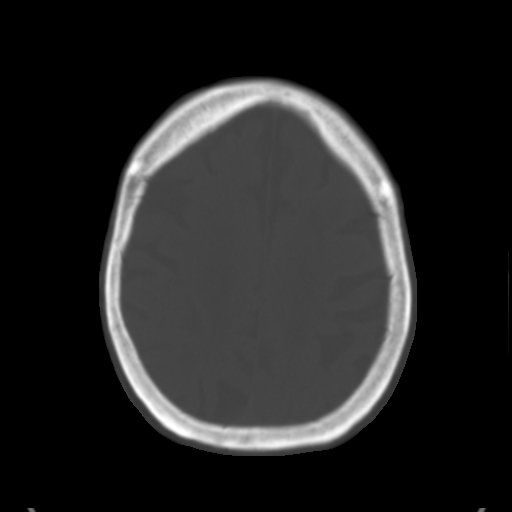
[im 26/36  brain]
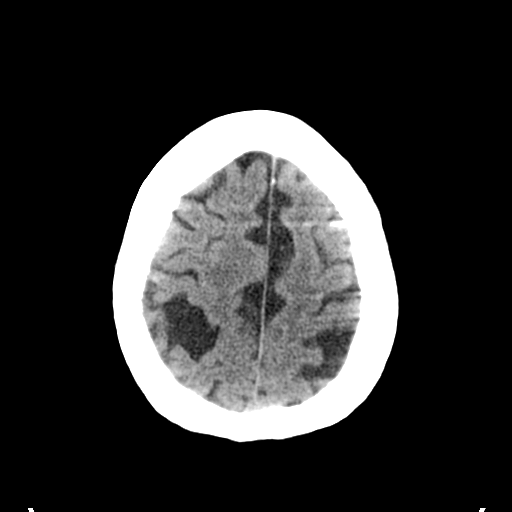
[im 28/36  brain]
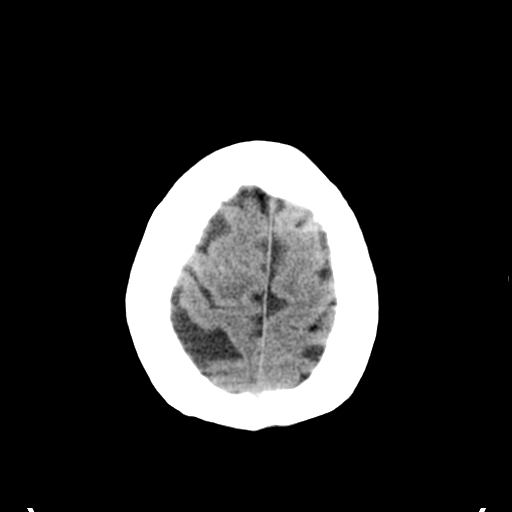
[im 31/36  brain]
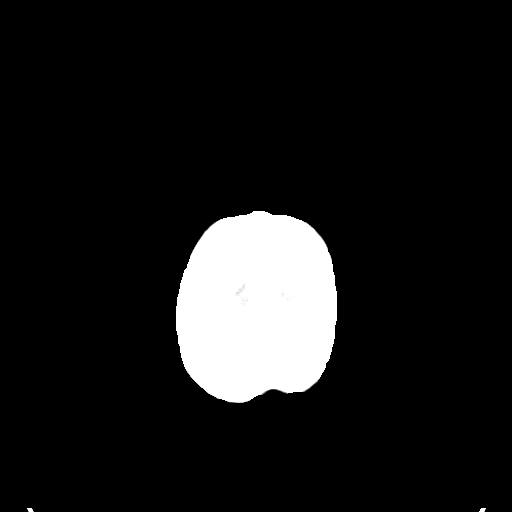
[im 33/36  brain]
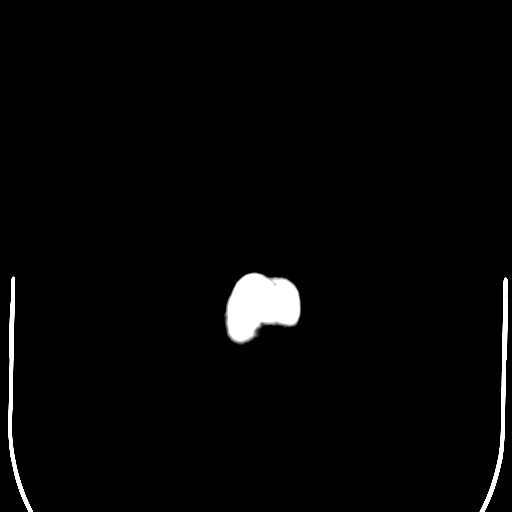
[im 33/36  bone]
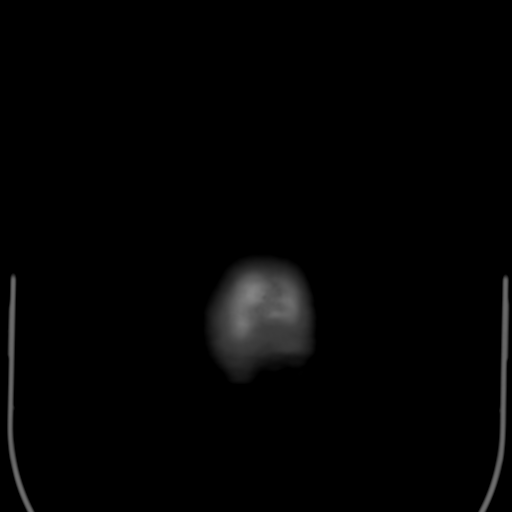

[13 of 30 positions shown; findings below may reference images not displayed]

FINDINGS: Some images were repeated due to motion. There is no
evidence of acute intracranial hemorrhage, mass effect, brain edema
or extra-axial fluid collection.  The ventricles and subarachnoid
spaces are diffusely prominent consistent with moderate atrophy.
Diffuse periventricular low density is most compatible with
moderate chronic small vessel ischemic change.   No acute cortical
infarction is seen.

The visualized paranasal sinuses are clear. The calvarium is
intact.
IMPRESSION: Stable atrophy and chronic small vessel ischemic changes.  No acute
intracranial findings identified.

## 2013-02-15 IMAGING — CR DG CHEST 1V PORT
1 series · 1 of 1 positions shown · non-contrast
Comparison: Portable exam 9997 hours compared to 12/26/2011

CLINICAL DATA: Altered mental status, weakness, fatigue, history
hypertension and smoking

PORTABLE CHEST - 1 VIEW

[AP]
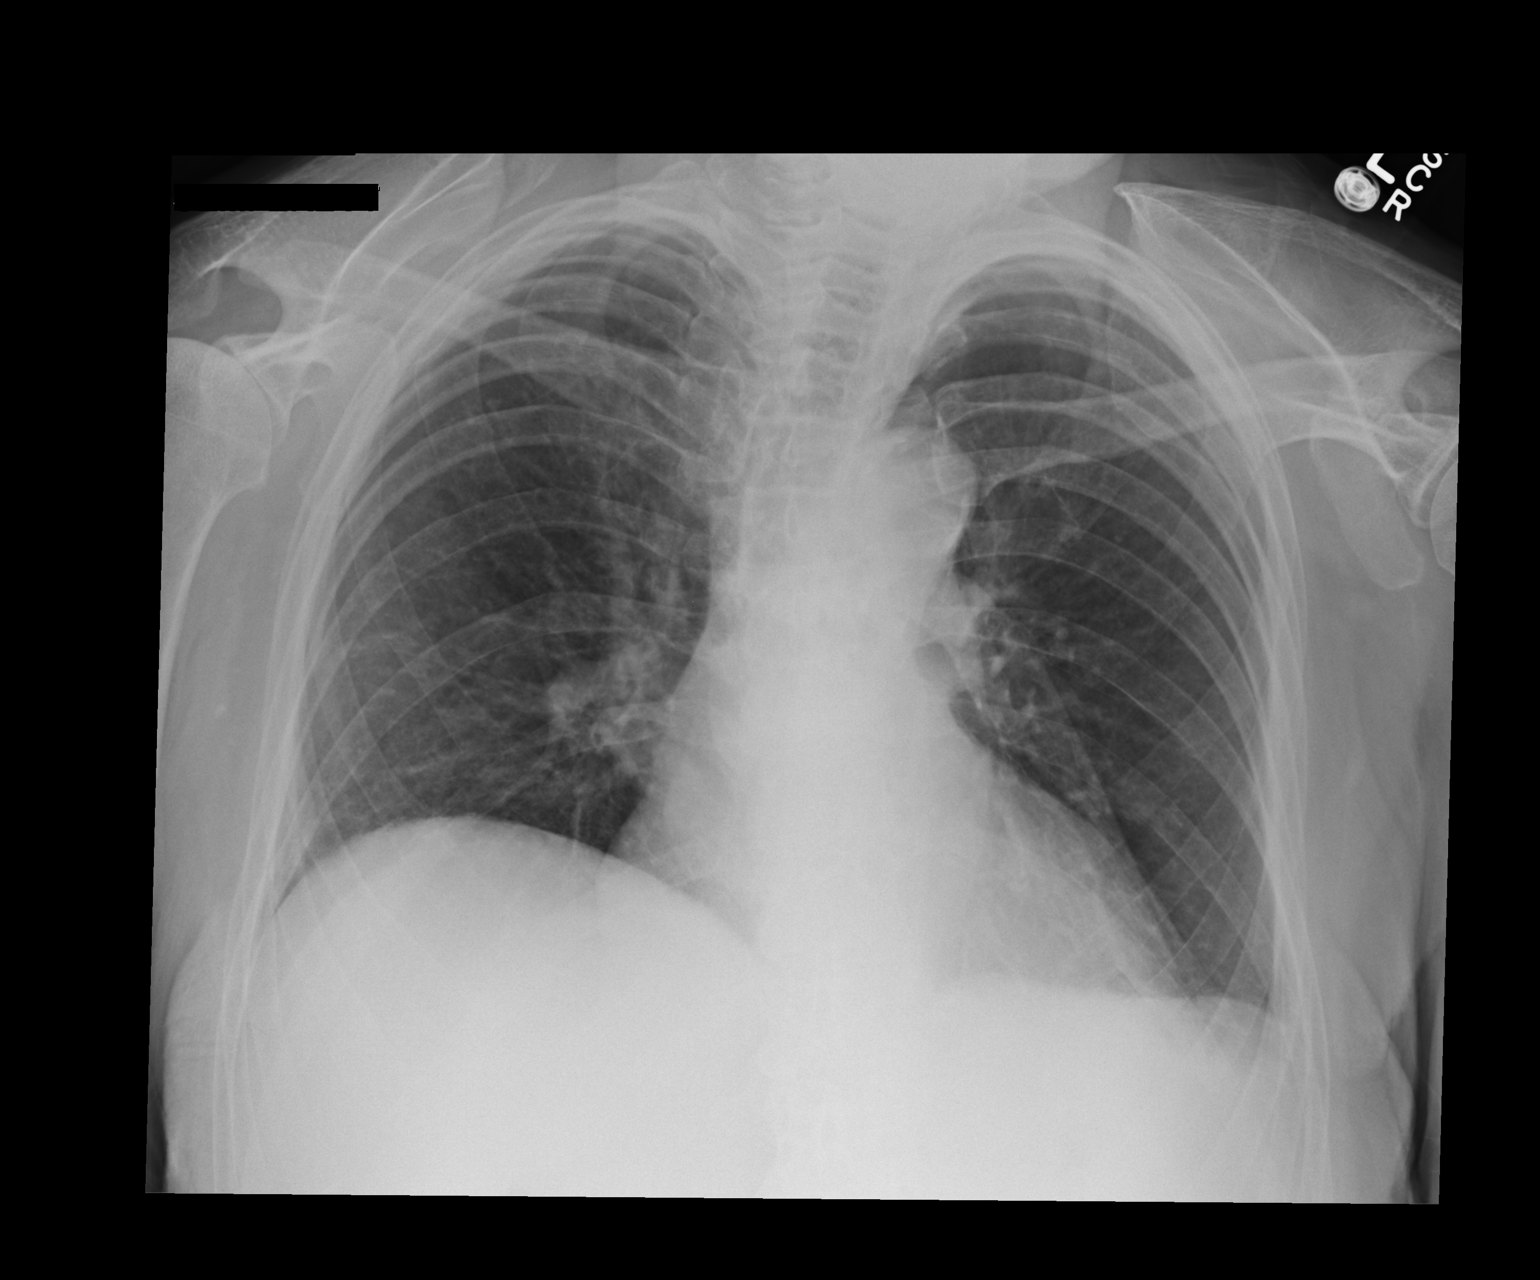

[1 of 1 positions shown; findings below may reference images not displayed]

FINDINGS: Upper-normal size of cardiac silhouette.
Calcified tortuous aorta.
Pulmonary vascularity normal.
Minimal bronchitic changes and right basilar atelectasis.
Remaining lungs clear.
No pleural effusion or pneumothorax.
No acute osseous findings.
Mildly rotated to the left.
IMPRESSION: Minimal bronchitic changes and chronic right basilar atelectasis.

## 2013-03-11 ENCOUNTER — Other Ambulatory Visit: Payer: Self-pay | Admitting: Internal Medicine

## 2013-03-19 ENCOUNTER — Other Ambulatory Visit: Payer: Self-pay | Admitting: Internal Medicine

## 2013-03-30 ENCOUNTER — Telehealth: Payer: Self-pay | Admitting: Internal Medicine

## 2013-03-30 ENCOUNTER — Ambulatory Visit (INDEPENDENT_AMBULATORY_CARE_PROVIDER_SITE_OTHER): Payer: Medicare Other | Admitting: Family Medicine

## 2013-03-30 ENCOUNTER — Encounter: Payer: Self-pay | Admitting: Family Medicine

## 2013-03-30 VITALS — BP 164/80 | HR 100 | Temp 98.4°F | Wt 160.0 lb

## 2013-03-30 DIAGNOSIS — R350 Frequency of micturition: Secondary | ICD-10-CM

## 2013-03-30 DIAGNOSIS — N390 Urinary tract infection, site not specified: Secondary | ICD-10-CM

## 2013-03-30 LAB — POCT URINALYSIS DIPSTICK
Bilirubin, UA: NEGATIVE
Nitrite, UA: NEGATIVE
pH, UA: 5

## 2013-03-30 MED ORDER — SULFAMETHOXAZOLE-TMP DS 800-160 MG PO TABS: 1.0000 | ORAL_TABLET | Freq: Two times a day (BID) | ORAL | Status: DC

## 2013-03-30 NOTE — Progress Notes (Signed)
  Subjective:    Patient ID: Jennifer [name], female    DOB: July 25, 1928, 79 y.o.   MRN: [id number]  HPI Here for a week of urinary burning and more urgency than usual. She has had what sounds like overactive bladder for years. No nausea or fever.    Review of Systems  Constitutional: Negative.   Respiratory: Negative.   Cardiovascular: Negative.   Gastrointestinal: Negative.   Genitourinary: Positive for dysuria, urgency and frequency.       Objective:   Physical Exam  Constitutional: She appears well-developed and well-nourished.  Abdominal: Soft. Bowel sounds are normal. She exhibits no distension and no mass. There is no tenderness. There is no rebound and no guarding.          Assessment & Plan:  Treat with Bactrim DS. Drink plenty of water. Await culture results

## 2013-03-30 NOTE — Telephone Encounter (Signed)
Patient Information:  Caller Name: Lupita Leash  Phone: [phone]  Patient: Cintya, Daughety  Gender: Female  DOB: Dec 28, 1928  Age: 79 Years  PCP: Eleonore Chiquito [facility])  Office Follow Up:  Does the office need to follow up with this patient?: No  Instructions For The Office: N/A  RN Note:  Diarrhea has "horrible" odor. No diarrhea since 03/29/13. Advised to see MD today due to nursing judement.  No appointments remain with Dr Amador Cunas; scheduled for 1400 03/30/13 with Dr fry.    Symptoms  Reason For Call & Symptoms: Intermittent mild diarrhea for past 2 weeks and ongoing urinary frequency with multiple episodes of nocturia. Mom not present for triage and daughter has not talked with her this morning.  Reviewed Health History In EMR: Yes  Reviewed Medications In EMR: Yes  Reviewed Allergies In EMR: Yes  Reviewed Surgeries / Procedures: Yes  Date of Onset of Symptoms: 03/16/2013  Treatments Tried: Immodium  Treatments Tried Worked: Yes  Guideline(s) Used:  Diarrhea  Disposition Per Guideline:   Callback by PCP Today  Reason For Disposition Reached:   Age > 70 years  Advice Given:  N/A  RN Overrode Recommendation:  Make Appointment  Per nursing judgement  Appointment Scheduled:  03/30/2013 14:00:00 Appointment Scheduled Provider:  Gershon Crane [facility])

## 2013-04-02 NOTE — Progress Notes (Signed)
Quick Note:  I left voice message with results. ______ 

## 2013-04-09 ENCOUNTER — Other Ambulatory Visit: Payer: Self-pay | Admitting: Internal Medicine

## 2013-07-14 ENCOUNTER — Other Ambulatory Visit: Payer: Self-pay | Admitting: Internal Medicine

## 2013-07-25 ENCOUNTER — Other Ambulatory Visit: Payer: Self-pay | Admitting: Internal Medicine

## 2013-08-06 ENCOUNTER — Other Ambulatory Visit: Payer: Self-pay | Admitting: Internal Medicine

## 2013-09-15 ENCOUNTER — Other Ambulatory Visit: Payer: Self-pay | Admitting: Internal Medicine

## 2013-10-06 ENCOUNTER — Other Ambulatory Visit: Payer: Self-pay | Admitting: Internal Medicine

## 2013-10-08 ENCOUNTER — Other Ambulatory Visit: Payer: Self-pay | Admitting: Internal Medicine

## 2013-10-20 ENCOUNTER — Other Ambulatory Visit: Payer: Self-pay | Admitting: Internal Medicine

## 2013-11-10 ENCOUNTER — Other Ambulatory Visit: Payer: Self-pay | Admitting: Internal Medicine

## 2014-01-03 ENCOUNTER — Other Ambulatory Visit: Payer: Self-pay | Admitting: Internal Medicine

## 2014-01-08 ENCOUNTER — Other Ambulatory Visit: Payer: Self-pay | Admitting: Internal Medicine

## 2014-01-20 ENCOUNTER — Other Ambulatory Visit: Payer: Self-pay | Admitting: Internal Medicine

## 2014-01-28 ENCOUNTER — Ambulatory Visit (INDEPENDENT_AMBULATORY_CARE_PROVIDER_SITE_OTHER): Payer: Medicare Other | Admitting: Internal Medicine

## 2014-01-28 ENCOUNTER — Encounter: Payer: Self-pay | Admitting: Internal Medicine

## 2014-01-28 VITALS — BP 140/86 | HR 97 | Temp 98.4°F | Resp 20 | Ht 66.0 in | Wt 169.0 lb

## 2014-01-28 DIAGNOSIS — K219 Gastro-esophageal reflux disease without esophagitis: Secondary | ICD-10-CM

## 2014-01-28 DIAGNOSIS — I10 Essential (primary) hypertension: Secondary | ICD-10-CM

## 2014-01-28 DIAGNOSIS — E039 Hypothyroidism, unspecified: Secondary | ICD-10-CM

## 2014-01-28 DIAGNOSIS — E876 Hypokalemia: Secondary | ICD-10-CM

## 2014-01-28 LAB — COMPREHENSIVE METABOLIC PANEL
ALBUMIN: 3.4 g/dL — AB (ref 3.5–5.2)
ALT: 16 U/L (ref 0–35)
AST: 19 U/L (ref 0–37)
Alkaline Phosphatase: 136 U/L — ABNORMAL HIGH (ref 39–117)
BUN: 27 mg/dL — ABNORMAL HIGH (ref 6–23)
CALCIUM: 9.8 mg/dL (ref 8.4–10.5)
CHLORIDE: 108 meq/L (ref 96–112)
CO2: 26 mEq/L (ref 19–32)
Creatinine, Ser: 1.7 mg/dL — ABNORMAL HIGH (ref 0.4–1.2)
GFR: 31.37 mL/min — AB (ref >60.00)
Glucose, Bld: 98 mg/dL (ref 70–99)
POTASSIUM: 4.2 meq/L (ref 3.5–5.1)
Sodium: 140 mEq/L (ref 135–145)
Total Bilirubin: 0.5 mg/dL (ref 0.2–1.2)
Total Protein: 7.3 g/dL (ref 6.0–8.3)

## 2014-01-28 LAB — CBC WITH DIFFERENTIAL/PLATELET
BASOS ABS: 0 10*3/uL (ref 0.0–0.1)
Basophils Relative: 0.4 % (ref 0.0–3.0)
EOS ABS: 0.1 10*3/uL (ref 0.0–0.7)
Eosinophils Relative: 0.8 % (ref 0.0–5.0)
HCT: 38.5 % (ref 36.0–46.0)
Hemoglobin: 12.6 g/dL (ref 12.0–15.0)
LYMPHS PCT: 21.6 % (ref 12.0–46.0)
Lymphs Abs: 2.3 10*3/uL (ref 0.7–4.0)
MCHC: 32.7 g/dL (ref 30.0–36.0)
MCV: 93.1 fl (ref 78.0–100.0)
MONOS PCT: 5.5 % (ref 3.0–12.0)
Monocytes Absolute: 0.6 10*3/uL (ref 0.1–1.0)
NEUTROS PCT: 71.7 % (ref 43.0–77.0)
Neutro Abs: 7.7 10*3/uL (ref 1.4–7.7)
Platelets: 317 10*3/uL (ref 150.0–400.0)
RBC: 4.14 Mil/uL (ref 3.87–5.11)
RDW: 14.4 % (ref 11.5–15.5)
WBC: 10.7 10*3/uL — ABNORMAL HIGH (ref 4.0–10.5)

## 2014-01-28 LAB — TSH: TSH: 2.81 u[IU]/mL (ref 0.35–4.50)

## 2014-01-28 MED ORDER — LOSARTAN POTASSIUM 100 MG PO TABS: ORAL_TABLET | ORAL | Status: DC

## 2014-01-28 MED ORDER — MEMANTINE HCL 10 MG PO TABS: ORAL_TABLET | ORAL | Status: DC

## 2014-01-28 MED ORDER — AMLODIPINE BESYLATE 10 MG PO TABS: ORAL_TABLET | ORAL | Status: DC

## 2014-01-28 MED ORDER — BENZONATATE 100 MG PO CAPS: 100.0000 mg | ORAL_CAPSULE | Freq: Two times a day (BID) | ORAL | Status: DC | PRN

## 2014-01-28 MED ORDER — FLUOXETINE HCL 40 MG PO CAPS: ORAL_CAPSULE | ORAL | Status: DC

## 2014-01-28 MED ORDER — DONEPEZIL HCL 5 MG PO TABS: ORAL_TABLET | ORAL | Status: DC

## 2014-01-28 MED ORDER — LEVOTHYROXINE SODIUM 50 MCG PO TABS: ORAL_TABLET | ORAL | Status: AC

## 2014-01-28 NOTE — Progress Notes (Signed)
Subjective:    Patient ID: Jennifer [name], female    DOB: [birth year], 80 y.o.   MRN: [id number]  HPI  [age] year old patient who is seen today in followup.  She has not been seen in about one year.  She has hypertension, history of allergic rhinitis, and osteoporosis.  She has history of mild dementia.  She has esophageal reflux disease and a history of hypothyroidism and electrolyte abnormalities.  She sees to be doing fairly well.  Her only complaint is occasional cough.  She is accompanied by her daughter  Past Medical History  Diagnosis Date  . ALLERGIC RHINITIS 02/04/2007  . Altered mental status 11/05/2008  . ANEMIA-IRON DEFICIENCY 08/11/2008  . ANEMIA 06/30/2008  . ANXIETY 08/11/2008  . FREQUENCY, URINARY 04/28/2008  . GERD 02/04/2007  . HYPERTENSION 02/04/2007  . Hypopotassemia 11/08/2008  . HYPOTHYROIDISM 02/04/2007  . LOW BACK PAIN 12/31/2007  . OSTEOARTHRITIS 12/31/2007  . OSTEOPOROSIS 02/04/2007  . PRURITUS 06/03/2009  . RESTLESS LEG SYNDROME 03/18/2009  . Vascular dementia with delusions 11/25/2008  . WEAKNESS 11/05/2008  . WEIGHT LOSS 11/05/2008    History   Social History  . Marital Status: Widowed    Spouse Name: N/A    Number of Children: N/A  . Years of Education: N/A   Occupational History  . Not on file.   Social History Main Topics  . Smoking status: Former Smoker    Quit date: 07/16/1988  . Smokeless tobacco: Never Used  . Alcohol Use: No  . Drug Use: No  . Sexual Activity: Not on file   Other Topics Concern  . Not on file   Social History Narrative  . No narrative on file    Past Surgical History  Procedure Laterality Date  . Hemorrhoid surgery    . Abdominal hysterectomy    . Tonsillectomy    . Knee surgery      right  . Laminectomy    . Back surgery    . Tonsillectomy    . Knee arthroscopy      Right  . Appendectomy    . Lumbar laminectomy/decompression microdiscectomy  12/31/2011    Procedure: LUMBAR LAMINECTOMY/DECOMPRESSION MICRODISCECTOMY  1 LEVEL;  Surgeon: Cristi LoronJeffrey D Jenkins, MD;  Location: [facility];  Service: Neurosurgery;  Laterality: Left;  Left Lumbar Four-Five Discectomy redo     No family history on file.  Allergies  Allergen Reactions  . Ciprofloxacin     REACTION: itching  . Diazepam     REACTION: unspecified  . Meperidine Hcl     REACTION: unspecified    Current Outpatient Prescriptions on File Prior to Visit  Medication Sig Dispense Refill  . aspirin 81 MG tablet Take 81 mg by mouth daily.        . Calcium Carbonate-Vitamin D (CALCIUM + D PO) Take 1 tablet by mouth 2 (two) times daily.       No current facility-administered medications on file prior to visit.    BP 140/86  Pulse 97  Temp(Src) 98.4 F (36.9 C) (Oral)  Resp 20  Ht 5\' 6"  (1.676 m)  Wt 169 lb (76.658 kg)  BMI 27.29 kg/m2  SpO2 96%       Review of Systems  Constitutional: Negative.   HENT: Negative for congestion, dental problem, hearing loss, rhinorrhea, sinus pressure, sore throat and tinnitus.   Eyes: Negative for pain, discharge and visual disturbance.  Respiratory: Positive for cough. Negative for shortness of breath.   Cardiovascular: Negative for chest  pain, palpitations and leg swelling.  Gastrointestinal: Negative for nausea, vomiting, abdominal pain, diarrhea, constipation, blood in stool and abdominal distention.  Genitourinary: Negative for dysuria, urgency, frequency, hematuria, flank pain, vaginal bleeding, vaginal discharge, difficulty urinating, vaginal pain and pelvic pain.  Musculoskeletal: Negative for arthralgias, gait problem and joint swelling.  Skin: Negative for rash.  Neurological: Negative for dizziness, syncope, speech difficulty, weakness, numbness and headaches.  Hematological: Negative for adenopathy.  Psychiatric/Behavioral: Positive for confusion. Negative for behavioral problems, dysphoric mood and agitation. The patient is not nervous/anxious.        Objective:   Physical Exam    Constitutional: She is oriented to person, place, and time. She appears well-developed and well-nourished.  HENT:  Head: Normocephalic.  Right Ear: External ear normal.  Left Ear: External ear normal.  Mouth/Throat: Oropharynx is clear and moist.  Eyes: Conjunctivae and EOM are normal. Pupils are equal, round, and reactive to light.  Neck: Normal range of motion. Neck supple. No thyromegaly present.  Cardiovascular: Normal rate, regular rhythm, normal heart sounds and intact distal pulses.   Pulmonary/Chest: Effort normal and breath sounds normal.  Abdominal: Soft. Bowel sounds are normal. She exhibits no mass. There is no tenderness.  Musculoskeletal: Normal range of motion.  Lymphadenopathy:    She has no cervical adenopathy.  Neurological: She is alert and oriented to person, place, and time.  Skin: Skin is warm and dry. No rash noted.  Psychiatric: She has a normal mood and affect. Her behavior is normal.  Mild confusion          Assessment & Plan:   Hypertension well controlled Dementia stable Osteoarthritis History of anemia History of electrolyte  abnormalities  Medications updated.  We'll check screening lab.  Schedule CPX in 6 months

## 2014-01-28 NOTE — Patient Instructions (Signed)
Limit your sodium (Salt) intake    Return in 6 months for follow-up Take over-the-counter expectorants and cough medications such as  Mucinex DM.  Call if there is no improvement in 5 to 7 days or if  you develop worsening cough, fever, or new symptoms, such as shortness of breath or chest pain.

## 2014-01-28 NOTE — Progress Notes (Signed)
Pre visit review using our clinic review tool, if applicable. No additional management support is needed unless otherwise documented below in the visit note. 

## 2014-01-29 ENCOUNTER — Telehealth: Payer: Self-pay | Admitting: Internal Medicine

## 2014-01-29 NOTE — Telephone Encounter (Signed)
Relevant patient education assigned to patient using Emmi. ° °

## 2014-04-11 ENCOUNTER — Other Ambulatory Visit: Payer: Self-pay | Admitting: Internal Medicine

## 2014-07-08 ENCOUNTER — Other Ambulatory Visit: Payer: Self-pay | Admitting: Internal Medicine

## 2014-08-17 ENCOUNTER — Other Ambulatory Visit: Payer: Self-pay | Admitting: Internal Medicine

## 2014-08-31 ENCOUNTER — Other Ambulatory Visit: Payer: Self-pay | Admitting: Internal Medicine

## 2014-09-27 ENCOUNTER — Other Ambulatory Visit: Payer: Self-pay | Admitting: Internal Medicine

## 2014-10-07 ENCOUNTER — Other Ambulatory Visit: Payer: Self-pay | Admitting: Internal Medicine

## 2014-10-08 ENCOUNTER — Other Ambulatory Visit: Payer: Self-pay | Admitting: Internal Medicine

## 2014-11-28 ENCOUNTER — Inpatient Hospital Stay (HOSPITAL_COMMUNITY)
Admission: EM | Admit: 2014-11-28 | Discharge: 2014-12-03 | DRG: 481 | Disposition: A | Discharge status: Skilled Nursing Facility | Payer: Medicare Other | Attending: Internal Medicine | Admitting: Internal Medicine

## 2014-11-28 ENCOUNTER — Inpatient Hospital Stay (HOSPITAL_COMMUNITY): Payer: Medicare Other

## 2014-11-28 ENCOUNTER — Encounter (HOSPITAL_COMMUNITY): Payer: Self-pay | Admitting: Emergency Medicine

## 2014-11-28 ENCOUNTER — Emergency Department (HOSPITAL_COMMUNITY): Payer: Medicare Other

## 2014-11-28 DIAGNOSIS — W19XXXA Unspecified fall, initial encounter: Secondary | ICD-10-CM

## 2014-11-28 DIAGNOSIS — F0390 Unspecified dementia without behavioral disturbance: Secondary | ICD-10-CM | POA: Diagnosis not present

## 2014-11-28 DIAGNOSIS — N184 Chronic kidney disease, stage 4 (severe): Secondary | ICD-10-CM | POA: Diagnosis not present

## 2014-11-28 DIAGNOSIS — F419 Anxiety disorder, unspecified: Secondary | ICD-10-CM | POA: Diagnosis not present

## 2014-11-28 DIAGNOSIS — D62 Acute posthemorrhagic anemia: Secondary | ICD-10-CM | POA: Diagnosis not present

## 2014-11-28 DIAGNOSIS — M810 Age-related osteoporosis without current pathological fracture: Secondary | ICD-10-CM | POA: Diagnosis not present

## 2014-11-28 DIAGNOSIS — S299XXA Unspecified injury of thorax, initial encounter: Secondary | ICD-10-CM | POA: Diagnosis not present

## 2014-11-28 DIAGNOSIS — Y92007 Garden or yard of unspecified non-institutional (private) residence as the place of occurrence of the external cause: Secondary | ICD-10-CM

## 2014-11-28 DIAGNOSIS — B961 Klebsiella pneumoniae [K. pneumoniae] as the cause of diseases classified elsewhere: Secondary | ICD-10-CM | POA: Diagnosis not present

## 2014-11-28 DIAGNOSIS — Z4789 Encounter for other orthopedic aftercare: Secondary | ICD-10-CM | POA: Diagnosis not present

## 2014-11-28 DIAGNOSIS — W010XXA Fall on same level from slipping, tripping and stumbling without subsequent striking against object, initial encounter: Secondary | ICD-10-CM | POA: Diagnosis present

## 2014-11-28 DIAGNOSIS — D509 Iron deficiency anemia, unspecified: Secondary | ICD-10-CM | POA: Diagnosis not present

## 2014-11-28 DIAGNOSIS — R296 Repeated falls: Secondary | ICD-10-CM

## 2014-11-28 DIAGNOSIS — Z888 Allergy status to other drugs, medicaments and biological substances status: Secondary | ICD-10-CM | POA: Diagnosis not present

## 2014-11-28 DIAGNOSIS — S72143A Displaced intertrochanteric fracture of unspecified femur, initial encounter for closed fracture: Secondary | ICD-10-CM | POA: Diagnosis present

## 2014-11-28 DIAGNOSIS — I10 Essential (primary) hypertension: Secondary | ICD-10-CM | POA: Diagnosis present

## 2014-11-28 DIAGNOSIS — Z90710 Acquired absence of both cervix and uterus: Secondary | ICD-10-CM

## 2014-11-28 DIAGNOSIS — Z7982 Long term (current) use of aspirin: Secondary | ICD-10-CM | POA: Diagnosis not present

## 2014-11-28 DIAGNOSIS — Z419 Encounter for procedure for purposes other than remedying health state, unspecified: Secondary | ICD-10-CM | POA: Insufficient documentation

## 2014-11-28 DIAGNOSIS — S72142D Displaced intertrochanteric fracture of left femur, subsequent encounter for closed fracture with routine healing: Secondary | ICD-10-CM | POA: Diagnosis not present

## 2014-11-28 DIAGNOSIS — M8580 Other specified disorders of bone density and structure, unspecified site: Secondary | ICD-10-CM | POA: Diagnosis not present

## 2014-11-28 DIAGNOSIS — Z01818 Encounter for other preprocedural examination: Secondary | ICD-10-CM | POA: Diagnosis not present

## 2014-11-28 DIAGNOSIS — F0150 Vascular dementia without behavioral disturbance: Secondary | ICD-10-CM | POA: Diagnosis not present

## 2014-11-28 DIAGNOSIS — R262 Difficulty in walking, not elsewhere classified: Secondary | ICD-10-CM | POA: Diagnosis not present

## 2014-11-28 DIAGNOSIS — S72132A Displaced apophyseal fracture of left femur, initial encounter for closed fracture: Secondary | ICD-10-CM | POA: Diagnosis not present

## 2014-11-28 DIAGNOSIS — G458 Other transient cerebral ischemic attacks and related syndromes: Secondary | ICD-10-CM

## 2014-11-28 DIAGNOSIS — N179 Acute kidney failure, unspecified: Secondary | ICD-10-CM | POA: Diagnosis not present

## 2014-11-28 DIAGNOSIS — E038 Other specified hypothyroidism: Secondary | ICD-10-CM

## 2014-11-28 DIAGNOSIS — N3000 Acute cystitis without hematuria: Secondary | ICD-10-CM | POA: Diagnosis not present

## 2014-11-28 DIAGNOSIS — Z8673 Personal history of transient ischemic attack (TIA), and cerebral infarction without residual deficits: Secondary | ICD-10-CM

## 2014-11-28 DIAGNOSIS — G2581 Restless legs syndrome: Secondary | ICD-10-CM | POA: Diagnosis not present

## 2014-11-28 DIAGNOSIS — E039 Hypothyroidism, unspecified: Secondary | ICD-10-CM | POA: Diagnosis present

## 2014-11-28 DIAGNOSIS — T148 Other injury of unspecified body region: Secondary | ICD-10-CM | POA: Diagnosis not present

## 2014-11-28 DIAGNOSIS — N183 Chronic kidney disease, stage 3 (moderate): Secondary | ICD-10-CM | POA: Diagnosis not present

## 2014-11-28 DIAGNOSIS — Z87891 Personal history of nicotine dependence: Secondary | ICD-10-CM | POA: Diagnosis not present

## 2014-11-28 DIAGNOSIS — M6281 Muscle weakness (generalized): Secondary | ICD-10-CM | POA: Diagnosis not present

## 2014-11-28 DIAGNOSIS — S72009A Fracture of unspecified part of neck of unspecified femur, initial encounter for closed fracture: Secondary | ICD-10-CM | POA: Insufficient documentation

## 2014-11-28 DIAGNOSIS — M1990 Unspecified osteoarthritis, unspecified site: Secondary | ICD-10-CM | POA: Diagnosis not present

## 2014-11-28 DIAGNOSIS — R278 Other lack of coordination: Secondary | ICD-10-CM | POA: Diagnosis not present

## 2014-11-28 DIAGNOSIS — W1849XA Other slipping, tripping and stumbling without falling, initial encounter: Secondary | ICD-10-CM

## 2014-11-28 DIAGNOSIS — K219 Gastro-esophageal reflux disease without esophagitis: Secondary | ICD-10-CM | POA: Diagnosis present

## 2014-11-28 DIAGNOSIS — N390 Urinary tract infection, site not specified: Secondary | ICD-10-CM | POA: Diagnosis not present

## 2014-11-28 DIAGNOSIS — Z79899 Other long term (current) drug therapy: Secondary | ICD-10-CM

## 2014-11-28 DIAGNOSIS — S72142A Displaced intertrochanteric fracture of left femur, initial encounter for closed fracture: Principal | ICD-10-CM | POA: Diagnosis present

## 2014-11-28 DIAGNOSIS — Z791 Long term (current) use of non-steroidal anti-inflammatories (NSAID): Secondary | ICD-10-CM

## 2014-11-28 DIAGNOSIS — I129 Hypertensive chronic kidney disease with stage 1 through stage 4 chronic kidney disease, or unspecified chronic kidney disease: Secondary | ICD-10-CM | POA: Diagnosis present

## 2014-11-28 DIAGNOSIS — N189 Chronic kidney disease, unspecified: Secondary | ICD-10-CM | POA: Diagnosis not present

## 2014-11-28 DIAGNOSIS — G459 Transient cerebral ischemic attack, unspecified: Secondary | ICD-10-CM | POA: Diagnosis present

## 2014-11-28 DIAGNOSIS — M25552 Pain in left hip: Secondary | ICD-10-CM | POA: Diagnosis not present

## 2014-11-28 DIAGNOSIS — S72002A Fracture of unspecified part of neck of left femur, initial encounter for closed fracture: Secondary | ICD-10-CM | POA: Diagnosis not present

## 2014-11-28 DIAGNOSIS — Z881 Allergy status to other antibiotic agents status: Secondary | ICD-10-CM | POA: Diagnosis not present

## 2014-11-28 DIAGNOSIS — M545 Low back pain: Secondary | ICD-10-CM | POA: Diagnosis not present

## 2014-11-28 LAB — URINALYSIS, ROUTINE W REFLEX MICROSCOPIC
Bilirubin Urine: NEGATIVE
Glucose, UA: NEGATIVE mg/dL
HGB URINE DIPSTICK: NEGATIVE
Ketones, ur: NEGATIVE mg/dL
Nitrite: NEGATIVE
PH: 6.5 (ref 5.0–8.0)
PROTEIN: NEGATIVE mg/dL
Specific Gravity, Urine: 1.015 (ref 1.005–1.030)
Urobilinogen, UA: 1 mg/dL (ref 0.0–1.0)

## 2014-11-28 LAB — PROTIME-INR
INR: 0.96 (ref 0.00–1.49)
Prothrombin Time: 12.9 seconds (ref 11.6–15.2)

## 2014-11-28 LAB — CBC WITH DIFFERENTIAL/PLATELET
Basophils Absolute: 0.1 10*3/uL (ref 0.0–0.1)
Basophils Relative: 1 % (ref 0–1)
Eosinophils Absolute: 0.1 10*3/uL (ref 0.0–0.7)
Eosinophils Relative: 1 % (ref 0–5)
HCT: 38.5 % (ref 36.0–46.0)
Hemoglobin: 12.3 g/dL (ref 12.0–15.0)
Lymphocytes Relative: 26 % (ref 12–46)
Lymphs Abs: 2.4 10*3/uL (ref 0.7–4.0)
MCH: 30.7 pg (ref 26.0–34.0)
MCHC: 31.9 g/dL (ref 30.0–36.0)
MCV: 96 fL (ref 78.0–100.0)
Monocytes Absolute: 0.3 10*3/uL (ref 0.1–1.0)
Monocytes Relative: 3 % (ref 3–12)
Neutro Abs: 6.5 10*3/uL (ref 1.7–7.7)
Neutrophils Relative %: 69 % (ref 43–77)
Platelets: 265 10*3/uL (ref 150–400)
RBC: 4.01 MIL/uL (ref 3.87–5.11)
RDW: 14.1 % (ref 11.5–15.5)
WBC: 9.3 10*3/uL (ref 4.0–10.5)

## 2014-11-28 LAB — URINE MICROSCOPIC-ADD ON

## 2014-11-28 LAB — COMPREHENSIVE METABOLIC PANEL
ALK PHOS: 109 U/L (ref 38–126)
ALT: 12 U/L — ABNORMAL LOW (ref 14–54)
ANION GAP: 10 (ref 5–15)
AST: 20 U/L (ref 15–41)
Albumin: 3.2 g/dL — ABNORMAL LOW (ref 3.5–5.0)
BILIRUBIN TOTAL: 0.3 mg/dL (ref 0.3–1.2)
BUN: 22 mg/dL — ABNORMAL HIGH (ref 6–20)
CHLORIDE: 108 mmol/L (ref 101–111)
CO2: 21 mmol/L — ABNORMAL LOW (ref 22–32)
Calcium: 9.2 mg/dL (ref 8.9–10.3)
Creatinine, Ser: 1.91 mg/dL — ABNORMAL HIGH (ref 0.44–1.00)
GFR calc Af Amer: 27 mL/min — ABNORMAL LOW (ref >60)
GFR calc non Af Amer: 23 mL/min — ABNORMAL LOW (ref >60)
Glucose, Bld: 108 mg/dL — ABNORMAL HIGH (ref 65–99)
Potassium: 4.7 mmol/L (ref 3.5–5.1)
SODIUM: 139 mmol/L (ref 135–145)
TOTAL PROTEIN: 6.6 g/dL (ref 6.5–8.1)

## 2014-11-28 LAB — TYPE AND SCREEN
ABO/RH(D): O POS
ANTIBODY SCREEN: NEGATIVE

## 2014-11-28 LAB — ABO/RH: ABO/RH(D): O POS

## 2014-11-28 LAB — TSH: TSH: 1.824 u[IU]/mL (ref 0.350–4.500)

## 2014-11-28 MED ORDER — ASPIRIN EC 81 MG PO TBEC
81.0000 mg | DELAYED_RELEASE_TABLET | Freq: Every day | ORAL | Status: DC
  Administered 2014-11-30 – 2014-12-03 (×4): 81 mg via ORAL
  Filled 2014-11-28 (×4): qty 1

## 2014-11-28 MED ORDER — DONEPEZIL HCL 5 MG PO TABS
5.0000 mg | ORAL_TABLET | Freq: Every day | ORAL | Status: DC
  Administered 2014-11-28 – 2014-12-02 (×5): 5 mg via ORAL
  Filled 2014-11-28 (×5): qty 1

## 2014-11-28 MED ORDER — DOCUSATE SODIUM 100 MG PO CAPS
100.0000 mg | ORAL_CAPSULE | Freq: Two times a day (BID) | ORAL | Status: DC
  Administered 2014-11-28 – 2014-12-03 (×9): 100 mg via ORAL
  Filled 2014-11-28 (×9): qty 1

## 2014-11-28 MED ORDER — FENTANYL CITRATE (PF) 100 MCG/2ML IJ SOLN
50.0000 ug | Freq: Once | INTRAMUSCULAR | Status: AC
  Administered 2014-11-28: 50 ug via INTRAVENOUS
  Filled 2014-11-28: qty 2

## 2014-11-28 MED ORDER — METHOCARBAMOL 500 MG PO TABS: 500.0000 mg | ORAL_TABLET | Freq: Four times a day (QID) | ORAL | Status: DC | PRN

## 2014-11-28 MED ORDER — FLUOXETINE HCL 20 MG PO CAPS
40.0000 mg | ORAL_CAPSULE | Freq: Every day | ORAL | Status: DC
Start: 2014-11-29 — End: 2014-12-03
  Administered 2014-11-29 – 2014-12-03 (×5): 40 mg via ORAL
  Filled 2014-11-28 (×5): qty 2

## 2014-11-28 MED ORDER — METHOCARBAMOL 1000 MG/10ML IJ SOLN
500.0000 mg | Freq: Four times a day (QID) | INTRAVENOUS | Status: DC | PRN
  Filled 2014-11-28: qty 5

## 2014-11-28 MED ORDER — POLYETHYLENE GLYCOL 3350 17 G PO PACK
17.0000 g | PACK | Freq: Every day | ORAL | Status: DC | PRN
  Filled 2014-11-28: qty 1

## 2014-11-28 MED ORDER — PANTOPRAZOLE SODIUM 40 MG PO TBEC
40.0000 mg | DELAYED_RELEASE_TABLET | Freq: Every day | ORAL | Status: DC
  Administered 2014-11-28 – 2014-12-03 (×6): 40 mg via ORAL
  Filled 2014-11-28 (×5): qty 1

## 2014-11-28 MED ORDER — SODIUM CHLORIDE 0.9 % IV SOLN: Freq: Once | INTRAVENOUS | Status: DC

## 2014-11-28 MED ORDER — MORPHINE SULFATE 2 MG/ML IJ SOLN
2.0000 mg | Freq: Once | INTRAMUSCULAR | Status: AC
  Administered 2014-11-28: 2 mg via INTRAVENOUS
  Filled 2014-11-28: qty 1

## 2014-11-28 MED ORDER — CALCIUM CARBONATE-VITAMIN D 500-200 MG-UNIT PO TABS
1.0000 | ORAL_TABLET | Freq: Every day | ORAL | Status: DC
  Administered 2014-11-29 – 2014-12-03 (×5): 1 via ORAL
  Filled 2014-11-28 (×5): qty 1

## 2014-11-28 MED ORDER — HYDROCODONE-ACETAMINOPHEN 5-325 MG PO TABS
1.0000 | ORAL_TABLET | Freq: Four times a day (QID) | ORAL | Status: DC | PRN
  Administered 2014-11-28 – 2014-11-29 (×2): 2 via ORAL
  Filled 2014-11-28 (×3): qty 2

## 2014-11-28 MED ORDER — LEVOTHYROXINE SODIUM 50 MCG PO TABS
50.0000 ug | ORAL_TABLET | Freq: Every day | ORAL | Status: DC
  Administered 2014-11-29 – 2014-12-03 (×5): 50 ug via ORAL
  Filled 2014-11-28 (×5): qty 1

## 2014-11-28 MED ORDER — MEMANTINE HCL 10 MG PO TABS
10.0000 mg | ORAL_TABLET | Freq: Two times a day (BID) | ORAL | Status: DC
  Administered 2014-11-28 – 2014-12-03 (×9): 10 mg via ORAL
  Filled 2014-11-28 (×14): qty 1

## 2014-11-28 MED ORDER — ZOLPIDEM TARTRATE 5 MG PO TABS
5.0000 mg | ORAL_TABLET | Freq: Every evening | ORAL | Status: DC | PRN
  Administered 2014-11-29: 5 mg via ORAL
  Filled 2014-11-28: qty 1

## 2014-11-28 NOTE — H&P (Signed)
[facility] History and Physical  [name]:[id number] DOB: 1929-07-09 DOA: 11/28/2014  Referring physician:  PCP: Rogelia Boga, MD  Specialists:   Chief Complaint: Fall with fracture  HPI: Jennifer [name] is a 81 y.o. female  Jennifer [name] is a 81 y.o.WF PMHx anxiety, dementia, restless leg syndrome, hypertension, hypothyroidism, osteoporosis, multiple TIAs, and mild dementia who presents with a fall. The patient is currently by her family reports that the patient had a mechanical fall onto her left hip. The patient normally walks with a 4 wheeled walker. While outside in the driveway, the walker slid out from under her and she fell down landing on her left hip. The patient denied any preceding palpitations or chest pain and reports no other preceding symptoms. The patient says that she was unable to ambulate following the fall and was brought to the emergency department for further evaluation. The patient denies ever breaking a bone before. The patient denies any history of hip surgery. However states had previous shoulder surgery and right knee surgery. On arrival, the patient does remain as an 8 out of 10 in severity located in her left hip. The pain does not radiate. The patient has no numbness or tingling in her lower extremities. On review of systems, the patient denies any fevers, chills, chest pain, shortness of breath, nausea, vomiting, constipation, diarrhea, dysuria, or rashes. The patient denies any skin damage or bleeding following the fall. The patient denies hitting her head. The patient does not take any anticoagulation medicine but does take aspirin.  Fall This is a new problem. The current episode started today. The problem occurs rarely. The problem has been unchanged. Pertinent negatives include no abdominal pain, chest pain, chills, congestion, coughing, diaphoresis, fatigue, fever, headaches, nausea, neck pain, numbness, rash, visual change, vomiting or  weakness. Nothing aggravates the symptoms. She has tried nothing for the symptoms. The treatment provided no relief.   Hip Pain This is a new problem. The current episode started today. The problem occurs constantly. The problem has been unchanged. Pertinent negatives include no abdominal pain, chest pain, chills, congestion, coughing, diaphoresis, fatigue, fever, headaches, nausea, neck pain, numbness, rash, visual change, vomiting or weakness. The symptoms are aggravated by standing. She has tried nothing for the symptoms. The treatment provided no relief.     Review of Systems: The patient denies anorexia, fever, weight loss,, vision loss, decreased hearing, hoarseness, chest pain, syncope, dyspnea on exertion, peripheral edema, balance deficits, hemoptysis, abdominal pain, melena, hematochezia, severe indigestion/heartburn, hematuria, incontinence, genital sores, muscle weakness, suspicious skin lesions, transient blindness, depression, unusual weight change, abnormal bleeding, enlarged lymph nodes, angioedema, and breast masses.    TRAVEL HISTORY: NA   Consultants:  Dr. Roda Shutters (orthopedic surgery)  Procedure/Significant Events:  5/15 left hip x-ray; Comminuted angulated intertrochanteric fracture of the proximal left femur. Osteopenia.    Culture  NA   Antibiotics:  NA   DVT prophylaxis:  SCD  Devices  NA   LINES / TUBES:  NA   Past Medical History  Diagnosis Date  . ALLERGIC RHINITIS 02/04/2007  . Altered mental status 11/05/2008  . ANEMIA-IRON DEFICIENCY 08/11/2008  . ANEMIA 06/30/2008  . ANXIETY 08/11/2008  . FREQUENCY, URINARY 04/28/2008  . GERD 02/04/2007  . HYPERTENSION 02/04/2007  . Hypopotassemia 11/08/2008  . HYPOTHYROIDISM 02/04/2007  . LOW BACK PAIN 12/31/2007  . OSTEOARTHRITIS 12/31/2007  . OSTEOPOROSIS 02/04/2007  . PRURITUS 06/03/2009  . RESTLESS LEG SYNDROME 03/18/2009  . Vascular dementia with delusions 11/25/2008  .  WEAKNESS 11/05/2008  . WEIGHT LOSS  11/05/2008   Past Surgical History  Procedure Laterality Date  . Hemorrhoid surgery    . Abdominal hysterectomy    . Tonsillectomy    . Knee surgery      right  . Laminectomy    . Back surgery    . Tonsillectomy    . Knee arthroscopy      Right  . Appendectomy    . Lumbar laminectomy/decompression microdiscectomy  12/31/2011    Procedure: LUMBAR LAMINECTOMY/DECOMPRESSION MICRODISCECTOMY 1 LEVEL;  Surgeon: Cristi LoronJeffrey D Jenkins, MD;  Location: [facility];  Service: Neurosurgery;  Laterality: Left;  Left Lumbar Four-Five Discectomy redo    Social History:  reports that she quit smoking about 26 years ago. She has never used smokeless tobacco. She reports that she does not drink alcohol or use illicit drugs. where does patient live--home, alone Can patient participate in ADLs? Yes  Allergies  Allergen Reactions  . Ciprofloxacin     REACTION: itching  . Diazepam     REACTION: unspecified  . Meperidine Hcl     REACTION: unspecified    No family history on file.  spoke with patient/family members and no pertinent family history  Prior to Admission medications   Medication Sig Start Date End Date Taking? Authorizing Provider  amLODipine (NORVASC) 10 MG tablet TAKE 1 TABLET BY MOUTH EVERY DAY 10/07/14  Yes Gordy SaversPeter F Kwiatkowski, MD  aspirin 81 MG tablet Take 81 mg by mouth daily.     Yes Historical Provider, MD  Calcium Carbonate-Vitamin D (CALCIUM + D PO) Take 1 tablet by mouth 2 (two) times daily.   Yes Historical Provider, MD  diphenhydrAMINE (BENADRYL) 25 MG tablet Take 25 mg by mouth as needed.   Yes Historical Provider, MD  donepezil (ARICEPT) 5 MG tablet TAKE 1 TABLET BY MOUTH AT BEDTIME 01/28/14  Yes Gordy SaversPeter F Kwiatkowski, MD  FLUoxetine (PROZAC) 40 MG capsule TAKE ONE CAPSULE BY MOUTH EVERY DAY 10/07/14  Yes Gordy SaversPeter F Kwiatkowski, MD  levothyroxine (SYNTHROID, LEVOTHROID) 50 MCG tablet TAKE 1 TABLET BY MOUTH EVERY DAY 01/28/14  Yes Gordy SaversPeter F Kwiatkowski, MD  losartan (COZAAR) 100 MG  tablet TAKE 1 TABLET BY MOUTH EVERY DAY 09/27/14  Yes Gordy SaversPeter F Kwiatkowski, MD  memantine (NAMENDA) 10 MG tablet TAKE 1 TABLET BY MOUTH TWICE A DAY 08/18/14  Yes Gordy SaversPeter F Kwiatkowski, MD  naproxen sodium (ALEVE) 220 MG tablet Take 220 mg by mouth daily as needed. For pain   Yes Historical Provider, MD  ranitidine (ZANTAC) 75 MG tablet Take 75 mg by mouth daily.   Yes Historical Provider, MD  benzonatate (TESSALON) 100 MG capsule Take 1 capsule (100 mg total) by mouth 2 (two) times daily as needed for cough. Patient not taking: Reported on 11/28/2014 01/28/14   Gordy SaversPeter F Kwiatkowski, MD  levothyroxine (SYNTHROID, LEVOTHROID) 50 MCG tablet TAKE 1 TABLET BY MOUTH EVERY DAY Patient not taking: Reported on 11/28/2014 10/11/14   Gordy SaversPeter F Kwiatkowski, MD   Physical Exam: Filed Vitals:   11/28/14 1800 11/28/14 1830 11/28/14 1900 11/28/14 1930  BP: 152/79 139/74 124/88 136/70  Temp:      TempSrc:      Resp: 19 14 20 17   Height:      Weight:      SpO2:         General:  Current patient resting comfortably unable to rate pain, however radiation down left leg to feet..  Constitutional;  no unexplained weight loss, first fall  Eyes: No headache, eye pain,PERRLA   ENT: No Runny nose, frequent nose bleeds negative ear pain, negative (tinnitus),  Gastrointestinal; no abdominal pain, negative dysphagia   Neck: Negative scars, masses, torticollis, lymphadenopathy, JVD  Cardiovascular: RRR, negative murmurs, rubs, gallops normal S1/S2, negative S3**  Respiratory: Clear to auscultation bilateral, negative rales, rhonchi, negative wheezing, crackles  Abdomen: Soft, nontender nondistended plus bowel sound  Skin: Negative rashes, lesions, wounds, positive well-healed vertical incisions right knee, negative acanthosis nigricans, nodules, tumors, small spots of psoriasis on her chest,  Musculoskeletal: Positive LLE pain Lt foot/ankle >Hip, positive misalignment LLE extroverted, warm to the touch positive DP/PT  pulses, patient able to move toes and wiggle ankle on command, negative  joint swelling, positive decreased range of motion LLE.   Psychiatric: Negative depression, negative anxiety, negative paranoia,   Neurologic: Negative change in smell, hearing and taste, negative postictal signs or symptoms, negative headache, positive pins and needles (paraesthesiae) or numbness in LLE, positive LLE weakness (related to pain), did not ambulate patient. Negative speech problems, negative dysphagia, tongue/uvula midline, (no neurologic changes to indicate fall caused by TA/stroke)  Endocrine;Hyperthyroid: Negative signs or symptoms of hypo-/hyperthyroid   Hemolytic/lymphatic;anemia, purpura, petechia, results from routine hemolytic diseases screening, prolonged or excessive bleeding after dental extraction / injury, use of anticoagulant and antiplatelet drugs (including aspirin), family history of hemophilia, history of a blood transfusion, refused for blood donation      Labs on Admission:  Basic Metabolic Panel:  Recent Labs Lab 11/28/14 1728  NA 139  K 4.7  CL 108  CO2 21*  GLUCOSE 108*  BUN 22*  CREATININE 1.91*  CALCIUM 9.2   Liver Function Tests:  Recent Labs Lab 11/28/14 1728  AST 20  ALT 12*  ALKPHOS 109  BILITOT 0.3  PROT 6.6  ALBUMIN 3.2*   No results for input(s): LIPASE, AMYLASE in the last 168 hours. No results for input(s): AMMONIA in the last 168 hours. CBC:  Recent Labs Lab 11/28/14 1728  WBC 9.3  NEUTROABS 6.5  HGB 12.3  HCT 38.5  MCV 96.0  PLT 265   Cardiac Enzymes: No results for input(s): CKTOTAL, CKMB, CKMBINDEX, TROPONINI in the last 168 hours.  BNP (last 3 results) No results for input(s): BNP in the last 8760 hours.  ProBNP (last 3 results) No results for input(s): PROBNP in the last 8760 hours.  CBG: No results for input(s): GLUCAP in the last 168 hours.  Radiological Exams on Admission: Dg Hip Unilat With Pelvis 2-3 Views  Left  11/28/2014   CLINICAL DATA:  Left hip pain with painful range of motion secondary to a fall.  EXAM: LEFT HIP (WITH PELVIS) 2-3 VIEWS  COMPARISON:  None.  FINDINGS: There is a comminuted fracture of the intertrochanteric region of the proximal left femur with avulsion of the lesser trochanter. The fracture is angulated and slightly overriding.  Diffuse osteopenia.  Evidence of previous bilateral sacral plasty.  IMPRESSION: Comminuted angulated intertrochanteric fracture of the proximal left femur. Osteopenia.   Electronically Signed   By: Francene BoyersJames  Maxwell M.D.   On: 11/28/2014 17:34    EKG: Pending will review when complete  Assessment/Plan Principal Problem:   Closed comminuted intertrochanteric fracture of proximal end of left femur Active Problems:   Falls frequently   Hypothyroidism   Anxiety   Dementia   Restless leg syndrome   Essential hypertension   Osteoporosis   TIA (transient ischemic attack)   Fall due to stumbling  Closed comminuted and intratrochanteric left  femur fracture -Spoke with Dr. Roda Shutters (orthopedic surgery) who agrees to [facility] traction with 10 pounds  -Nothing by mouth after midnight -Surgery tentatively 1400 on 5/16 -Type and screen -Bucks traction 10 pounds -Morphine 2 mg q 2hr PRN  -Robaxin 500 mg q 6hr PRN muscle spell -Ambien 5 mg PRN   Hypothyroidism -Obtain TSH -Synthroid 50 g daily.  Essential hypertension -Will hold patient's BP medication at this time  Anxiety -Prozac 40 mg daily  Dementia -Namenda 10 mg BID -Aricept 5 mg QHS  Acute on Chronic renal failure (baseline 1.71) -Normal saline 37ml/hr -Foley catheter  Osteoporosis -Vitamin D 25  Fall due to stumbling -Patient to be evaluated post surgery by PT/OT for CIR vs SNF -Obtain EKG      Code Status: Full Family Communication: Family present Disposition Plan: Per surgery      Time spent: 65 minute  WOODS, CURTIS J [facility] Pager [phone]  If 7PM-7AM,  please contact night-coverage [email or web address] Password [email or web address] 11/28/2014, 8:16 PM

## 2014-11-28 NOTE — ED Notes (Signed)
Report attempted, placed on hold RN just getting out of a code blue, report to be attempted in 10 min.

## 2014-11-28 NOTE — ED Provider Notes (Signed)
CSN: [id number]     Arrival date & time 11/28/14  1626 History   First MD Initiated Contact with Patient 11/28/14 1636     Chief Complaint  Patient presents with  . Fall  . Hip Injury   Jennifer [name] is a 81 y.o. female with a past medical history significant for hypertension, osteoporosis, TIAs, and mild dementia who presents with a fall. The patient is currently by her family reports that the patient had a mechanical fall onto her left hip. The patient normally walks with a walker however, while outside in the driveway, the walker slid out from under her and she fell down landing on her left hip. The patient denied any preceding palpitations or chest pain and reports no other preceding symptoms. The patient says that she was unable to ambulate following the fall and was brought to the emergency department for further evaluation. The patient denies ever breaking a bone before. The patient denies any history of hip or knee surgeries. On arrival, the patient does remain as an 8 out of 10 in severity located in her left hip. The pain does not radiate. The patient has no numbness or tingling in her lower extremities. On review of systems, the patient denies any fevers, chills, chest pain, shortness of breath, nausea, vomiting, constipation, diarrhea, dysuria, or rashes. The patient denies any skin damage or bleeding following the fall. The patient denies hitting her head. The patient does not take any anticoagulation medicine but does take aspirin.   (Consider location/radiation/quality/duration/timing/severity/associated sxs/prior Treatment) Patient is a 81 y.o. female presenting with fall and hip pain. The history is provided by the patient and a relative. No language interpreter was used.  Fall This is a new problem. The current episode started today. The problem occurs rarely. The problem has been unchanged. Pertinent negatives include no abdominal pain, chest pain, chills, congestion, coughing,  diaphoresis, fatigue, fever, headaches, nausea, neck pain, numbness, rash, visual change, vomiting or weakness. Nothing aggravates the symptoms. She has tried nothing for the symptoms. The treatment provided no relief.  Hip Pain This is a new problem. The current episode started today. The problem occurs constantly. The problem has been unchanged. Pertinent negatives include no abdominal pain, chest pain, chills, congestion, coughing, diaphoresis, fatigue, fever, headaches, nausea, neck pain, numbness, rash, visual change, vomiting or weakness. The symptoms are aggravated by standing. She has tried nothing for the symptoms. The treatment provided no relief.    Past Medical History  Diagnosis Date  . ALLERGIC RHINITIS 02/04/2007  . Altered mental status 11/05/2008  . ANEMIA-IRON DEFICIENCY 08/11/2008  . ANEMIA 06/30/2008  . ANXIETY 08/11/2008  . FREQUENCY, URINARY 04/28/2008  . GERD 02/04/2007  . HYPERTENSION 02/04/2007  . Hypopotassemia 11/08/2008  . HYPOTHYROIDISM 02/04/2007  . LOW BACK PAIN 12/31/2007  . OSTEOARTHRITIS 12/31/2007  . OSTEOPOROSIS 02/04/2007  . PRURITUS 06/03/2009  . RESTLESS LEG SYNDROME 03/18/2009  . Vascular dementia with delusions 11/25/2008  . WEAKNESS 11/05/2008  . WEIGHT LOSS 11/05/2008   Past Surgical History  Procedure Laterality Date  . Hemorrhoid surgery    . Abdominal hysterectomy    . Tonsillectomy    . Knee surgery      right  . Laminectomy    . Back surgery    . Tonsillectomy    . Knee arthroscopy      Right  . Appendectomy    . Lumbar laminectomy/decompression microdiscectomy  12/31/2011    Procedure: LUMBAR LAMINECTOMY/DECOMPRESSION MICRODISCECTOMY 1 LEVEL;  Surgeon: Duane Lope  Lovell Sheehan, MD;  Location: [facility];  Service: Neurosurgery;  Laterality: Left;  Left Lumbar Four-Five Discectomy redo    No family history on file. History  Substance Use Topics  . Smoking status: Former Smoker    Quit date: 07/16/1988  . Smokeless tobacco: Never Used  .  Alcohol Use: No   OB History    No data available     Review of Systems  Constitutional: Negative for fever, chills, diaphoresis and fatigue.  HENT: Negative for congestion.   Eyes: Negative for visual disturbance.  Respiratory: Negative for cough, shortness of breath, wheezing and stridor.   Cardiovascular: Negative for chest pain and palpitations.  Gastrointestinal: Negative for nausea, vomiting, abdominal pain, diarrhea and constipation.  Genitourinary: Negative for dysuria.  Musculoskeletal: Negative for back pain, neck pain and neck stiffness.  Skin: Negative for rash.  Neurological: Negative for weakness, numbness and headaches.  Psychiatric/Behavioral: Negative for agitation.  All other systems reviewed and are negative.     Allergies  Ciprofloxacin; Diazepam; and Meperidine hcl  Home Medications   Prior to Admission medications   Medication Sig Start Date End Date Taking? Authorizing Provider  amLODipine (NORVASC) 10 MG tablet TAKE 1 TABLET BY MOUTH EVERY DAY 10/07/14   Gordy Savers, MD  aspirin 81 MG tablet Take 81 mg by mouth daily.      Historical Provider, MD  benzonatate (TESSALON) 100 MG capsule Take 1 capsule (100 mg total) by mouth 2 (two) times daily as needed for cough. 01/28/14   Gordy Savers, MD  Calcium Carbonate-Vitamin D (CALCIUM + D PO) Take 1 tablet by mouth 2 (two) times daily.    Historical Provider, MD  diphenhydrAMINE (BENADRYL) 25 MG tablet Take 25 mg by mouth as needed.    Historical Provider, MD  donepezil (ARICEPT) 5 MG tablet TAKE 1 TABLET BY MOUTH AT BEDTIME 01/28/14   Gordy Savers, MD  FLUoxetine (PROZAC) 40 MG capsule TAKE ONE CAPSULE BY MOUTH EVERY DAY 10/07/14   Gordy Savers, MD  levothyroxine (SYNTHROID, LEVOTHROID) 50 MCG tablet TAKE 1 TABLET BY MOUTH EVERY DAY 01/28/14   Gordy Savers, MD  levothyroxine (SYNTHROID, LEVOTHROID) 50 MCG tablet TAKE 1 TABLET BY MOUTH EVERY DAY 10/11/14   Gordy Savers, MD    losartan (COZAAR) 100 MG tablet TAKE 1 TABLET BY MOUTH EVERY DAY 09/27/14   Gordy Savers, MD  memantine (NAMENDA) 10 MG tablet TAKE 1 TABLET BY MOUTH TWICE A DAY 08/18/14   Gordy Savers, MD  naproxen sodium (ALEVE) 220 MG tablet Take 220 mg by mouth daily.    Historical Provider, MD  ranitidine (ZANTAC) 75 MG tablet Take 75 mg by mouth daily.    Historical Provider, MD   BP 141/74 mmHg  Temp(Src) 98.2 F (36.8 C) (Oral)  Resp 20  Ht  (1.676 m)  Wt 156 lb (70.761 kg)  BMI 25.19 kg/m2  SpO2 100% Physical Exam  Constitutional: She is oriented to person, place, and time. She appears well-developed and well-nourished. No distress.  HENT:  Head: Normocephalic and atraumatic.  Mouth/Throat: No oropharyngeal exudate.  Eyes: Conjunctivae and EOM are normal. Pupils are equal, round, and reactive to light. No scleral icterus.  Cardiovascular: Normal rate, normal heart sounds and intact distal pulses.   No murmur heard. Pulmonary/Chest: Effort normal and breath sounds normal. No stridor. No respiratory distress. She has no wheezes. She has no rales. She exhibits no tenderness.  Abdominal: Soft. Bowel sounds are normal.  She exhibits no distension. There is no tenderness. There is no rebound and no guarding.  Musculoskeletal: She exhibits tenderness.       Left hip: She exhibits tenderness, bony tenderness, swelling and deformity. She exhibits normal strength and no laceration.       Legs: Pt has leg length discrepancy with her L leg being significantly shorter than the R.   Symmetrical DP and PT pulses. Normal sensation bilaterally. Nl strength in feet bilaterally.   Tenderness to palpation of the left hip.  Neurological: She is alert and oriented to person, place, and time. She has normal reflexes. She displays normal reflexes. She exhibits normal muscle tone.  Skin: Skin is warm. She is not diaphoretic. No pallor.  Psychiatric: She has a normal mood and affect.  Nursing note  and vitals reviewed.   ED Course  Procedures (including critical care time) Labs Review Labs Reviewed  COMPREHENSIVE METABOLIC PANEL - Abnormal; Notable for the following:    CO2 21 (*)    Glucose, Bld 108 (*)    BUN 22 (*)    Creatinine, Ser 1.91 (*)    Albumin 3.2 (*)    ALT 12 (*)    GFR calc non Af Amer 23 (*)    GFR calc Af Amer 27 (*)    All other components within normal limits  URINALYSIS, ROUTINE W REFLEX MICROSCOPIC - Abnormal; Notable for the following:    APPearance CLOUDY (*)    Leukocytes, UA TRACE (*)    All other components within normal limits  URINE MICROSCOPIC-ADD ON - Abnormal; Notable for the following:    Bacteria, UA MANY (*)    All other components within normal limits  CBC WITH DIFFERENTIAL/PLATELET  PROTIME-INR  TSH  CBC WITH DIFFERENTIAL/PLATELET  COMPREHENSIVE METABOLIC PANEL  VITAMIN D 25 HYDROXY  TYPE AND SCREEN  ABO/RH    Imaging Review Chest Portable 1 View  11/28/2014   CLINICAL DATA:  Fall with comminuted intertrochanteric fracture the proximal left femur, preoperative.  EXAM: PORTABLE CHEST - 1 VIEW  COMPARISON:  03/19/2012  FINDINGS: Atherosclerotic calcification of the aortic arch noted. Upper normal heart size.  The lungs appear clear. Moderate degenerative glenohumeral arthropathy on the right. Distal left clavicular deformity from old fracture. Tapered undersurface of the distal right clavicle probably from resection.  IMPRESSION: 1. No acute thoracic findings. 2. Atherosclerotic aortic arch. 3. Degenerative right glenohumeral arthropathy.   Electronically Signed   By: Gaylyn RongWalter  Liebkemann M.D.   On: 11/28/2014 21:10   Dg Hip Unilat With Pelvis 2-3 Views Left  11/28/2014   CLINICAL DATA:  Left hip pain with painful range of motion secondary to a fall.  EXAM: LEFT HIP (WITH PELVIS) 2-3 VIEWS  COMPARISON:  None.  FINDINGS: There is a comminuted fracture of the intertrochanteric region of the proximal left femur with avulsion of the lesser  trochanter. The fracture is angulated and slightly overriding.  Diffuse osteopenia.  Evidence of previous bilateral sacral plasty.  IMPRESSION: Comminuted angulated intertrochanteric fracture of the proximal left femur. Osteopenia.   Electronically Signed   By: Francene BoyersJames  Maxwell M.D.   On: 11/28/2014 17:34     EKG Interpretation   Date/Time:  Sunday Nov 28 2014 20:30:48 EDT Ventricular Rate:  97 PR Interval:  144 QRS Duration: 82 QT Interval:  389 QTC Calculation: 494 R Axis:   -44 Text Interpretation:  Sinus rhythm Left axis deviation Borderline T  abnormalities, anterior leads No significant change since last tracing  Confirmed by  Rhunette CroftNANAVATI, MD, Janey GentaANKIT [phone]([id number]) on 11/28/2014 8:40:21 PM      MDM   Final diagnoses:  Fall  Hip fracture, left, closed, initial encounter    Jennifer [name] is a 81 y.o. female with a past medical history significant for hypertension, osteoporosis, TIAs, and mild dementia who presents with a fall. The patient reported a mechanical fall with no preceding symptoms. The patient denied her head during the fall. The patient is not on anticoagulation medicine. The patient's primary complaint was left hip pain and leg shortening. The patient was neurovascularly intact in her bilateral lower extremities.  Plain films of the hip revealed a femur fracture. The orthopedics team was called and requested admission for possible surgery in the morning. The patient was provided pain medication and had laboratory tests obtained. The patient denies any other complaints on review of systems.  The hospitalist team was called and the patient was admitted in stable condition for further management of her left femur fracture. The patient and her family agree with the plan of care for admission.   This patient was seen with Dr. Rhunette CroftNanavati, ED attending.     Theda Belfasthris Tegeler, MD 11/29/14 [id number]  Derwood KaplanAnkit Nanavati, MD 12/07/14 1740

## 2014-11-28 NOTE — ED Notes (Signed)
Patient transported to X-ray 

## 2014-11-28 NOTE — ED Notes (Signed)
Received pt from home with c/o stumbled and fall at home causing deformity to left hip. Left leg significantly shortened. Pt uses walker at home.

## 2014-11-28 NOTE — Progress Notes (Signed)
Orthopedic Tech Progress Note Patient Details:  Jennifer ButcherJoann M Spragg Apr 10, 1929 [id number]  Musculoskeletal Traction Type of Traction: Bucks Skin Traction Traction Weight: 10 lbs    Shawnie PonsCammer, Elajah Kunsman Carol 11/28/2014, 10:10 PM

## 2014-11-28 NOTE — ED Notes (Signed)
Hospital at the bedside

## 2014-11-29 ENCOUNTER — Inpatient Hospital Stay (HOSPITAL_COMMUNITY): Payer: Medicare Other | Admitting: Certified Registered Nurse Anesthetist

## 2014-11-29 ENCOUNTER — Inpatient Hospital Stay (HOSPITAL_COMMUNITY): Payer: Medicare Other

## 2014-11-29 ENCOUNTER — Encounter (HOSPITAL_COMMUNITY)
Admission: EM | Disposition: A | Discharge status: Skilled Nursing Facility | Payer: Self-pay | Source: Home / Self Care | Attending: Internal Medicine

## 2014-11-29 DIAGNOSIS — R296 Repeated falls: Secondary | ICD-10-CM

## 2014-11-29 DIAGNOSIS — N184 Chronic kidney disease, stage 4 (severe): Secondary | ICD-10-CM

## 2014-11-29 HISTORY — PX: INTRAMEDULLARY (IM) NAIL INTERTROCHANTERIC: SHX5875

## 2014-11-29 LAB — COMPREHENSIVE METABOLIC PANEL
ALT: 12 U/L — ABNORMAL LOW (ref 14–54)
ANION GAP: 8 (ref 5–15)
AST: 18 U/L (ref 15–41)
Albumin: 2.7 g/dL — ABNORMAL LOW (ref 3.5–5.0)
Alkaline Phosphatase: 97 U/L (ref 38–126)
BUN: 26 mg/dL — AB (ref 6–20)
CO2: 22 mmol/L (ref 22–32)
Calcium: 8.8 mg/dL — ABNORMAL LOW (ref 8.9–10.3)
Chloride: 111 mmol/L (ref 101–111)
Creatinine, Ser: 2.03 mg/dL — ABNORMAL HIGH (ref 0.44–1.00)
GFR calc Af Amer: 25 mL/min — ABNORMAL LOW (ref >60)
GFR calc non Af Amer: 22 mL/min — ABNORMAL LOW (ref >60)
GLUCOSE: 106 mg/dL — AB (ref 65–99)
POTASSIUM: 4.3 mmol/L (ref 3.5–5.1)
Sodium: 141 mmol/L (ref 135–145)
TOTAL PROTEIN: 5.8 g/dL — AB (ref 6.5–8.1)
Total Bilirubin: 0.7 mg/dL (ref 0.3–1.2)

## 2014-11-29 LAB — CBC WITH DIFFERENTIAL/PLATELET
Basophils Absolute: 0 10*3/uL (ref 0.0–0.1)
Basophils Relative: 0 % (ref 0–1)
EOS ABS: 0 10*3/uL (ref 0.0–0.7)
Eosinophils Relative: 0 % (ref 0–5)
HEMATOCRIT: 34 % — AB (ref 36.0–46.0)
HEMOGLOBIN: 10.8 g/dL — AB (ref 12.0–15.0)
LYMPHS ABS: 2.2 10*3/uL (ref 0.7–4.0)
LYMPHS PCT: 21 % (ref 12–46)
MCH: 30.3 pg (ref 26.0–34.0)
MCHC: 31.8 g/dL (ref 30.0–36.0)
MCV: 95.2 fL (ref 78.0–100.0)
MONOS PCT: 7 % (ref 3–12)
Monocytes Absolute: 0.7 10*3/uL (ref 0.1–1.0)
NEUTROS ABS: 7.2 10*3/uL (ref 1.7–7.7)
NEUTROS PCT: 72 % (ref 43–77)
Platelets: 203 10*3/uL (ref 150–400)
RBC: 3.57 MIL/uL — ABNORMAL LOW (ref 3.87–5.11)
RDW: 14.4 % (ref 11.5–15.5)
WBC: 10.1 10*3/uL (ref 4.0–10.5)

## 2014-11-29 LAB — SURGICAL PCR SCREEN
MRSA, PCR: NEGATIVE
STAPHYLOCOCCUS AUREUS: NEGATIVE

## 2014-11-29 SURGERY — FIXATION, FRACTURE, INTERTROCHANTERIC, WITH INTRAMEDULLARY ROD
Anesthesia: General | Site: Hip | Laterality: Left

## 2014-11-29 MED ORDER — CEFAZOLIN SODIUM 1-5 GM-% IV SOLN
1.0000 g | Freq: Two times a day (BID) | INTRAVENOUS | Status: AC
  Administered 2014-11-30: 1 g via INTRAVENOUS
  Filled 2014-11-29 (×2): qty 50

## 2014-11-29 MED ORDER — ROCURONIUM BROMIDE 50 MG/5ML IV SOLN
INTRAVENOUS | Status: AC
Start: 2014-11-29 — End: 2014-11-29
  Filled 2014-11-29: qty 1

## 2014-11-29 MED ORDER — OXYCODONE HCL 5 MG PO TABS
5.0000 mg | ORAL_TABLET | ORAL | Status: DC | PRN
  Administered 2014-11-30 – 2014-12-02 (×7): 10 mg via ORAL
  Filled 2014-11-29 (×6): qty 2

## 2014-11-29 MED ORDER — FENTANYL CITRATE (PF) 100 MCG/2ML IJ SOLN
INTRAMUSCULAR | Status: DC | PRN
  Administered 2014-11-29 (×2): 50 ug via INTRAVENOUS

## 2014-11-29 MED ORDER — ONDANSETRON HCL 4 MG/2ML IJ SOLN
4.0000 mg | Freq: Four times a day (QID) | INTRAMUSCULAR | Status: DC | PRN
  Administered 2014-11-29 – 2014-11-30 (×2): 4 mg via INTRAVENOUS
  Filled 2014-11-29: qty 2

## 2014-11-29 MED ORDER — FENTANYL CITRATE (PF) 100 MCG/2ML IJ SOLN
25.0000 ug | INTRAMUSCULAR | Status: DC | PRN
  Administered 2014-11-29 (×2): 25 ug via INTRAVENOUS

## 2014-11-29 MED ORDER — LIDOCAINE HCL (CARDIAC) 20 MG/ML IV SOLN
INTRAVENOUS | Status: AC
  Filled 2014-11-29: qty 5

## 2014-11-29 MED ORDER — MENTHOL 3 MG MT LOZG: 1.0000 | LOZENGE | OROMUCOSAL | Status: DC | PRN

## 2014-11-29 MED ORDER — FENTANYL CITRATE (PF) 100 MCG/2ML IJ SOLN
INTRAMUSCULAR | Status: AC
  Filled 2014-11-29: qty 2

## 2014-11-29 MED ORDER — ACETAMINOPHEN 325 MG PO TABS
650.0000 mg | ORAL_TABLET | Freq: Four times a day (QID) | ORAL | Status: DC | PRN
  Administered 2014-12-02: 650 mg via ORAL
  Filled 2014-11-29: qty 2

## 2014-11-29 MED ORDER — PROPOFOL 10 MG/ML IV BOLUS
INTRAVENOUS | Status: DC | PRN
  Administered 2014-11-29: 80 mg via INTRAVENOUS

## 2014-11-29 MED ORDER — SODIUM CHLORIDE 0.9 % IV SOLN
INTRAVENOUS | Status: DC
  Administered 2014-11-29 (×3): via INTRAVENOUS

## 2014-11-29 MED ORDER — NEOSTIGMINE METHYLSULFATE 10 MG/10ML IV SOLN
INTRAVENOUS | Status: DC | PRN
  Administered 2014-11-29: 3 mg via INTRAVENOUS

## 2014-11-29 MED ORDER — SODIUM CHLORIDE 0.9 % IV SOLN
INTRAVENOUS | Status: DC
  Administered 2014-11-29 – 2014-11-30 (×2): via INTRAVENOUS

## 2014-11-29 MED ORDER — PHENYLEPHRINE HCL 10 MG/ML IJ SOLN
10.0000 mg | INTRAVENOUS | Status: DC | PRN
  Administered 2014-11-29: 30 ug/min via INTRAVENOUS

## 2014-11-29 MED ORDER — HYDROCODONE-ACETAMINOPHEN 5-325 MG PO TABS
1.0000 | ORAL_TABLET | Freq: Four times a day (QID) | ORAL | Status: DC | PRN
  Administered 2014-11-29 – 2014-12-02 (×6): 1 via ORAL
  Administered 2014-12-03 (×2): 2 via ORAL
  Filled 2014-11-29 (×6): qty 1
  Filled 2014-11-29 (×2): qty 2

## 2014-11-29 MED ORDER — BOOST PLUS PO LIQD
237.0000 mL | Freq: Two times a day (BID) | ORAL | Status: DC
  Administered 2014-11-30 – 2014-12-03 (×3): 237 mL via ORAL
  Filled 2014-11-29 (×10): qty 237

## 2014-11-29 MED ORDER — CEFAZOLIN SODIUM-DEXTROSE 2-3 GM-% IV SOLR
2.0000 g | Freq: Two times a day (BID) | INTRAVENOUS | Status: DC
  Filled 2014-11-29: qty 50

## 2014-11-29 MED ORDER — DEXTROSE 5 % IV SOLN
500.0000 mg | Freq: Four times a day (QID) | INTRAVENOUS | Status: DC | PRN
  Filled 2014-11-29: qty 5

## 2014-11-29 MED ORDER — ACETAMINOPHEN 650 MG RE SUPP: 650.0000 mg | Freq: Four times a day (QID) | RECTAL | Status: DC | PRN

## 2014-11-29 MED ORDER — PHENOL 1.4 % MT LIQD
1.0000 | OROMUCOSAL | Status: DC | PRN
  Administered 2014-11-30: 1 via OROMUCOSAL
  Filled 2014-11-29: qty 177

## 2014-11-29 MED ORDER — ENOXAPARIN SODIUM 40 MG/0.4ML ~~LOC~~ SOLN: 40.0000 mg | Freq: Every day | SUBCUTANEOUS | Status: DC

## 2014-11-29 MED ORDER — ENOXAPARIN SODIUM 30 MG/0.3ML ~~LOC~~ SOLN
30.0000 mg | SUBCUTANEOUS | Status: DC
  Administered 2014-11-30 – 2014-12-03 (×4): 30 mg via SUBCUTANEOUS
  Filled 2014-11-29 (×4): qty 0.3

## 2014-11-29 MED ORDER — METOCLOPRAMIDE HCL 5 MG/ML IJ SOLN: 5.0000 mg | Freq: Three times a day (TID) | INTRAMUSCULAR | Status: DC | PRN

## 2014-11-29 MED ORDER — MORPHINE SULFATE 2 MG/ML IJ SOLN: 0.5000 mg | INTRAMUSCULAR | Status: DC | PRN

## 2014-11-29 MED ORDER — ONDANSETRON HCL 4 MG/2ML IJ SOLN
4.0000 mg | Freq: Once | INTRAMUSCULAR | Status: AC | PRN
  Filled 2014-11-29: qty 2

## 2014-11-29 MED ORDER — ONDANSETRON HCL 4 MG PO TABS: 4.0000 mg | ORAL_TABLET | Freq: Four times a day (QID) | ORAL | Status: DC | PRN

## 2014-11-29 MED ORDER — ONDANSETRON HCL 4 MG/2ML IJ SOLN
INTRAMUSCULAR | Status: AC
  Filled 2014-11-29: qty 2

## 2014-11-29 MED ORDER — FENTANYL CITRATE (PF) 250 MCG/5ML IJ SOLN
INTRAMUSCULAR | Status: AC
Start: 2014-11-29 — End: 2014-11-29
  Filled 2014-11-29: qty 5

## 2014-11-29 MED ORDER — 0.9 % SODIUM CHLORIDE (POUR BTL) OPTIME
TOPICAL | Status: DC | PRN
  Administered 2014-11-29: 1000 mL

## 2014-11-29 MED ORDER — OXYCODONE HCL 5 MG PO TABS: 5.0000 mg | ORAL_TABLET | ORAL | Status: DC | PRN

## 2014-11-29 MED ORDER — ROCURONIUM BROMIDE 100 MG/10ML IV SOLN
INTRAVENOUS | Status: DC | PRN
  Administered 2014-11-29: 30 mg via INTRAVENOUS

## 2014-11-29 MED ORDER — METHOCARBAMOL 500 MG PO TABS: 500.0000 mg | ORAL_TABLET | Freq: Four times a day (QID) | ORAL | Status: DC | PRN

## 2014-11-29 MED ORDER — LIDOCAINE HCL (CARDIAC) 20 MG/ML IV SOLN
INTRAVENOUS | Status: DC | PRN
  Administered 2014-11-29: 60 mg via INTRAVENOUS

## 2014-11-29 MED ORDER — ONDANSETRON HCL 4 MG/2ML IJ SOLN
INTRAMUSCULAR | Status: DC | PRN
  Administered 2014-11-29: 4 mg via INTRAVENOUS

## 2014-11-29 MED ORDER — METOCLOPRAMIDE HCL 5 MG PO TABS: 5.0000 mg | ORAL_TABLET | Freq: Three times a day (TID) | ORAL | Status: DC | PRN

## 2014-11-29 MED ORDER — GLYCOPYRROLATE 0.2 MG/ML IJ SOLN
INTRAMUSCULAR | Status: DC | PRN
  Administered 2014-11-29: 0.4 mg via INTRAVENOUS

## 2014-11-29 MED ORDER — ALUM & MAG HYDROXIDE-SIMETH 200-200-20 MG/5ML PO SUSP: 30.0000 mL | ORAL | Status: DC | PRN

## 2014-11-29 MED ORDER — PROPOFOL 10 MG/ML IV BOLUS
INTRAVENOUS | Status: AC
  Filled 2014-11-29: qty 20

## 2014-11-29 MED ORDER — ENOXAPARIN SODIUM 40 MG/0.4ML ~~LOC~~ SOLN: 40.0000 mg | SUBCUTANEOUS | Status: DC

## 2014-11-29 MED ORDER — CEFAZOLIN SODIUM-DEXTROSE 2-3 GM-% IV SOLR
INTRAVENOUS | Status: DC | PRN
  Administered 2014-11-29: 2 g via INTRAVENOUS

## 2014-11-29 SURGICAL SUPPLY — 56 items
BLADE SURG 15 STRL LF DISP TIS (BLADE) ×1 IMPLANT
BLADE SURG 15 STRL SS (BLADE) ×2
BNDG COHESIVE 4X5 TAN NS LF (GAUZE/BANDAGES/DRESSINGS) ×3 IMPLANT
BNDG COHESIVE 6X5 TAN STRL LF (GAUZE/BANDAGES/DRESSINGS) IMPLANT
BNDG GAUZE ELAST 4 BULKY (GAUZE/BANDAGES/DRESSINGS) ×3 IMPLANT
COVER PERINEAL POST (MISCELLANEOUS) ×3 IMPLANT
COVER SURGICAL LIGHT HANDLE (MISCELLANEOUS) ×3 IMPLANT
DRAPE PROXIMA HALF (DRAPES) IMPLANT
DRAPE STERI IOBAN 125X83 (DRAPES) ×3 IMPLANT
DRESSING ALLEVYN LIFE SACRUM (GAUZE/BANDAGES/DRESSINGS) ×3 IMPLANT
DRSG MEPILEX BORDER 4X4 (GAUZE/BANDAGES/DRESSINGS) ×6 IMPLANT
DRSG MEPILEX BORDER 4X8 (GAUZE/BANDAGES/DRESSINGS) IMPLANT
DRSG PAD ABDOMINAL 8X10 ST (GAUZE/BANDAGES/DRESSINGS) ×6 IMPLANT
DURAPREP 26ML APPLICATOR (WOUND CARE) ×3 IMPLANT
ELECT CAUTERY BLADE 6.4 (BLADE) ×3 IMPLANT
ELECT REM PT RETURN 9FT ADLT (ELECTROSURGICAL) ×3
ELECTRODE REM PT RTRN 9FT ADLT (ELECTROSURGICAL) ×1 IMPLANT
FACESHIELD WRAPAROUND (MASK) ×3 IMPLANT
GAUZE XEROFORM 5X9 LF (GAUZE/BANDAGES/DRESSINGS) ×3 IMPLANT
GLOVE BIO SURGEON STRL SZ 6.5 (GLOVE) ×2 IMPLANT
GLOVE BIO SURGEON STRL SZ7 (GLOVE) ×3 IMPLANT
GLOVE BIO SURGEONS STRL SZ 6.5 (GLOVE) ×1
GLOVE BIOGEL PI IND STRL 6.5 (GLOVE) ×2 IMPLANT
GLOVE BIOGEL PI IND STRL 7.0 (GLOVE) ×1 IMPLANT
GLOVE BIOGEL PI INDICATOR 6.5 (GLOVE) ×4
GLOVE BIOGEL PI INDICATOR 7.0 (GLOVE) ×2
GLOVE ECLIPSE 6.5 STRL STRAW (GLOVE) ×3 IMPLANT
GLOVE ECLIPSE 7.0 STRL STRAW (GLOVE) ×3 IMPLANT
GLOVE NEODERM STRL 7.5 LF PF (GLOVE) ×2 IMPLANT
GLOVE SURG NEODERM 7.5  LF PF (GLOVE) ×4
GLOVE SURG SS PI 8.0 STRL IVOR (GLOVE) ×6 IMPLANT
GOWN STRL REIN XL XLG (GOWN DISPOSABLE) ×3 IMPLANT
GUIDE PIN 3.2X343 (PIN) ×1
GUIDE PIN 3.2X343MM (PIN) ×2
KIT BASIN OR (CUSTOM PROCEDURE TRAY) ×3 IMPLANT
KIT ROOM TURNOVER OR (KITS) ×3 IMPLANT
LINER BOOT UNIVERSAL DISP (MISCELLANEOUS) ×3 IMPLANT
MANIFOLD NEPTUNE II (INSTRUMENTS) ×3 IMPLANT
NAIL TRIGEN LEFT 10X38-125 (Nail) ×3 IMPLANT
NS IRRIG 1000ML POUR BTL (IV SOLUTION) ×3 IMPLANT
PACK GENERAL/GYN (CUSTOM PROCEDURE TRAY) ×3 IMPLANT
PAD ARMBOARD 7.5X6 YLW CONV (MISCELLANEOUS) ×6 IMPLANT
PAD CAST 4YDX4 CTTN HI CHSV (CAST SUPPLIES) ×2 IMPLANT
PADDING CAST COTTON 4X4 STRL (CAST SUPPLIES) ×4
PIN GUIDE 3.2X343MM (PIN) ×1 IMPLANT
SCREW LAG COMPR KIT 95/90 (Screw) ×3 IMPLANT
STAPLER VISISTAT 35W (STAPLE) ×3 IMPLANT
SUT VIC AB 0 CT1 27 (SUTURE) ×6
SUT VIC AB 0 CT1 27XBRD ANBCTR (SUTURE) ×2 IMPLANT
SUT VIC AB 1 CT1 27 (SUTURE) ×2
SUT VIC AB 1 CT1 27XBRD ANTBC (SUTURE) ×1 IMPLANT
SUT VIC AB 2-0 CT1 27 (SUTURE) ×6
SUT VIC AB 2-0 CT1 TAPERPNT 27 (SUTURE) ×2 IMPLANT
TOWEL OR 17X24 6PK STRL BLUE (TOWEL DISPOSABLE) ×3 IMPLANT
TOWEL OR 17X26 10 PK STRL BLUE (TOWEL DISPOSABLE) ×3 IMPLANT
WATER STERILE IRR 1000ML POUR (IV SOLUTION) IMPLANT

## 2014-11-29 NOTE — Anesthesia Preprocedure Evaluation (Addendum)
Anesthesia Evaluation  Patient identified by MRN, date of birth, ID band Patient awake    Reviewed: Allergy & Precautions, NPO status , Patient's Chart, lab work & pertinent test results  Airway Mallampati: II  TM Distance: >3 FB Neck ROM: Full    Dental  (+) Edentulous Upper, Partial Lower   Pulmonary former smoker,  breath sounds clear to auscultation        Cardiovascular hypertension, Rhythm:Regular Rate:Normal     Neuro/Psych    GI/Hepatic   Endo/Other    Renal/GU      Musculoskeletal   Abdominal   Peds  Hematology   Anesthesia Other Findings   Reproductive/Obstetrics                            Anesthesia Physical Anesthesia Plan  ASA: III  Anesthesia Plan: MAC and Spinal   Post-op Pain Management:    Induction: Intravenous  Airway Management Planned: Natural Airway and Simple Face Mask  Additional Equipment:   Intra-op Plan:   Post-operative Plan: Extubation in OR  Informed Consent: I have reviewed the patients History and Physical, chart, labs and discussed the procedure including the risks, benefits and alternatives for the proposed anesthesia with the patient or authorized representative who has indicated his/her understanding and acceptance.     Plan Discussed with: CRNA and Anesthesiologist  Anesthesia Plan Comments: (Plan SAB)       Anesthesia Quick Evaluation

## 2014-11-29 NOTE — Discharge Instructions (Signed)
° ° °  1. Change dressings as needed °2. May shower but keep incisions covered and dry °3. Take lovenox to prevent blood clots °4. Take stool softeners as needed °5. Take pain meds as needed ° °

## 2014-11-29 NOTE — Anesthesia Procedure Notes (Signed)
Procedure Name: Intubation Date/Time: 11/29/2014 2:02 PM Performed by: Dairl PonderJIANG, Angelize Ryce Pre-anesthesia Checklist: Patient identified, Timeout performed, Emergency Drugs available, Suction available and Patient being monitored Patient Re-evaluated:Patient Re-evaluated prior to inductionOxygen Delivery Method: Circle system utilized Preoxygenation: Pre-oxygenation with 100% oxygen Intubation Type: IV induction Ventilation: Mask ventilation without difficulty and Oral airway inserted - appropriate to patient size Laryngoscope Size: Mac and 3 Grade View: Grade III Tube type: Oral Number of attempts: 2 Airway Equipment and Method: Bougie stylet Placement Confirmation: breath sounds checked- equal and bilateral and positive ETCO2 Secured at: 22 cm Tube secured with: Tape Dental Injury: Teeth and Oropharynx as per pre-operative assessment

## 2014-11-29 NOTE — Progress Notes (Signed)
PROGRESS NOTE  Jennifer [name] ZOX:[id number] DOB: 1928-11-05 DOA: 11/28/2014 PCP: Rogelia BogaKWIATKOWSKI,PETER FRANK, MD  HPI/Recap of past 24 hours:  Returned from surgery, reported pain and nausea, requesting prn meds. Multiple family member in room.  Assessment/Plan: Principal Problem:   Closed comminuted intertrochanteric fracture of proximal end of left femur Active Problems:   Falls frequently   Hypothyroidism   Anxiety   Dementia   Restless leg syndrome   Essential hypertension   Osteoporosis   TIA (transient ischemic attack)   Fall due to stumbling   Chronic renal failure   Closed comminuted intertrochanteric fracture of proximal femur  Closed comminuted and intratrochanteric left femur fracture -s/p surgery today  -appreciate ortho input.  Hypothyroidism -TSH 1.8 -reported not taking Synthroid 50 g daily.  Essential hypertension -Will hold patient's BP medication at this time  Anxiety -Prozac 40 mg daily  Dementia -Namenda 10 mg BID -Aricept 5 mg QHS  Acute on Chronic renal failure (baseline 1.71) -Normal saline 4775ml/hr -Foley catheter -cr worse than that from 2015, new baseline? Check ua, avoid nephrotoxin  Osteoporosis -Vitamin D 25  Fall due to stumbling -Patient to be evaluated post surgery by PT/OT for CIR vs SNF -Obtain EKG    Code Status: full  Family Communication: patient and family  Disposition Plan: likely SNF   Consultants:  ortho  Procedures: Treatment of intertrochanteric fracture with intramedullary implant 5/16  Antibiotics:  none   Objective: BP 121/68 mmHg  Pulse 97  Temp(Src) 98.5 F (36.9 C) (Axillary)  Resp 17  Ht 5\' 6"  (1.676 m)  Wt 70.761 kg (156 lb)  BMI 25.19 kg/m2  SpO2 97%  Intake/Output Summary (Last 24 hours) at 11/29/14 1816 Last data filed at 11/29/14 1654  Gross per 24 hour  Intake    900 ml  Output    600 ml  Net    300 ml   Filed Weights   11/28/14 1632  Weight: 70.761 kg (156 lb)     Exam:   General:  NAD, frail elderly laying in bed.  Cardiovascular: RRR  Respiratory: CTABL  Abdomen: Soft/ND/NT, positive BS  Musculoskeletal: post op changes left hip  Neuro: baseline dementia.  Data Reviewed: Basic Metabolic Panel:  Recent Labs Lab 11/28/14 1728 11/29/14 0544  NA 139 141  K 4.7 4.3  CL 108 111  CO2 21* 22  GLUCOSE 108* 106*  BUN 22* 26*  CREATININE 1.91* 2.03*  CALCIUM 9.2 8.8*   Liver Function Tests:  Recent Labs Lab 11/28/14 1728 11/29/14 0544  AST 20 18  ALT 12* 12*  ALKPHOS 109 97  BILITOT 0.3 0.7  PROT 6.6 5.8*  ALBUMIN 3.2* 2.7*   No results for input(s): LIPASE, AMYLASE in the last 168 hours. No results for input(s): AMMONIA in the last 168 hours. CBC:  Recent Labs Lab 11/28/14 1728 11/29/14 0544  WBC 9.3 10.1  NEUTROABS 6.5 7.2  HGB 12.3 10.8*  HCT 38.5 34.0*  MCV 96.0 95.2  PLT 265 203   Cardiac Enzymes:   No results for input(s): CKTOTAL, CKMB, CKMBINDEX, TROPONINI in the last 168 hours. BNP (last 3 results) No results for input(s): BNP in the last 8760 hours.  ProBNP (last 3 results) No results for input(s): PROBNP in the last 8760 hours.  CBG: No results for input(s): GLUCAP in the last 168 hours.  Recent Results (from the past 240 hour(s))  Surgical pcr screen     Status: None   Collection Time: 11/29/14 10:14 AM  Result Value Ref Range Status   MRSA, PCR NEGATIVE NEGATIVE Final   Staphylococcus aureus NEGATIVE NEGATIVE Final    Comment:        The Xpert SA Assay (FDA approved for NASAL specimens in patients over 79 years of age), is one component of a comprehensive surveillance program.  Test performance has been validated by [facility] for patients greater than or equal to 79 year old. It is not intended to diagnose infection nor to guide or monitor treatment.      Studies: Chest Portable 1 View  11/28/2014   CLINICAL DATA:  Fall with comminuted intertrochanteric fracture the  proximal left femur, preoperative.  EXAM: PORTABLE CHEST - 1 VIEW  COMPARISON:  03/19/2012  FINDINGS: Atherosclerotic calcification of the aortic arch noted. Upper normal heart size.  The lungs appear clear. Moderate degenerative glenohumeral arthropathy on the right. Distal left clavicular deformity from old fracture. Tapered undersurface of the distal right clavicle probably from resection.  IMPRESSION: 1. No acute thoracic findings. 2. Atherosclerotic aortic arch. 3. Degenerative right glenohumeral arthropathy.   Electronically Signed   By: Gaylyn RongWalter  Liebkemann M.D.   On: 11/28/2014 21:10   Dg Hip Unilat With Pelvis 2-3 Views Left  11/28/2014   CLINICAL DATA:  Left hip pain with painful range of motion secondary to a fall.  EXAM: LEFT HIP (WITH PELVIS) 2-3 VIEWS  COMPARISON:  None.  FINDINGS: There is a comminuted fracture of the intertrochanteric region of the proximal left femur with avulsion of the lesser trochanter. The fracture is angulated and slightly overriding.  Diffuse osteopenia.  Evidence of previous bilateral sacral plasty.  IMPRESSION: Comminuted angulated intertrochanteric fracture of the proximal left femur. Osteopenia.   Electronically Signed   By: Francene BoyersJames  Maxwell M.D.   On: 11/28/2014 17:34    Scheduled Meds: . sodium chloride   Intravenous Once  . aspirin EC  81 mg Oral Daily  . calcium-vitamin D  1 tablet Oral Daily  . [START ON 11/30/2014]  ceFAZolin (ANCEF) IV  1 g Intravenous Q12H  . docusate sodium  100 mg Oral BID  . donepezil  5 mg Oral QHS  . [START ON 11/30/2014] enoxaparin (LOVENOX) injection  30 mg Subcutaneous Q24H  . fentaNYL      . FLUoxetine  40 mg Oral Daily  . [START ON 11/30/2014] lactose free nutrition  237 mL Oral BID BM  . levothyroxine  50 mcg Oral QAC breakfast  . memantine  10 mg Oral BID  . pantoprazole  40 mg Oral Daily    Continuous Infusions: . sodium chloride 10 mL/hr at 11/29/14 1258  . sodium chloride       Time spent: 25mins  Shamila Lerch MD,  PhD  [facility] Pager [phone]. If 7PM-7AM, please contact night-coverage at [email or web address], password [email or web address] 11/29/2014, 6:16 PM  LOS: 1 day

## 2014-11-29 NOTE — Progress Notes (Signed)
SLP Cancellation Note  Patient Details Name: Jennifer [name] MRN: [id number] DOB: 12/11/1928   Cancelled treatment:       Reason Eval/Treat Not Completed: Patient at procedure or test/unavailable. Per RN, patient just left room for surgery.  Jennifer LangoLeah Rifky Lapre MA, CCC-SLP [phone]    Jennifer LangoMcCoy Wynette Jersey Meryl 11/29/2014, 12:59 PM

## 2014-11-29 NOTE — Op Note (Signed)
   Date of Surgery: 11/29/2014  INDICATIONS: Ms. Roger ShelterGordon is a 81 y.o.-year-old female who suffered a ground level fall and sustained a left hip fracture (pertrochanteric-type). The risks and benefits of the procedure discussed with the family prior to the procedure and all questions were answered; consent was obtained.  PREOPERATIVE DIAGNOSIS: left hip fracture (intertrochanteric-type)   POSTOPERATIVE DIAGNOSIS: Same   PROCEDURE: Treatment of intertrochanteric fracture with intramedullary implant. CPT [phone]   SURGEON: [name] Xu, M.D.   ANESTHESIA: general   IV FLUIDS AND URINE: See anesthesia record   ESTIMATED BLOOD LOSS: 200 cc  IMPLANTS: [employer]hew InterTAN 10 x 38, 95/90  DRAINS: None.   COMPLICATIONS: None.   DESCRIPTION OF PROCEDURE: The patient was brought to the operating room and placed supine on the operating table. The patient's leg had been signed prior to the procedure. The patient had the anesthesia placed by the anesthesiologist. The prep verification and incision time-outs were performed to confirm that this was the correct patient, site, side and location. The patient had an SCD on the opposite lower extremity. The patient did receive antibiotics prior to the incision and was re-dosed during the procedure as needed at indicated intervals. The patient was positioned on the fracture table with the table in traction and internal rotation to reduce the hip. The well leg was placed in a scissor position and all bony prominences were well-padded. The patient had the lower extremity prepped and draped in the standard surgical fashion. The incision was made 4 finger breadths superior to the greater trochanter. A guide pin was inserted into the tip of the greater trochanter under fluoroscopic guidance. An opening reamer was used to gain access to the femoral canal. The nail length was measured and inserted down the femoral canal to its proper depth. The appropriate version of  insertion for the lag screw was found under fluoroscopy. A pin was inserted up the femoral neck through the jig. Then, a second antirotation pin was inserted inferior to the first pin. The length of the lag screw was then measured. The lag screw was inserted as near to center-center in the head as possible. The antirotation pin was then taken out and an interdigitating compression screw was placed in its place. The leg was taken out of traction, then the interdigitating compression screw was used to compress across the fracture. Compression was visualized on serial xrays. The wound was copiously irrigated with saline and the subcutaneous layer closed with 2.0 vicryl and the skin was reapproximated with staples. The wounds were cleaned and dried a final time and a sterile dressing was placed. The hip was taken through a range of motion at the end of the case under fluoroscopic imaging to visualize the approach-withdraw phenomenon and confirm implant length in the head. The patient was then awakened from anesthesia and taken to the recovery room in stable condition. All counts were correct at the end of the case.   POSTOPERATIVE PLAN: The patient will be weight bearing as tolerated and will return in 2 weeks for staple removal and the patient will receive DVT prophylaxis based on other medications, activity level, and risk ratio of bleeding to thrombosis.   Mayra ReelN. Michael Xu, MD [facility] [phone] 2:55 PM

## 2014-11-29 NOTE — Transfer of Care (Signed)
Immediate Anesthesia Transfer of Care Note  Patient: Serita ButcherJoann M Kesling  Procedure(s) Performed: Procedure(s): INTRAMEDULLARY (IM) NAIL INTERTROCHANTRIC (Left)  Patient Location: PACU  Anesthesia Type:General  Level of Consciousness: awake  Airway & Oxygen Therapy: Patient Spontanous Breathing and Patient connected to face mask oxygen  Post-op Assessment: Report given to RN and Post -op Vital signs reviewed and stable  Post vital signs: Reviewed and stable  Last Vitals:  Filed Vitals:   11/29/14 1524  BP: 139/73  Pulse:   Temp: 37.2 C  Resp: 15    Complications: No apparent anesthesia complications

## 2014-11-29 NOTE — Progress Notes (Signed)
Initial Nutrition Assessment  DOCUMENTATION CODES:  Not applicable  INTERVENTION:   (Once diet advances, provide Boost Plus BID, each supplement provides 360 kcal and 14 grams of protein)  NUTRITION DIAGNOSIS:  Increased nutrient needs related to  (s/p surgery) as evidenced by estimated needs.  GOAL:  Patient will meet greater than or equal to 90% of their needs  MONITOR:  Diet advancement, Weight trends, Labs, I & O's  REASON FOR ASSESSMENT:  Consult Assessment of nutrition requirement/status  ASSESSMENT: Pt with PMHx anxiety, dementia, restless leg syndrome, hypertension, hypothyroidism, osteoporosis, multiple TIAs, and mild dementia who presents with a fall and L hip pain. Pt with Comminuted angulated intertrochanteric fracture of the proximal left femur.  Pt is currently NPO for surgery today. Pt reports having a good appetite with 2 full meals a day with occasional snacks and no other difficulties. Weight has been stable. Pt is agreeable to Boost plus once diet advances as pt has consumed them in the past and enjoys them. RD to order. Of note, during time of visit, pt with n/v.   Pt with no significant fat or muscle mass loss.  Labs: Low calcium, ALT, and GFR. High BUN and creatinine.  Height:  Ht Readings from Last 1 Encounters:  11/28/14 5\' 6"  (1.676 m)    Weight:  Wt Readings from Last 1 Encounters:  11/28/14 156 lb (70.761 kg)    Ideal Body Weight:  59 kg  Wt Readings from Last 10 Encounters:  11/28/14 156 lb (70.761 kg)  01/28/14 169 lb (76.658 kg)  03/30/13 160 lb (72.576 kg)  12/12/12 141 lb (63.957 kg)  01/31/12 143 lb (64.864 kg)  10/23/11 165 lb (74.844 kg)  09/04/11 166 lb (75.297 kg)  10/03/10 161 lb (73.029 kg)  02/06/10 145 lb (65.772 kg)  12/20/09 140 lb (63.504 kg)    BMI:  Body mass index is 25.19 kg/(m^2).  Estimated Nutritional Needs:  Kcal:  1700-1900  Protein:  80-90 grams  Fluid:  1.7 - 1.9 L/day  Skin:    (Non-pitting LLE edema)  Diet Order:  Diet NPO time specified  EDUCATION NEEDS:  No education needs identified at this time   Intake/Output Summary (Last 24 hours) at 11/29/14 1328 Last data filed at 11/29/14 0700  Gross per 24 hour  Intake      0 ml  Output    400 ml  Net   -400 ml    Last BM:  5/14  Marijean NiemannStephanie La, MS, RD, LDN Pager # [phone] After hours/ weekend pager # [phone]

## 2014-11-29 NOTE — Progress Notes (Signed)
Orthopedic Tech Progress Note Patient Details:  Serita ButcherJoann M Baity 1929-03-03 [id number]  Patient ID: Serita ButcherJoann M Tilmon, female   DOB: 1929-03-03, 81 y.o.   MRN: [id number] Pt unable to use trapeze bar patient helper  Nikki DomCrawford, Elzia Hott 11/29/2014, 10:11 PM

## 2014-11-29 NOTE — Anesthesia Postprocedure Evaluation (Signed)
  Anesthesia Post-op Note  Patient: Jennifer ButcherJoann M Descoteaux  Procedure(s) Performed: Procedure(s): INTRAMEDULLARY (IM) NAIL INTERTROCHANTRIC (Left)  Patient Location: PACU  Anesthesia Type:General  Level of Consciousness: awake, alert  and oriented  Airway and Oxygen Therapy: Patient Spontanous Breathing and Patient connected to nasal cannula oxygen  Post-op Pain: mild  Post-op Assessment: Post-op Vital signs reviewed, Patient's Cardiovascular Status Stable, Respiratory Function Stable and Patent Airway  Post-op Vital Signs: stable  Last Vitals:  Filed Vitals:   11/29/14 1712  BP: 121/68  Pulse: 97  Temp: 36.9 C  Resp: 17    Complications: No apparent anesthesia complications

## 2014-11-29 NOTE — Consult Note (Signed)
ORTHOPAEDIC CONSULTATION  REQUESTING PHYSICIAN: Albertine GratesFang Xu, MD  Chief Complaint: left hip fx  HPI: Jennifer [name] is a 81 y.o. female who complains of left hip pain s/p mechanical fall.  Lives indepedently.  Walks with walker.  Denies LOC, neck pain, abd pain.  Ortho consulted.  Past Medical History  Diagnosis Date  . ALLERGIC RHINITIS 02/04/2007  . Altered mental status 11/05/2008  . ANEMIA-IRON DEFICIENCY 08/11/2008  . ANEMIA 06/30/2008  . ANXIETY 08/11/2008  . FREQUENCY, URINARY 04/28/2008  . GERD 02/04/2007  . HYPERTENSION 02/04/2007  . Hypopotassemia 11/08/2008  . HYPOTHYROIDISM 02/04/2007  . LOW BACK PAIN 12/31/2007  . OSTEOARTHRITIS 12/31/2007  . OSTEOPOROSIS 02/04/2007  . PRURITUS 06/03/2009  . RESTLESS LEG SYNDROME 03/18/2009  . Vascular dementia with delusions 11/25/2008  . WEAKNESS 11/05/2008  . WEIGHT LOSS 11/05/2008   Past Surgical History  Procedure Laterality Date  . Hemorrhoid surgery    . Abdominal hysterectomy    . Tonsillectomy    . Knee surgery      right  . Laminectomy    . Back surgery    . Tonsillectomy    . Knee arthroscopy      Right  . Appendectomy    . Lumbar laminectomy/decompression microdiscectomy  12/31/2011    Procedure: LUMBAR LAMINECTOMY/DECOMPRESSION MICRODISCECTOMY 1 LEVEL;  Surgeon: Cristi LoronJeffrey D Jenkins, MD;  Location: [facility];  Service: Neurosurgery;  Laterality: Left;  Left Lumbar Four-Five Discectomy redo    History   Social History  . Marital Status: Widowed    Spouse Name: N/A  . Number of Children: N/A  . Years of Education: N/A   Social History Main Topics  . Smoking status: Former Smoker    Quit date: 07/16/1988  . Smokeless tobacco: Never Used  . Alcohol Use: No  . Drug Use: No  . Sexual Activity: Not on file   Other Topics Concern  . None   Social History Narrative   No family history on file. Allergies  Allergen Reactions  . Ciprofloxacin     REACTION: itching  . Diazepam     REACTION: unspecified  .  Meperidine Hcl     REACTION: unspecified   Prior to Admission medications   Medication Sig Start Date End Date Taking? Authorizing Provider  amLODipine (NORVASC) 10 MG tablet TAKE 1 TABLET BY MOUTH EVERY DAY 10/07/14  Yes Gordy SaversPeter F Kwiatkowski, MD  aspirin 81 MG tablet Take 81 mg by mouth daily.     Yes Historical Provider, MD  Calcium Carbonate-Vitamin D (CALCIUM + D PO) Take 1 tablet by mouth 2 (two) times daily.   Yes Historical Provider, MD  diphenhydrAMINE (BENADRYL) 25 MG tablet Take 25 mg by mouth as needed.   Yes Historical Provider, MD  donepezil (ARICEPT) 5 MG tablet TAKE 1 TABLET BY MOUTH AT BEDTIME 01/28/14  Yes Gordy SaversPeter F Kwiatkowski, MD  FLUoxetine (PROZAC) 40 MG capsule TAKE ONE CAPSULE BY MOUTH EVERY DAY 10/07/14  Yes Gordy SaversPeter F Kwiatkowski, MD  levothyroxine (SYNTHROID, LEVOTHROID) 50 MCG tablet TAKE 1 TABLET BY MOUTH EVERY DAY 01/28/14  Yes Gordy SaversPeter F Kwiatkowski, MD  losartan (COZAAR) 100 MG tablet TAKE 1 TABLET BY MOUTH EVERY DAY 09/27/14  Yes Gordy SaversPeter F Kwiatkowski, MD  memantine (NAMENDA) 10 MG tablet TAKE 1 TABLET BY MOUTH TWICE A DAY 08/18/14  Yes Gordy SaversPeter F Kwiatkowski, MD  naproxen sodium (ALEVE) 220 MG tablet Take 220 mg by mouth daily as needed. For pain   Yes Historical Provider, MD  ranitidine (ZANTAC)  75 MG tablet Take 75 mg by mouth daily.   Yes Historical Provider, MD  benzonatate (TESSALON) 100 MG capsule Take 1 capsule (100 mg total) by mouth 2 (two) times daily as needed for cough. Patient not taking: Reported on 11/28/2014 01/28/14   Gordy SaversPeter F Kwiatkowski, MD  levothyroxine (SYNTHROID, LEVOTHROID) 50 MCG tablet TAKE 1 TABLET BY MOUTH EVERY DAY Patient not taking: Reported on 11/28/2014 10/11/14   Gordy SaversPeter F Kwiatkowski, MD   Chest Portable 1 View  11/28/2014   CLINICAL DATA:  Fall with comminuted intertrochanteric fracture the proximal left femur, preoperative.  EXAM: PORTABLE CHEST - 1 VIEW  COMPARISON:  03/19/2012  FINDINGS: Atherosclerotic calcification of the aortic arch noted. Upper  normal heart size.  The lungs appear clear. Moderate degenerative glenohumeral arthropathy on the right. Distal left clavicular deformity from old fracture. Tapered undersurface of the distal right clavicle probably from resection.  IMPRESSION: 1. No acute thoracic findings. 2. Atherosclerotic aortic arch. 3. Degenerative right glenohumeral arthropathy.   Electronically Signed   By: Gaylyn RongWalter  Liebkemann M.D.   On: 11/28/2014 21:10   Dg Hip Unilat With Pelvis 2-3 Views Left  11/28/2014   CLINICAL DATA:  Left hip pain with painful range of motion secondary to a fall.  EXAM: LEFT HIP (WITH PELVIS) 2-3 VIEWS  COMPARISON:  None.  FINDINGS: There is a comminuted fracture of the intertrochanteric region of the proximal left femur with avulsion of the lesser trochanter. The fracture is angulated and slightly overriding.  Diffuse osteopenia.  Evidence of previous bilateral sacral plasty.  IMPRESSION: Comminuted angulated intertrochanteric fracture of the proximal left femur. Osteopenia.   Electronically Signed   By: Francene BoyersJames  Maxwell M.D.   On: 11/28/2014 17:34    Positive ROS: All other systems have been reviewed and were otherwise negative with the exception of those mentioned in the HPI and as above.  Physical Exam: General: Alert, no acute distress Cardiovascular: No pedal edema Respiratory: No cyanosis, no use of accessory musculature GI: No organomegaly, abdomen is soft and non-tender Skin: No lesions in the area of chief complaint Neurologic: Sensation intact distally Psychiatric: Patient is competent for consent with normal mood and affect Lymphatic: No axillary or cervical lymphadenopathy  MUSCULOSKELETAL:  - painful movement of left hip - skin intact - NVI distally - compartments soft  Assessment: Left intertroch hip fx  Plan: - needs surgical treatment - patient understands r/b/a - consent signed  Thank you for the consult and the opportunity to see Jennifer [name]  [name] Xu,  MD [facility] [phone] 7:31 AM

## 2014-11-30 DIAGNOSIS — N183 Chronic kidney disease, stage 3 (moderate): Secondary | ICD-10-CM

## 2014-11-30 LAB — BASIC METABOLIC PANEL
Anion gap: 6 (ref 5–15)
BUN: 30 mg/dL — ABNORMAL HIGH (ref 6–20)
CO2: 21 mmol/L — ABNORMAL LOW (ref 22–32)
Calcium: 8.3 mg/dL — ABNORMAL LOW (ref 8.9–10.3)
Chloride: 112 mmol/L — ABNORMAL HIGH (ref 101–111)
Creatinine, Ser: 2.19 mg/dL — ABNORMAL HIGH (ref 0.44–1.00)
GFR calc non Af Amer: 20 mL/min — ABNORMAL LOW (ref >60)
GFR, EST AFRICAN AMERICAN: 23 mL/min — AB (ref >60)
GLUCOSE: 125 mg/dL — AB (ref 65–99)
POTASSIUM: 4.5 mmol/L (ref 3.5–5.1)
SODIUM: 139 mmol/L (ref 135–145)

## 2014-11-30 LAB — CBC
HCT: 28.5 % — ABNORMAL LOW (ref 36.0–46.0)
HEMOGLOBIN: 9 g/dL — AB (ref 12.0–15.0)
MCH: 30.9 pg (ref 26.0–34.0)
MCHC: 31.6 g/dL (ref 30.0–36.0)
MCV: 97.9 fL (ref 78.0–100.0)
Platelets: 157 10*3/uL (ref 150–400)
RBC: 2.91 MIL/uL — AB (ref 3.87–5.11)
RDW: 14.7 % (ref 11.5–15.5)
WBC: 9.6 10*3/uL (ref 4.0–10.5)

## 2014-11-30 NOTE — Evaluation (Addendum)
Physical Therapy Evaluation Patient Details Name: Jennifer [name] MRN: [id number] DOB: 09-Apr-1929 Today's Date: 11/30/2014   History of Present Illness  Pt is a 81 y/o F s/p fall and L hip IM nail.  Pt's PMH includes aMS, anemia, anxiety, HTN, LBP, osteoporosis,r estless leg syndrome, weakness, vascular dementia w/ delusions, laminectomy, and R knee arthroscopy.  Clinical Impression  Patient is s/p above surgery resulting in functional limitations due to the deficits listed below (see PT Problem List). Pt limited this session 2/2 severe pain in L hip and anxiety w/ tranfers w/ fear of falling.  Pt required +2 assist to perform sit>stand and stand pivot transfer, PT reassuring pt that she is safe and will not fall during transfers.  Pt and pt's family agree that d/c to SNF is most appropriate to safely improve pt's functional independence.  Patient will benefit from skilled PT to increase their independence and safety with mobility to allow discharge to the venue listed below.      Follow Up Recommendations SNF;Supervision/Assistance - 24 hour    Equipment Recommendations   (TBD by next venue of care)    Recommendations for Other Services       Precautions / Restrictions Precautions Precautions: Fall Precaution Comments: admitted s/p fall Restrictions Weight Bearing Restrictions: Yes LLE Weight Bearing: Weight bearing as tolerated      Mobility  Bed Mobility Overal bed mobility: Needs Assistance Bed Mobility: Supine to Sit     Supine to sit: Mod assist;HOB elevated     General bed mobility comments: Mod assist for managing BLEs to sitting EOB.  Max verbal cues for hand placement and technique.  Increased time  Transfers Overall transfer level: Needs assistance Equipment used:  (2 person physical assist) Transfers: Sit to/from [employer] CorporationStand;Stand Pivot Transfers Sit to Stand: Max assist;+2 physical assistance;From elevated surface Stand pivot transfers: Max assist;+2 physical  assistance       General transfer comment: Pt attempt x4 sit>stand using RW and 1 person assist; however pt c/o pain in L hip once WB and does not rise to standing.  +2 max assist to perform sit>stand and stand pivot transfer w/ verbal cues.  Ambulation/Gait             General Gait Details: limited to pivotal steps only this session  Stairs            Wheelchair Mobility    Modified Rankin (Stroke Patients Only)       Balance Overall balance assessment: Needs assistance Sitting-balance support: Bilateral upper extremity supported;Feet supported Sitting balance-Leahy Scale: Fair Sitting balance - Comments: Pt able to perform LAQ sitting EOB w/ BUE supported on bed Postural control: Posterior lean Standing balance support: Bilateral upper extremity supported;During functional activity Standing balance-Leahy Scale: Zero                               Pertinent Vitals/Pain Pain Assessment: Faces Pain Score: 5  Faces Pain Scale: Hurts whole lot Pain Location: L hip Pain Descriptors / Indicators: Grimacing;Moaning;Guarding;Aching Pain Intervention(s): Limited activity within patient's tolerance;Monitored during session;Repositioned    Home Living Family/patient expects to be discharged to:: Skilled nursing facility Living Arrangements: Alone Available Help at Discharge: Family;Available 24 hours/day (daughter) Type of Home: House Home Access: Ramped entrance     Home Layout: One level Home Equipment: Walker - 4 wheels;Toilet riser;Bedside commode;Shower seat;Walker - 2 wheels (rollator) Additional Comments: After d/c from SNF pt is planning to  live w/ pt's daughter who will provide assist 24/7    Prior Function Level of Independence: Independent with assistive device(s)         Comments: Rollator at all times.  Was using rollator when getting into car when she fell     Hand Dominance   Dominant Hand: Right    Extremity/Trunk Assessment                Lower Extremity Assessment: Generalized weakness;LLE deficits/detail   LLE Deficits / Details: weakness and limited ROM s/p surgery     Communication   Communication: No difficulties  Cognition Arousal/Alertness: Awake/alert Behavior During Therapy: WFL for tasks assessed/performed Overall Cognitive Status: History of cognitive impairments - at baseline                      General Comments General comments (skin integrity, edema, etc.): Pt's son reports pt has low tolerance for pain.  Reassured pt during stand pivot transfer that she was safe as she kept saying, "I'm going to fall".      Exercises Total Joint Exercises Ankle Circles/Pumps: AROM;Both;10 reps;Supine Quad Sets: AROM;Both;10 reps;Supine Hip ABduction/ADduction: AAROM;Left;Supine;10 reps Long Arc Quad: AROM;Left;10 reps;Seated      Assessment/Plan    PT Assessment Patient needs continued PT services  PT Diagnosis Difficulty walking;Abnormality of gait;Generalized weakness;Acute pain   PT Problem List Decreased range of motion;Decreased strength;Decreased activity tolerance;Decreased balance;Decreased mobility;Decreased coordination;Decreased cognition;Decreased knowledge of use of DME;Decreased safety awareness;Decreased knowledge of precautions;Cardiopulmonary status limiting activity;Decreased skin integrity;Pain  PT Treatment Interventions DME instruction;Gait training;Functional mobility training;Therapeutic activities;Therapeutic exercise;Balance training;Neuromuscular re-education;Cognitive remediation;Patient/family education;Modalities;Wheelchair mobility training   PT Goals (Current goals can be found in the Care Plan section) Acute Rehab PT Goals Patient Stated Goal: to have pain go away PT Goal Formulation: With patient/family Time For Goal Achievement: 12/07/14 Potential to Achieve Goals: Fair    Frequency Min 5X/week   Barriers to discharge        Co-evaluation                End of Session Equipment Utilized During Treatment: Gait belt;Oxygen Activity Tolerance: Patient limited by pain;Other (comment) (limited by fear of falling and anxiety) Patient left: in chair;with call bell/phone within reach;with family/visitor present;with nursing/sitter in room Nurse Communication: Mobility status;Precautions;Weight bearing status         Time: [id number] PT Time Calculation (min) (ACUTE ONLY): 34 min   Charges:   PT Evaluation $Initial PT Evaluation Tier I: 1 Procedure PT Treatments $Therapeutic Exercise: 8-22 mins   PT G Codes:       Michail JewelsAshley Parr PT, DPT [phone] Pager: [phone] 11/30/2014, 11:40 AM

## 2014-11-30 NOTE — Progress Notes (Signed)
OT Cancellation Note  Patient Details Name: Jennifer [name] MRN: [id number] DOB: [birth year]   Cancelled Treatment:    Reason Eval/Treat Not Completed: OT screened.  Pt 's current D/C plan is SNF. No apparent immediate acute care OT needs, therefore will defer OT to SNF. If OT eval is needed please call Acute Rehab Dept. at [phone] or text page OT at [phone].     Earlie RavelingStraub, Revanth Neidig L OTR/L [phone] 11/30/2014, 2:29 PM

## 2014-11-30 NOTE — Clinical Social Work Note (Signed)
Clinical Social Work Assessment  Patient Details  Name: Jennifer [name] MRN: [id number] Date of Birth: Oct 03, 1928  Date of referral:  11/30/14               Reason for consult:  Discharge Planning, Facility Placement                Permission sought to share information with:  Family Supports Permission granted to share information::  No (Patient alert to self only. Patient's son, Marya Amsler, at bedside.)  Name::     Lakya Schrupp  Agency::     Relationship::  Adult Child, Son  Contact Information:     Housing/Transportation Living arrangements for the past 2 months:  Single Family Home Source of Information:  Adult Children Patient Interpreter Needed:  None Criminal Activity/Legal Involvement Pertinent to Current Situation/Hospitalization:  No - Comment as needed Significant Relationships:  Adult Children Lives with:  Self Do you feel safe going back to the place where you live?  No (High fall risk.) Need for family participation in patient care:  Yes (Comment) (Patient oriented to self only.)  Care giving concerns:  Patient's son expressed no concerns regarding discharge disposition at this time.   Social Worker assessment / plan:  CSW met with patient's son at bedside to discuss discharge disposition. Patient's son informed CSW patient's family aware and agreeable to PT recommendation for SNF placement at time of discharge. Patient's son expressed no preference in SNF facility. CSW to initiate SNF search to [facility].  Employment status:  Retired Nurse, adult PT Recommendations:  [place] / Referral to community resources:  [place]  Patient/Family's Response to care:  Patient's son understanding and agreeable to CSW plan of care.  Patient/Family's Understanding of and Emotional Response to Diagnosis, Current Treatment, and Prognosis:  Patient's son understanding and agreeable to CSW plan of  care.  Emotional Assessment Appearance:  Other (Comment Required (Patient in bathroom during bedside assessment.) Attitude/Demeanor/Rapport:  Other (Patient in bathroom during bedside assessment.) Affect (typically observed):  Other (Patient in bathroom during bedside assessment.) Orientation:  Oriented to Self Alcohol / Substance use:  Not Applicable Psych involvement (Current and /or in the community):  No (Comment) (Not appropriate on this admission.)  Discharge Needs  Concerns to be addressed:  No discharge needs identified Readmission within the last 30 days:  No Current discharge risk:  None Barriers to Discharge:  No Barriers Identified   Caroline Sauger, LCSW 11/30/2014, 12:16 PM [phone]

## 2014-11-30 NOTE — Evaluation (Signed)
Clinical/Bedside Swallow Evaluation Patient Details  Name: Jennifer ButcherJoann M Love MRN: [id number] Date of Birth: Jan 22, 1929  Today's Date: 11/30/2014 Time: SLP Start Time (ACUTE ONLY): 1046 SLP Stop Time (ACUTE ONLY): 1103 SLP Time Calculation (min) (ACUTE ONLY): 17 min  Past Medical History:  Past Medical History  Diagnosis Date  . ALLERGIC RHINITIS 02/04/2007  . Altered mental status 11/05/2008  . ANEMIA-IRON DEFICIENCY 08/11/2008  . ANEMIA 06/30/2008  . ANXIETY 08/11/2008  . FREQUENCY, URINARY 04/28/2008  . GERD 02/04/2007  . HYPERTENSION 02/04/2007  . Hypopotassemia 11/08/2008  . HYPOTHYROIDISM 02/04/2007  . LOW BACK PAIN 12/31/2007  . OSTEOARTHRITIS 12/31/2007  . OSTEOPOROSIS 02/04/2007  . PRURITUS 06/03/2009  . RESTLESS LEG SYNDROME 03/18/2009  . Vascular dementia with delusions 11/25/2008  . WEAKNESS 11/05/2008  . WEIGHT LOSS 11/05/2008   Past Surgical History:  Past Surgical History  Procedure Laterality Date  . Hemorrhoid surgery    . Abdominal hysterectomy    . Tonsillectomy    . Knee surgery      right  . Laminectomy    . Back surgery    . Tonsillectomy    . Knee arthroscopy      Right  . Appendectomy    . Lumbar laminectomy/decompression microdiscectomy  12/31/2011    Procedure: LUMBAR LAMINECTOMY/DECOMPRESSION MICRODISCECTOMY 1 LEVEL;  Surgeon: Cristi LoronJeffrey D Jenkins, MD;  Location: [facility];  Service: Neurosurgery;  Laterality: Left;  Left Lumbar Four-Five Discectomy redo    HPI:  Pt is a 81 y/o F s/p fall and L hip IM nail. Pt's PMH includes aMS, anemia, anxiety, HTN, LBP, osteoporosis,r estless leg syndrome, weakness, vascular dementia w/ delusions, laminectomy, and R knee arthroscopy.   Assessment / Plan / Recommendation Clinical Impression  Pt's oropharyngeal swallow appears WFL without overt signs of aspiration or dysphagia. Would continue with a regular diet and thin liquids. No further SLP f/u indicated, will sign off.    Aspiration Risk  Mild    Diet  Recommendation Age appropriate regular solids;Thin   Medication Administration: Whole meds with liquid Compensations: Slow rate;Small sips/bites    Other  Recommendations Oral Care Recommendations: Oral care BID   Follow Up Recommendations       Frequency and Duration        Pertinent Vitals/Pain n/a    SLP Swallow Goals     Swallow Study Prior Functional Status  Type of Home: House Available Help at Discharge: Family;Available 24 hours/day (daughter)    General Other Pertinent Information: Pt is a 81 y/o F s/p fall and L hip IM nail. Pt's PMH includes aMS, anemia, anxiety, HTN, LBP, osteoporosis,r estless leg syndrome, weakness, vascular dementia w/ delusions, laminectomy, and R knee arthroscopy. Type of Study: Bedside swallow evaluation Diet Prior to this Study: Regular;Thin liquids Temperature Spikes Noted: No Respiratory Status: Room air History of Recent Intubation: No Behavior/Cognition: Alert;Cooperative;Pleasant mood;Requires cueing Oral Cavity - Dentition: Adequate natural dentition/normal for age Self-Feeding Abilities: Able to feed self Patient Positioning: Upright in chair/Tumbleform Baseline Vocal Quality: Normal    Oral/Motor/Sensory Function Overall Oral Motor/Sensory Function: Appears within functional limits for tasks assessed   Ice Chips Ice chips: Not tested   Thin Liquid Thin Liquid: Within functional limits Presentation: Cup;Self Fed;Straw    Nectar Thick Nectar Thick Liquid: Not tested   Honey Thick Honey Thick Liquid: Not tested   Puree Puree: Within functional limits Presentation: Self Fed;Spoon   Solid    Solid: Within functional limits Presentation: Self Fed      Maxcine HamLaura Paiewonsky,  M.A. CCC-SLP [phone]  Henrine ScrewsPaiewonsky, Vernona RiegerLaura 11/30/2014,12:17 PM

## 2014-11-30 NOTE — Progress Notes (Signed)
   Subjective:  Patient reports pain as mild.    Objective:   VITALS:   Filed Vitals:   11/29/14 2054 11/30/14 0103 11/30/14 0518 11/30/14 1300  BP: 97/58 125/62 104/51 111/52  Pulse: 91 95 91 96  Temp: 98.7 F (37.1 C) 97.5 F (36.4 C) 98.4 F (36.9 C) 98.8 F (37.1 C)  TempSrc: Oral Axillary Oral   Resp: 18 21 19 18   Height:      Weight:      SpO2: 98% 100% 96% 94%    Neurologically intact Neurovascular intact Sensation intact distally Intact pulses distally Dorsiflexion/Plantar flexion intact Incision: dressing C/D/I and no drainage No cellulitis present Compartment soft   Lab Results  Component Value Date   WBC 9.6 11/30/2014   HGB 9.0* 11/30/2014   HCT 28.5* 11/30/2014   MCV 97.9 11/30/2014   PLT 157 11/30/2014     Assessment/Plan:  1 Day Post-Op   - Expected postop acute blood loss anemia - will monitor for symptoms - Up with PT/OT - DVT ppx - SCDs, ambulation, lovenox - WBAT left lower extremity   Cheral AlmasXu, Naiping Michael 11/30/2014, 3:08 PM [phone]

## 2014-11-30 NOTE — Progress Notes (Signed)
PROGRESS NOTE  Jennifer ButcherJoann M Larmon EAV:[id number] DOB: 1929/07/09 DOA: 11/28/2014 PCP: Rogelia BogaKWIATKOWSKI,PETER FRANK, MD  HPI/Recap of past 24 hours:  POD#1, reported less pain, nausea resolved, does has sore throat but better, no family member in room.  Assessment/Plan: Principal Problem:   Closed comminuted intertrochanteric fracture of proximal end of left femur Active Problems:   Falls frequently   Hypothyroidism   Anxiety   Dementia   Restless leg syndrome   Essential hypertension   Osteoporosis   TIA (transient ischemic attack)   Fall due to stumbling   Chronic renal failure   Closed comminuted intertrochanteric fracture of proximal femur  Closed comminuted and intratrochanteric left femur fracture -POD#1  -appreciate ortho input.  Hypothyroidism -TSH 1.8 -reported not taking Synthroid 50 g daily.  Essential hypertension -bp low normal, hold patient's BP medication at this time  Anxiety -Prozac 40 mg daily  Dementia -Namenda 10 mg BID -Aricept 5 mg QHS  Acute on Chronic renal failure (baseline 1.71) -Normal saline 1075ml/hr -Foley catheter -cr worse than that from 2015, new baseline? UA with bacteruria, urine culture pending, no overt sign of infection, will hold of abx.   Osteoporosis -Vitamin D 25  Fall due to stumbling -Patient to be evaluated post surgery by PT/OT for CIR vs SNF     Code Status: full  Family Communication: patient and family  Disposition Plan: likely SNF   Consultants:  ortho  Procedures: Treatment of intertrochanteric fracture with intramedullary implant 5/16  Antibiotics:  none   Objective: BP 111/52 mmHg  Pulse 96  Temp(Src) 98.8 F (37.1 C) (Oral)  Resp 18  Ht 5\' 6"  (1.676 m)  Wt 70.761 kg (156 lb)  BMI 25.19 kg/m2  SpO2 94%  Intake/Output Summary (Last 24 hours) at 11/30/14 2035 Last data filed at 11/30/14 1300  Gross per 24 hour  Intake   1563 ml  Output    300 ml  Net   1263 ml   Filed Weights    11/28/14 1632  Weight: 70.761 kg (156 lb)    Exam:   General:  NAD, frail elderly laying in bed.  Cardiovascular: RRR  Respiratory: CTABL  Abdomen: Soft/ND/NT, positive BS  Musculoskeletal: post op changes left hip  Neuro: baseline dementia. Calm and pleasant.  Data Reviewed: Basic Metabolic Panel:  Recent Labs Lab 11/28/14 1728 11/29/14 0544 11/30/14 0510  NA 139 141 139  K 4.7 4.3 4.5  CL 108 111 112*  CO2 21* 22 21*  GLUCOSE 108* 106* 125*  BUN 22* 26* 30*  CREATININE 1.91* 2.03* 2.19*  CALCIUM 9.2 8.8* 8.3*   Liver Function Tests:  Recent Labs Lab 11/28/14 1728 11/29/14 0544  AST 20 18  ALT 12* 12*  ALKPHOS 109 97  BILITOT 0.3 0.7  PROT 6.6 5.8*  ALBUMIN 3.2* 2.7*   No results for input(s): LIPASE, AMYLASE in the last 168 hours. No results for input(s): AMMONIA in the last 168 hours. CBC:  Recent Labs Lab 11/28/14 1728 11/29/14 0544 11/30/14 0510  WBC 9.3 10.1 9.6  NEUTROABS 6.5 7.2  --   HGB 12.3 10.8* 9.0*  HCT 38.5 34.0* 28.5*  MCV 96.0 95.2 97.9  PLT 265 203 157   Cardiac Enzymes:   No results for input(s): CKTOTAL, CKMB, CKMBINDEX, TROPONINI in the last 168 hours. BNP (last 3 results) No results for input(s): BNP in the last 8760 hours.  ProBNP (last 3 results) No results for input(s): PROBNP in the last 8760 hours.  CBG: No results  for input(s): GLUCAP in the last 168 hours.  Recent Results (from the past 240 hour(s))  Surgical pcr screen     Status: None   Collection Time: 11/29/14 10:14 AM  Result Value Ref Range Status   MRSA, PCR NEGATIVE NEGATIVE Final   Staphylococcus aureus NEGATIVE NEGATIVE Final    Comment:        The Xpert SA Assay (FDA approved for NASAL specimens in patients over 79 years of age), is one component of a comprehensive surveillance program.  Test performance has been validated by [facility] for patients greater than or equal to 79 year old. It is not intended to diagnose infection nor  to guide or monitor treatment.      Studies: Dg C-arm 1-60 Min  11/29/2014   CLINICAL DATA:  Left hip IM nail  EXAM: DG C-ARM 61-120 MIN; LEFT FEMUR 2 VIEWS  FLUOROSCOPY TIME:  Fluoroscopy Time (in minutes and seconds): 1 minutes 32 seconds  Number of Acquired Images:  4  COMPARISON:  11/28/2014  FINDINGS: Intraoperative fluoroscopic spot images during IM nail with dynamic hip screw fixation of an intertrochanteric left hip fracture.  Minimally displaced lesser trochanteric fragment.  IMPRESSION: Intraoperative fluoroscopic spot images during ORIF of a left hip fracture, as above.   Electronically Signed   By: Charline BillsSriyesh  Krishnan M.D.   On: 11/29/2014 15:35   Dg Femur Min 2 Views Left  11/29/2014   CLINICAL DATA:  Left hip IM nail  EXAM: DG C-ARM 61-120 MIN; LEFT FEMUR 2 VIEWS  FLUOROSCOPY TIME:  Fluoroscopy Time (in minutes and seconds): 1 minutes 32 seconds  Number of Acquired Images:  4  COMPARISON:  11/28/2014  FINDINGS: Intraoperative fluoroscopic spot images during IM nail with dynamic hip screw fixation of an intertrochanteric left hip fracture.  Minimally displaced lesser trochanteric fragment.  IMPRESSION: Intraoperative fluoroscopic spot images during ORIF of a left hip fracture, as above.   Electronically Signed   By: Charline BillsSriyesh  Krishnan M.D.   On: 11/29/2014 15:35    Scheduled Meds: . sodium chloride   Intravenous Once  . aspirin EC  81 mg Oral Daily  . calcium-vitamin D  1 tablet Oral Daily  . docusate sodium  100 mg Oral BID  . donepezil  5 mg Oral QHS  . enoxaparin (LOVENOX) injection  30 mg Subcutaneous Q24H  . FLUoxetine  40 mg Oral Daily  . lactose free nutrition  237 mL Oral BID BM  . levothyroxine  50 mcg Oral QAC breakfast  . memantine  10 mg Oral BID  . pantoprazole  40 mg Oral Daily    Continuous Infusions: . sodium chloride 10 mL/hr at 11/29/14 1258  . sodium chloride 125 mL/hr at 11/30/14 [id number]     Time spent: 25mins  Anny Sayler MD, PhD  [facility] Pager  [phone]. If 7PM-7AM, please contact night-coverage at [email or web address], password [email or web address] 11/30/2014, 8:35 PM  LOS: 2 days

## 2014-12-01 ENCOUNTER — Encounter (HOSPITAL_COMMUNITY): Payer: Self-pay | Admitting: General Practice

## 2014-12-01 DIAGNOSIS — N3000 Acute cystitis without hematuria: Secondary | ICD-10-CM

## 2014-12-01 LAB — VITAMIN D 25 HYDROXY (VIT D DEFICIENCY, FRACTURES): VIT D 25 HYDROXY: 76.1 ng/mL (ref 30.0–100.0)

## 2014-12-01 LAB — BASIC METABOLIC PANEL
Anion gap: 8 (ref 5–15)
BUN: 33 mg/dL — ABNORMAL HIGH (ref 6–20)
CALCIUM: 8.7 mg/dL — AB (ref 8.9–10.3)
CHLORIDE: 108 mmol/L (ref 101–111)
CO2: 20 mmol/L — AB (ref 22–32)
Creatinine, Ser: 2.05 mg/dL — ABNORMAL HIGH (ref 0.44–1.00)
GFR calc Af Amer: 25 mL/min — ABNORMAL LOW (ref >60)
GFR calc non Af Amer: 21 mL/min — ABNORMAL LOW (ref >60)
GLUCOSE: 109 mg/dL — AB (ref 65–99)
Potassium: 4.5 mmol/L (ref 3.5–5.1)
SODIUM: 136 mmol/L (ref 135–145)

## 2014-12-01 LAB — CBC
HEMATOCRIT: 24.9 % — AB (ref 36.0–46.0)
HEMOGLOBIN: 8 g/dL — AB (ref 12.0–15.0)
MCH: 31.3 pg (ref 26.0–34.0)
MCHC: 32.1 g/dL (ref 30.0–36.0)
MCV: 97.3 fL (ref 78.0–100.0)
Platelets: 140 10*3/uL — ABNORMAL LOW (ref 150–400)
RBC: 2.56 MIL/uL — ABNORMAL LOW (ref 3.87–5.11)
RDW: 14.6 % (ref 11.5–15.5)
WBC: 9.4 10*3/uL (ref 4.0–10.5)

## 2014-12-01 MED ORDER — CEFTRIAXONE SODIUM IN DEXTROSE 20 MG/ML IV SOLN
1.0000 g | INTRAVENOUS | Status: DC
  Administered 2014-12-01 – 2014-12-02 (×2): 1 g via INTRAVENOUS
  Filled 2014-12-01 (×3): qty 50

## 2014-12-01 NOTE — Progress Notes (Signed)
PROGRESS NOTE  Jennifer [name] [id number] DOB: 11-20-1928 DOA: 11/28/2014 PCP: Rogelia BogaKWIATKOWSKI,PETER FRANK, MD  HPI: Jennifer ButcherJoann M [name] is a 81 y.o.WF PMHx anxiety, dementia, restless leg syndrome, hypertension, hypothyroidism, osteoporosis, multiple TIAs, and mild dementia who presents with a fall, found to have left femur fx  Subjective / 24 H Interval events - no complaints, specifically denies chest pain, abdominal pain, denies shortness of breath   Assessment/Plan: Principal Problem:   Closed comminuted intertrochanteric fracture of proximal end of left femur Active Problems:   Falls frequently   Hypothyroidism   Anxiety   Dementia   Restless leg syndrome   Essential hypertension   Osteoporosis   TIA (transient ischemic attack)   Fall due to stumbling   Chronic renal failure   Closed comminuted intertrochanteric fracture of proximal femur   Closed comminuted and intratrochanteric left femur fracture - POD#2 - appreciate ortho input. - SNF placement pending  Hypothyroidism - TSH 1.8 - reported not taking Synthroid 50 g daily.  Essential hypertension - bp low normal, hold patient's BP medication at this time  Anxiety - Prozac 40 mg daily  Dementia - Namenda 10 mg BID - Aricept 5 mg QHS  Acute on Chronic renal failure (baseline 1.71) - Normal saline 1775ml/hr - Foley catheter - cr worse than that from 2015, new baseline?  - UA with bacteruria, urine culture with GNR - given low grade temp will start Ceftriaxone  Osteoporosis - Vitamin D 25  Fall due to stumbling - SNF   Diet: Diet regular Room service appropriate?: Yes; Fluid consistency:: Thin Fluids: none DVT Prophylaxis: Lovenox  Code Status: Full Code Family Communication: d/w son bedside  Disposition Plan: SNF 1-2 days   Consultants:  Orthopedic surgery   Procedures:  Treatment of intertrochanteric fracture with intramedullary implant 5/16   Antibiotics  Anti-infectives    Start      Dose/Rate Route Frequency Ordered Stop   12/01/14 1345  cefTRIAXone (ROCEPHIN) 1 g in dextrose 5 % 50 mL IVPB - Premix     1 g 100 mL/hr over 30 Minutes Intravenous Every 24 hours 12/01/14 1333     11/30/14 0230  ceFAZolin (ANCEF) IVPB 2 g/50 mL premix  Status:  Discontinued     2 g 100 mL/hr over 30 Minutes Intravenous Every 12 hours 11/29/14 1620 11/29/14 1700   11/30/14 0230  ceFAZolin (ANCEF) IVPB 1 g/50 mL premix     1 g 100 mL/hr over 30 Minutes Intravenous Every 12 hours 11/29/14 1700 11/30/14 0240       Studies  Dg C-arm 1-60 Min  11/29/2014   CLINICAL DATA:  Left hip IM nail  EXAM: DG C-ARM 61-120 MIN; LEFT FEMUR 2 VIEWS  FLUOROSCOPY TIME:  Fluoroscopy Time (in minutes and seconds): 1 minutes 32 seconds  Number of Acquired Images:  4  COMPARISON:  11/28/2014  FINDINGS: Intraoperative fluoroscopic spot images during IM nail with dynamic hip screw fixation of an intertrochanteric left hip fracture.  Minimally displaced lesser trochanteric fragment.  IMPRESSION: Intraoperative fluoroscopic spot images during ORIF of a left hip fracture, as above.   Electronically Signed   By: Charline BillsSriyesh  Krishnan M.D.   On: 11/29/2014 15:35   Dg Femur Min 2 Views Left  11/29/2014   CLINICAL DATA:  Left hip IM nail  EXAM: DG C-ARM 61-120 MIN; LEFT FEMUR 2 VIEWS  FLUOROSCOPY TIME:  Fluoroscopy Time (in minutes and seconds): 1 minutes 32 seconds  Number of Acquired Images:  4  COMPARISON:  11/28/2014  FINDINGS: Intraoperative fluoroscopic spot images during IM nail with dynamic hip screw fixation of an intertrochanteric left hip fracture.  Minimally displaced lesser trochanteric fragment.  IMPRESSION: Intraoperative fluoroscopic spot images during ORIF of a left hip fracture, as above.   Electronically Signed   By: Charline BillsSriyesh  Krishnan M.D.   On: 11/29/2014 15:35    Objective  Filed Vitals:   11/30/14 0518 11/30/14 1300 11/30/14 2100 12/01/14 0533  BP: 104/51 111/52 82/57 95/45   Pulse: 91 96 96 92  Temp:  98.4 F (36.9 C) 98.8 F (37.1 C) 99.7 F (37.6 C) 100 F (37.8 C)  TempSrc: Oral     Resp: 19 18 18 18   Height:      Weight:      SpO2: 96% 94% 91% 98%    Intake/Output Summary (Last 24 hours) at 12/01/14 1330 Last data filed at 12/01/14 1226  Gross per 24 hour  Intake    240 ml  Output      0 ml  Net    240 ml   Filed Weights   11/28/14 1632  Weight: 70.761 kg (156 lb)    Exam:  General:  NAD, pleasantly confused  HEENT: no scleral icterus, PERRL  Cardiovascular: RRR without MRG, 2+ peripheral pulses, no edema  Respiratory: CTA biL, good air movement, no wheezing, no crackles, no rales  Abdomen: soft, non tender, BS +, no guarding  MSK/Extremities: no clubbing/cyanosis, no joint swelling  Skin: no rashes  Neuro: non focal  Data Reviewed: Basic Metabolic Panel:  Recent Labs Lab 11/28/14 1728 11/29/14 0544 11/30/14 0510 12/01/14 0520  NA 139 141 139 136  K 4.7 4.3 4.5 4.5  CL 108 111 112* 108  CO2 21* 22 21* 20*  GLUCOSE 108* 106* 125* 109*  BUN 22* 26* 30* 33*  CREATININE 1.91* 2.03* 2.19* 2.05*  CALCIUM 9.2 8.8* 8.3* 8.7*   Liver Function Tests:  Recent Labs Lab 11/28/14 1728 11/29/14 0544  AST 20 18  ALT 12* 12*  ALKPHOS 109 97  BILITOT 0.3 0.7  PROT 6.6 5.8*  ALBUMIN 3.2* 2.7*   CBC:  Recent Labs Lab 11/28/14 1728 11/29/14 0544 11/30/14 0510 12/01/14 0520  WBC 9.3 10.1 9.6 9.4  NEUTROABS 6.5 7.2  --   --   HGB 12.3 10.8* 9.0* 8.0*  HCT 38.5 34.0* 28.5* 24.9*  MCV 96.0 95.2 97.9 97.3  PLT 265 203 157 140*    Recent Results (from the past 240 hour(s))  Culture, Urine     Status: None (Preliminary result)   Collection Time: 11/28/14  9:12 PM  Result Value Ref Range Status   Specimen Description URINE, RANDOM  Final   Special Requests ADDED 2246 11/29/14  Final   Colony Count   Final    >=100,000 COLONIES/ML Performed at [facility]    Culture   Final    GRAM NEGATIVE RODS Performed at [facility]      Report Status PENDING  Incomplete  Surgical pcr screen     Status: None   Collection Time: 11/29/14 10:14 AM  Result Value Ref Range Status   MRSA, PCR NEGATIVE NEGATIVE Final   Staphylococcus aureus NEGATIVE NEGATIVE Final    Comment:        The Xpert SA Assay (FDA approved for NASAL specimens in patients over 421 years of age), is one component of a comprehensive surveillance program.  Test performance has been validated by [facility] for patients greater than or equal  to 19 year old. It is not intended to diagnose infection nor to guide or monitor treatment.      Scheduled Meds: . sodium chloride   Intravenous Once  . aspirin EC  81 mg Oral Daily  . calcium-vitamin D  1 tablet Oral Daily  . docusate sodium  100 mg Oral BID  . donepezil  5 mg Oral QHS  . enoxaparin (LOVENOX) injection  30 mg Subcutaneous Q24H  . FLUoxetine  40 mg Oral Daily  . lactose free nutrition  237 mL Oral BID BM  . levothyroxine  50 mcg Oral QAC breakfast  . memantine  10 mg Oral BID  . pantoprazole  40 mg Oral Daily   Continuous Infusions: . sodium chloride 10 mL/hr at 11/29/14 1258  . sodium chloride 125 mL/hr at 11/30/14 1610   Time spent: 25 minutes  Pamella Pert, MD [facility] Pager [phone]. If 7 PM - 7 AM, please contact night-coverage at [email or web address], password [facility] 12/01/2014, 1:30 PM  LOS: 3 days

## 2014-12-01 NOTE — Care Management Note (Signed)
Case Management Note  Patient Details  Name: Jennifer ButcherJoann M Fifer MRN: [id number] Date of Birth: Sep 01, 1928  Subjective/Objective:     81 yr old female admitted s/p fall with a left hip fracture. Patient underwent a left hip IM Nailing.             Action/Plan:  [facility]. Social worker is aware.                              Expected Discharge Date: 12/02/14                 Expected Discharge Plan:  Skilled Nursing Facility  In-House Referral:  Clinical Social Work  Discharge planning Services  CM Consult  Post Acute Care Choice:  NA Choice offered to:  NA  DME Arranged:    DME Agency:     HH Arranged:    HH Agency:  NA  Status of Service:  Completed, signed off  Medicare Important Message Given: Yes    Date Medicare IM Given: 12/01/14   Medicare IM give by: [id number]   Date Additional Medicare IM Given:    Additional Medicare Important Message give by:     If discussed at Long Length of Stay Meetings, dates discussed:    Additional Comments:  Durenda GuthrieBrady, Kyle Luppino Naomi, RN 12/01/2014, 1:58 PM

## 2014-12-01 NOTE — Progress Notes (Signed)
Physical Therapy Treatment Patient Details Name: Jennifer [name] MRN: [id number] DOB: Apr 12, 1929 Today's Date: 12/01/2014    History of Present Illness Pt is a 81 y/o F s/p fall and L hip IM nail.  Pt's PMH includes aMS, anemia, anxiety, HTN, LBP, osteoporosis,r estless leg syndrome, weakness, vascular dementia w/ delusions, laminectomy, and R knee arthroscopy.    PT Comments    Pt appears to be limited by fear of falling and anxiety.  Pt verbalizes willingness to try to stand up but upon squat pt refuses to WB through BLEs.  Pt has h/o falls and is reason for pt's admission. Pt required total assist +2 to achieve squat.  Pt will continue to benefit from skilled PT w/ emphasis on therapeutic exercise and bed mobility to increase functionality.   Follow Up Recommendations  SNF;Supervision/Assistance - 24 hour     Equipment Recommendations   (TBD by next venue of care)    Recommendations for Other Services       Precautions / Restrictions Precautions Precautions: Fall Precaution Comments: admitted s/p fall Restrictions Weight Bearing Restrictions: Yes LLE Weight Bearing: Weight bearing as tolerated    Mobility  Bed Mobility Overal bed mobility: Needs Assistance Bed Mobility: Supine to Sit     Supine to sit: Mod assist;+2 for physical assistance;HOB elevated     General bed mobility comments: Mod assist for managing BLEs to sitting EOB and supporting trunk posteriorly.  Verbal and tactile cues for hand placement on bed rail to assist w/ pulling to sitting position.  Use of bed pad to scoot pt's hips to sitting EOB.  Transfers Overall transfer level: Needs assistance Equipment used:  (+2 physical assist) Transfers: Sit to/from Stand Sit to Stand: Total assist;+2 physical assistance;From elevated surface         General transfer comment: Sit>stand attempted x2.  Pt did not come to complete stand.  Total assist to achieve power up to squat at which point pt refused to put  weight through BLE's 2/2 pain and pt was returned to sitting EOB.    Ambulation/Gait                 Stairs            Wheelchair Mobility    Modified Rankin (Stroke Patients Only)       Balance Overall balance assessment: Needs assistance Sitting-balance support: Single extremity supported;Feet supported Sitting balance-Leahy Scale: Fair Sitting balance - Comments: Pt able to sit EOB w/ one UE supported for 3 minutes.  Postural control: Posterior lean Standing balance support: Bilateral upper extremity supported;During functional activity Standing balance-Leahy Scale: Zero                      Cognition Arousal/Alertness: Awake/alert Behavior During Therapy: WFL for tasks assessed/performed Overall Cognitive Status: History of cognitive impairments - at baseline                      Exercises Total Joint Exercises Ankle Circles/Pumps: AROM;Both;10 reps;Supine Quad Sets: AROM;Both;10 reps;Supine Heel Slides: AAROM;Left;10 reps;Supine Hip ABduction/ADduction: AAROM;Left;Supine;10 reps Long Arc Quad: AROM;Left;10 reps;Seated    General Comments        Pertinent Vitals/Pain Pain Assessment: Faces Faces Pain Scale: Hurts whole lot Pain Location: L hip Pain Descriptors / Indicators: Aching;Sharp Pain Intervention(s): Limited activity within patient's tolerance;Monitored during session;Repositioned    Home Living  Prior Function            PT Goals (current goals can now be found in the care plan section) Acute Rehab PT Goals Patient Stated Goal: to have pain go away Progress towards PT goals: Not progressing toward goals - comment (Pt appears to be limited mainly by fear of falling)    Frequency  Min 5X/week    PT Plan Current plan remains appropriate    Co-evaluation             End of Session Equipment Utilized During Treatment: Gait belt Activity Tolerance: Patient limited by pain;Other  (comment) (limited by fear of falling and anxiety) Patient left: in bed;with call bell/phone within reach;with family/visitor present;with SCD's reapplied     Time: [id number] PT Time Calculation (min) (ACUTE ONLY): 20 min  Charges:  $Therapeutic Exercise: 8-22 mins                    G Codes:      Michail JewelsAshley Parr PT, [place]PT [phone] Pager: [phone] 12/01/2014, 1:59 PM

## 2014-12-01 NOTE — Progress Notes (Signed)
   Subjective:  Patient reports pain as mild.    Objective:   VITALS:   Filed Vitals:   11/30/14 0518 11/30/14 1300 11/30/14 2100 12/01/14 0533  BP: 104/51 111/52 82/57 95/45   Pulse: 91 96 96 92  Temp: 98.4 F (36.9 C) 98.8 F (37.1 C) 99.7 F (37.6 C) 100 F (37.8 C)  TempSrc: Oral     Resp: 19 18 18 18   Height:      Weight:      SpO2: 96% 94% 91% 98%    Neurologically intact Neurovascular intact Sensation intact distally Intact pulses distally Dorsiflexion/Plantar flexion intact Incision: dressing C/D/I and no drainage No cellulitis present Compartment soft   Lab Results  Component Value Date   WBC 9.4 12/01/2014   HGB 8.0* 12/01/2014   HCT 24.9* 12/01/2014   MCV 97.3 12/01/2014   PLT 140* 12/01/2014     Assessment/Plan:  2 Days Post-Op   - Hgb 8.0, needs repeat CBC in am, if stable then can discharge - continue PT    Cheral AlmasXu, Naiping Michael 12/01/2014, 7:41 AM [phone]

## 2014-12-02 DIAGNOSIS — D62 Acute posthemorrhagic anemia: Secondary | ICD-10-CM

## 2014-12-02 LAB — URINE CULTURE: Colony Count: >=100000

## 2014-12-02 LAB — CBC
HCT: 22.4 % — ABNORMAL LOW (ref 36.0–46.0)
Hemoglobin: 7.2 g/dL — ABNORMAL LOW (ref 12.0–15.0)
MCH: 30.9 pg (ref 26.0–34.0)
MCHC: 32.1 g/dL (ref 30.0–36.0)
MCV: 96.1 fL (ref 78.0–100.0)
Platelets: 156 10*3/uL (ref 150–400)
RBC: 2.33 MIL/uL — ABNORMAL LOW (ref 3.87–5.11)
RDW: 14.3 % (ref 11.5–15.5)
WBC: 7.6 10*3/uL (ref 4.0–10.5)

## 2014-12-02 LAB — PREPARE RBC (CROSSMATCH)

## 2014-12-02 MED ORDER — SODIUM CHLORIDE 0.9 % IV SOLN: Freq: Once | INTRAVENOUS | Status: DC

## 2014-12-02 NOTE — Clinical Documentation Improvement (Signed)
  Risk Factors:  5/19 progress note:  UTI - UA with bacteruria, given low grade temp have started Ceftriaxone - speciated to Klebsiella, will narrow Abx on discharge to likely Keflex Acute on Chronic renal failure (baseline 1.71) - Normal saline 5175ml/hr - Foley catheter  Treatment: IV antibiotics After study, please clarify UTI with more specificity in progress notes and discharge summary.  A cause and effect relationship may not be assumed and must be documented by a provider. Please clarify the relationship, if any, between Foley cath & UTI?  Are the conditions: ? Due to or associated with each other ? Other (please specify) ___________________ ? Unrelated to each other ? Unable to determine ? Unknown    Thank you, Manas Hickling T. Luiz OchoaWilliams RN, MSN, MBA/MHA Clinical Documentation Specialist [email or web address] Office # [phone]

## 2014-12-02 NOTE — Progress Notes (Signed)
PROGRESS NOTE  Jennifer [name] [id number] DOB: 10-20-1928 DOA: 11/28/2014 PCP: Rogelia BogaKWIATKOWSKI,PETER FRANK, MD  HPI: Jennifer ButcherJoann M Alperin is a 81 y.o.WF PMHx anxiety, dementia, restless leg syndrome, hypertension, hypothyroidism, osteoporosis, multiple TIAs, and mild dementia who presents with a fall, found to have left femur fx  Subjective / 24 H Interval events - no complaints today, mild confusion - no chest pain, shortness of breath, no abdominal pain, nausea or vomiting.    Assessment/Plan: Principal Problem:   Closed comminuted intertrochanteric fracture of proximal end of left femur Active Problems:   Falls frequently   Hypothyroidism   Anxiety   Dementia   Restless leg syndrome   Essential hypertension   Osteoporosis   TIA (transient ischemic attack)   Fall due to stumbling   Chronic renal failure   Closed comminuted intertrochanteric fracture of proximal femur   Closed comminuted and intratrochanteric left femur fracture - POD#3 - appreciate ortho input. - SNF placement pending  ABLA - likely post op, Hb 7.2 from 10.8 3 days ago - transfuse 1U today  UTI - UA with bacteruria, given low grade temp have started Ceftriaxone - speciated to Klebsiella, will narrow Abx on discharge to likely Keflex  Hypothyroidism - TSH 1.8 - reported not taking Synthroid 50 g daily.  Essential hypertension - bp low normal, hold patient's BP medication at this time  Anxiety - Prozac 40 mg daily  Dementia - Namenda 10 mg BID - Aricept 5 mg QHS  Acute on Chronic renal failure (baseline 1.71) - Normal saline 975ml/hr - Foley catheter - cr worse than that from 2015, new baseline?   Osteoporosis - Vitamin D 25  Fall due to stumbling - SNF   Diet: Diet regular Room service appropriate?: Yes; Fluid consistency:: Thin Fluids: none DVT Prophylaxis: Lovenox  Code Status: Full Code Family Communication: d/w daughter bedside  Disposition Plan: SNF 1 day pending CBC  stability   Consultants:  Orthopedic surgery   Procedures:  Treatment of intertrochanteric fracture with intramedullary implant 5/16   Antibiotics  Anti-infectives    Start     Dose/Rate Route Frequency Ordered Stop   12/01/14 1500  cefTRIAXone (ROCEPHIN) 1 g in dextrose 5 % 50 mL IVPB - Premix     1 g 100 mL/hr over 30 Minutes Intravenous Every 24 hours 12/01/14 1333     11/30/14 0230  ceFAZolin (ANCEF) IVPB 2 g/50 mL premix  Status:  Discontinued     2 g 100 mL/hr over 30 Minutes Intravenous Every 12 hours 11/29/14 1620 11/29/14 1700   11/30/14 0230  ceFAZolin (ANCEF) IVPB 1 g/50 mL premix     1 g 100 mL/hr over 30 Minutes Intravenous Every 12 hours 11/29/14 1700 11/30/14 0240       Studies  No results found.  Objective  Filed Vitals:   12/01/14 0533 12/01/14 1422 12/01/14 2100 12/02/14 0600  BP: 95/45 137/49 119/51 141/62  Pulse: 92 91 93 105  Temp: 100 F (37.8 C) 99 F (37.2 C) 98.7 F (37.1 C) 99.3 F (37.4 C)  TempSrc:  Oral Oral   Resp: 18 18 17 16   Height:      Weight:      SpO2: 98% 91% 97% 97%    Intake/Output Summary (Last 24 hours) at 12/02/14 1052 Last data filed at 12/02/14 0900  Gross per 24 hour  Intake    360 ml  Output      0 ml  Net    360 ml  Filed Weights   11/28/14 1632  Weight: 70.761 kg (156 lb)   Exam:  General:  NAD, pleasantly confused  HEENT: no scleral icterus, PERRL  Cardiovascular: RRR without MRG, 2+ peripheral pulses, no edema  Respiratory: CTA biL, good air movement, no wheezing  Abdomen: soft, non tender, BS +, no guarding  MSK/Extremities: no clubbing/cyanosis, no joint swelling  Skin: no rashes  Neuro: non focal  Data Reviewed: Basic Metabolic Panel:  Recent Labs Lab 11/28/14 1728 11/29/14 0544 11/30/14 0510 12/01/14 0520  NA 139 141 139 136  K 4.7 4.3 4.5 4.5  CL 108 111 112* 108  CO2 21* 22 21* 20*  GLUCOSE 108* 106* 125* 109*  BUN 22* 26* 30* 33*  CREATININE 1.91* 2.03* 2.19* 2.05*    CALCIUM 9.2 8.8* 8.3* 8.7*   Liver Function Tests:  Recent Labs Lab 11/28/14 1728 11/29/14 0544  AST 20 18  ALT 12* 12*  ALKPHOS 109 97  BILITOT 0.3 0.7  PROT 6.6 5.8*  ALBUMIN 3.2* 2.7*   CBC:  Recent Labs Lab 11/28/14 1728 11/29/14 0544 11/30/14 0510 12/01/14 0520 12/02/14 0540  WBC 9.3 10.1 9.6 9.4 7.6  NEUTROABS 6.5 7.2  --   --   --   HGB 12.3 10.8* 9.0* 8.0* 7.2*  HCT 38.5 34.0* 28.5* 24.9* 22.4*  MCV 96.0 95.2 97.9 97.3 96.1  PLT 265 203 157 140* 156    Recent Results (from the past 240 hour(s))  Culture, Urine     Status: None   Collection Time: 11/28/14  9:12 PM  Result Value Ref Range Status   Specimen Description URINE, RANDOM  Final   Special Requests ADDED 2246 11/29/14  Final   Colony Count   Final    >=100,000 COLONIES/ML Performed at [facility]    Culture   Final    KLEBSIELLA PNEUMONIAE Performed at [facility]    Report Status 12/02/2014 FINAL  Final   Organism ID, Bacteria KLEBSIELLA PNEUMONIAE  Final      Susceptibility   Klebsiella pneumoniae - MIC*    AMPICILLIN >=32 RESISTANT Resistant     CEFAZOLIN <=4 SENSITIVE Sensitive     CEFTRIAXONE <=1 SENSITIVE Sensitive     CIPROFLOXACIN <=0.25 SENSITIVE Sensitive     GENTAMICIN <=1 SENSITIVE Sensitive     LEVOFLOXACIN <=0.12 SENSITIVE Sensitive     NITROFURANTOIN 32 SENSITIVE Sensitive     TOBRAMYCIN <=1 SENSITIVE Sensitive     TRIMETH/SULFA <=20 SENSITIVE Sensitive     PIP/TAZO <=4 SENSITIVE Sensitive     * KLEBSIELLA PNEUMONIAE  Surgical pcr screen     Status: None   Collection Time: 11/29/14 10:14 AM  Result Value Ref Range Status   MRSA, PCR NEGATIVE NEGATIVE Final   Staphylococcus aureus NEGATIVE NEGATIVE Final    Comment:        The Xpert SA Assay (FDA approved for NASAL specimens in patients over 821 years of age), is one component of a comprehensive surveillance program.  Test performance has been validated by [facility] for patients greater than or  equal to 79 year old. It is not intended to diagnose infection nor to guide or monitor treatment.      Scheduled Meds: . sodium chloride   Intravenous Once  . sodium chloride   Intravenous Once  . aspirin EC  81 mg Oral Daily  . calcium-vitamin D  1 tablet Oral Daily  . cefTRIAXone (ROCEPHIN)  IV  1 g Intravenous Q24H  . docusate sodium  100 mg Oral BID  . donepezil  5 mg Oral QHS  . enoxaparin (LOVENOX) injection  30 mg Subcutaneous Q24H  . FLUoxetine  40 mg Oral Daily  . lactose free nutrition  237 mL Oral BID BM  . levothyroxine  50 mcg Oral QAC breakfast  . memantine  10 mg Oral BID  . pantoprazole  40 mg Oral Daily   Continuous Infusions: . sodium chloride 10 mL/hr at 11/29/14 1258   Time spent: 25 minutes, 50% bedside  Pamella Pert, MD [facility] Pager [phone]. If 7 PM - 7 AM, please contact night-coverage at [email or web address], password [facility] 12/02/2014, 10:52 AM  LOS: 4 days

## 2014-12-02 NOTE — Progress Notes (Signed)
Patient is stable Hgb pending Dressing c/d/i SNF pending  [name] [name], MD [facility] [phone] 7:41 AM

## 2014-12-02 NOTE — Progress Notes (Signed)
Physical Therapy Treatment Patient Details Name: Jennifer ButcherJoann M Russi MRN: [id number] DOB: 1928/11/30 Today's Date: 12/02/2014    History of Present Illness Pt is a 81 y/o F s/p fall and L hip IM nail.  Pt's PMH includes aMS, anemia, anxiety, HTN, LBP, osteoporosis,r estless leg syndrome, weakness, vascular dementia w/ delusions, laminectomy, and R knee arthroscopy.    PT Comments    Patient agreeable to attempt moving OOB with therapy this afternoon despite pain. Patient stated that she needed bedpan and required Max assist with use of rails to roll side to side for use. Total A via squat pivot to transfer to recliner. Patient moaning throughout. NT aware of maximove pad behind patient in recliner for transfer back to bed. Continue to recommend SNF for ongoing Physical Therapy.     Follow Up Recommendations  SNF;Supervision/Assistance - 24 hour     Equipment Recommendations       Recommendations for Other Services       Precautions / Restrictions Precautions Precautions: Fall Precaution Comments: admitted s/p fall Restrictions LLE Weight Bearing: Weight bearing as tolerated    Mobility  Bed Mobility Overal bed mobility: Needs Assistance Bed Mobility: Supine to Sit;Rolling Rolling: Max assist   Supine to sit: Mod assist;+2 for physical assistance;HOB elevated     General bed mobility comments: Mod assist for managing BLEs to sitting EOB and supporting trunk posteriorly.  Verbal and tactile cues for hand placement on bed rail to assist w/ pulling to sitting position.  Use of bed pad to scoot pt's hips to sitting EOB.  Transfers Overall transfer level: Needs assistance   Transfers: Sit to/from Starwood HotelsStand;Squat Pivot Transfers Sit to Stand: Total assist;+2 physical assistance;From elevated surface   Squat pivot transfers: +2 physical assistance;Total assist     General transfer comment: Unable to come into full upright stand. Able to pivot to recliner with +2 total assist, use of  chuck pad via squat pivot. Patient not attempting to put full weight through LEs  Ambulation/Gait                 Stairs            Wheelchair Mobility    Modified Rankin (Stroke Patients Only)       Balance     Sitting balance-Leahy Scale: Fair Sitting balance - Comments: Pt able to sit EOB w/ one UE supported for 5 minutes.      Standing balance-Leahy Scale: Zero                      Cognition Arousal/Alertness: Awake/alert Behavior During Therapy: WFL for tasks assessed/performed Overall Cognitive Status: History of cognitive impairments - at baseline                      Exercises      General Comments        Pertinent Vitals/Pain Faces Pain Scale: Hurts even more Pain Location: L hip with movement Pain Descriptors / Indicators: Crying;Grimacing Pain Intervention(s): Repositioned    Home Living                      Prior Function            PT Goals (current goals can now be found in the care plan section) Progress towards PT goals: Progressing toward goals    Frequency  Min 3X/week    PT Plan Current plan remains appropriate    Co-evaluation  End of Session Equipment Utilized During Treatment: Gait belt Activity Tolerance: Patient limited by pain Patient left: in chair;with call bell/phone within reach;with chair alarm set;Other (comment) (with maximove pad adjusted under patient)     Time: [id number] PT Time Calculation (min) (ACUTE ONLY): 26 min  Charges:  $Therapeutic Activity: 23-37 mins                    G Codes:      Fredrich BirksRobinette, Rubby Barbary Elizabeth 12/02/2014, 2:31 PM 12/02/2014 Fredrich Birksobinette, Clemon Devaul Elizabeth PTA [phone] pager [phone] office

## 2014-12-03 DIAGNOSIS — S72142D Displaced intertrochanteric fracture of left femur, subsequent encounter for closed fracture with routine healing: Secondary | ICD-10-CM | POA: Diagnosis not present

## 2014-12-03 DIAGNOSIS — F419 Anxiety disorder, unspecified: Secondary | ICD-10-CM | POA: Diagnosis not present

## 2014-12-03 DIAGNOSIS — M6281 Muscle weakness (generalized): Secondary | ICD-10-CM | POA: Diagnosis not present

## 2014-12-03 DIAGNOSIS — E038 Other specified hypothyroidism: Secondary | ICD-10-CM | POA: Diagnosis not present

## 2014-12-03 DIAGNOSIS — S72002A Fracture of unspecified part of neck of left femur, initial encounter for closed fracture: Secondary | ICD-10-CM | POA: Diagnosis not present

## 2014-12-03 DIAGNOSIS — S72355D Nondisplaced comminuted fracture of shaft of left femur, subsequent encounter for closed fracture with routine healing: Secondary | ICD-10-CM | POA: Diagnosis not present

## 2014-12-03 DIAGNOSIS — R278 Other lack of coordination: Secondary | ICD-10-CM | POA: Diagnosis not present

## 2014-12-03 DIAGNOSIS — R296 Repeated falls: Secondary | ICD-10-CM | POA: Diagnosis not present

## 2014-12-03 DIAGNOSIS — M810 Age-related osteoporosis without current pathological fracture: Secondary | ICD-10-CM | POA: Diagnosis not present

## 2014-12-03 DIAGNOSIS — I10 Essential (primary) hypertension: Secondary | ICD-10-CM | POA: Diagnosis not present

## 2014-12-03 DIAGNOSIS — R262 Difficulty in walking, not elsewhere classified: Secondary | ICD-10-CM | POA: Diagnosis not present

## 2014-12-03 DIAGNOSIS — F0390 Unspecified dementia without behavioral disturbance: Secondary | ICD-10-CM | POA: Diagnosis not present

## 2014-12-03 DIAGNOSIS — Z4789 Encounter for other orthopedic aftercare: Secondary | ICD-10-CM | POA: Diagnosis not present

## 2014-12-03 DIAGNOSIS — N184 Chronic kidney disease, stage 4 (severe): Secondary | ICD-10-CM | POA: Diagnosis not present

## 2014-12-03 LAB — CBC
HCT: 25 % — ABNORMAL LOW (ref 36.0–46.0)
Hemoglobin: 8.1 g/dL — ABNORMAL LOW (ref 12.0–15.0)
MCH: 30.8 pg (ref 26.0–34.0)
MCHC: 32.4 g/dL (ref 30.0–36.0)
MCV: 95.1 fL (ref 78.0–100.0)
Platelets: 172 10*3/uL (ref 150–400)
RBC: 2.63 MIL/uL — ABNORMAL LOW (ref 3.87–5.11)
RDW: 15.4 % (ref 11.5–15.5)
WBC: 7 10*3/uL (ref 4.0–10.5)

## 2014-12-03 LAB — TYPE AND SCREEN
ABO/RH(D): O POS
ANTIBODY SCREEN: NEGATIVE
Unit division: 0

## 2014-12-03 MED ORDER — CEPHALEXIN 250 MG PO CAPS: 250.0000 mg | ORAL_CAPSULE | Freq: Two times a day (BID) | ORAL | Status: DC

## 2014-12-03 MED ORDER — HYDROCODONE-ACETAMINOPHEN 5-325 MG PO TABS: 1.0000 | ORAL_TABLET | Freq: Four times a day (QID) | ORAL | Status: DC | PRN

## 2014-12-03 NOTE — Progress Notes (Signed)
Patient is stable for discharge from ortho standpoint 

## 2014-12-03 NOTE — Discharge Planning (Signed)
[facility]. Patient's son updated regarding discharge.  [facility] RN report number: [phone] Transportation: EMS ([facility]) scheduled for 1pm due to bed readiness at facility.  Marcelline Deistmily Adedamola Seto, [place] - [phone] Clinical Social Work Department Orthopedics [phone]([id number]) and Surgical [phone]([id number])

## 2014-12-03 NOTE — Clinical Social Work Placement (Signed)
   CLINICAL SOCIAL WORK PLACEMENT  NOTE  Date:  12/03/2014  Patient Details  Name: Jennifer ButcherJoann M Hopson MRN: [id number] Date of Birth: 06-21-1929  Clinical Social Work is seeking post-discharge placement for this patient at the Skilled  Nursing Facility level of care (*CSW will initial, date and re-position this form in  chart as items are completed):  Yes   Patient/family provided with [facility] Clinical Social Work Department's list of facilities offering this level of care within the geographic area requested by the patient (or if unable, by the patient's family).  Yes   Patient/family informed of their freedom to choose among providers that offer the needed level of care, that participate in Medicare, Medicaid or managed care program needed by the patient, have an available bed and are willing to accept the patient.  Yes   Patient/family informed of [facility]'s ownership interest in [facility] and [facility], as well as of the fact that they are under no obligation to receive care at these facilities.  PASRR submitted to EDS on       PASRR number received on       Existing PASRR number confirmed on 11/30/14     FL2 transmitted to all facilities in geographic area requested by pt/family on 11/30/14     FL2 transmitted to all facilities within larger geographic area on       Patient informed that his/her managed care company has contracts with or will negotiate with certain facilities, including the following:        Yes   Patient/family informed of bed offers received.  [facility]     Physician recommends and patient chooses bed at      [facility].  Patient to be transferred to facility by [facility]     Patient family notified on 12/03/14 of transfer.  Name of family member notified:  Malachi BondsJake Denker (patient's son)     PHYSICIAN       Additional Comment:     _______________________________________________ Rod MaeVaughn, Perina Salvaggio S, LCSW 12/03/2014, 10:38 AM [phone]

## 2014-12-03 NOTE — Discharge Summary (Signed)
Physician Discharge Summary  Jennifer [name] ZOX:[id number] DOB: 02/06/1929 DOA: 11/28/2014  PCP: Rogelia BogaKWIATKOWSKI,PETER FRANK, MD  Admit date: 11/28/2014 Discharge date: 12/03/2014  Time spent: > 30 minutes  Recommendations for Outpatient Follow-up:  1. Follow up with Dr. Lesia HausenKwiatowski in 1-2 weeks 2. Follow up with Dr. Roda ShuttersXu in 2 weeks 3. Continue Lovenox prophylaxis for 30 days   4. Continue keflex for 5 additional days for UTI 5. Repeat CBC and BMP in 3-4 days   Discharge Diagnoses:  Principal Problem:   Closed comminuted intertrochanteric fracture of proximal end of left femur Active Problems:   Falls frequently   Hypothyroidism   Anxiety   Dementia   Restless leg syndrome   Essential hypertension   Osteoporosis   TIA (transient ischemic attack)   Fall due to stumbling   Chronic renal failure   Closed comminuted intertrochanteric fracture of proximal femur  Discharge Condition: stable  Diet recommendation: heart healthy  Filed Weights   11/28/14 1632  Weight: 70.761 kg (156 lb)   History of present illness:  Jennifer ButcherJoann M [name] is a 81 y.o. female  Jennifer ButcherJoann M [name] is a 81 y.o.WF PMHx anxiety, dementia, restless leg syndrome, hypertension, hypothyroidism, osteoporosis, multiple TIAs, and mild dementia who presents with a fall. The patient is currently by her family reports that the patient had a mechanical fall onto her left hip. The patient normally walks with a 4 wheeled walker. While outside in the driveway, the walker slid out from under her and she fell down landing on her left hip. The patient denied any preceding palpitations or chest pain and reports no other preceding symptoms. The patient says that she was unable to ambulate following the fall and was brought to the emergency department for further evaluation. The patient denies ever breaking a bone before. The patient denies any history of hip surgery. However states had previous shoulder surgery and right knee surgery. On  arrival, the patient does remain as an 8 out of 10 in severity located in her left hip. The pain does not radiate. The patient has no numbness or tingling in her lower extremities. On review of systems, the patient denies any fevers, chills, chest pain, shortness of breath, nausea, vomiting, constipation, diarrhea, dysuria, or rashes. The patient denies any skin damage or bleeding following the fall. The patient denies hitting her head. The patient does not take any anticoagulation medicine but does take aspirin.  Hospital Course:  Closed comminuted and intratrochanteric left femur fracture - orthopedic surgery was consulted and patient underwent treatment of intertrochanteric fracture with intramedullary implant 5/16 by Dr. Roda ShuttersXu. She did well postoperatively and after PT evaluation, SNF was recommended. She has mild post op anemia requiring a unit of pRBC on 5/19, her hemoglobin was stable afterwards. Recommend repeat CBC in 3-4 days after discharge.  UTI - UA with bacteruria, given low grade temp have started Ceftriaxone initially, this is not related to her Foley catheter as UA was obtained on admission. Urine culture speciated to Klebsiella, will narrow Abx on discharge to Keflex which she is to continue for 5 additional days, renally dosed.  Hypothyroidism - TSH 1.8 Anxiety - Prozac 40 mg daily Dementia - Namenda 10 mg BID, Aricept 5 mg QHS Acute on Chronic renal failure, stage IV - Creatinine overall stable stable, discontinue Losartan on discharge, recommend repeat BMP in 3-4 days after discharge.   Procedures:  Treatment of intertrochanteric fracture with intramedullary implant 5/16   Consultations:  Orthopedic surgery   Discharge  Exam: Filed Vitals:   12/02/14 1730 12/02/14 1755 12/02/14 1945 12/03/14 0729  BP: 107/54 107/51 142/67 135/66  Pulse: 84 93 90 94  Temp: 98 F (36.7 C) 98.3 F (36.8 C) 98.9 F (37.2 C) 99.1 F (37.3 C)  TempSrc: Oral Oral Oral Oral  Resp: 20 16 16 16     Height:      Weight:      SpO2: 96% 99% 100% 95%    General: NAD Cardiovascular: RRR Respiratory: CTA biL  Discharge Instructions  Discharge Instructions    Weight bearing as tolerated    Complete by:  As directed             Medication List    STOP taking these medications        ALEVE 220 MG tablet  Generic drug:  naproxen sodium     losartan 100 MG tablet  Commonly known as:  COZAAR      TAKE these medications        amLODipine 10 MG tablet  Commonly known as:  NORVASC  TAKE 1 TABLET BY MOUTH EVERY DAY     aspirin 81 MG tablet  Take 81 mg by mouth daily.     benzonatate 100 MG capsule  Commonly known as:  TESSALON  Take 1 capsule (100 mg total) by mouth 2 (two) times daily as needed for cough.     CALCIUM + D PO  Take 1 tablet by mouth 2 (two) times daily.     cephALEXin 250 MG capsule  Commonly known as:  KEFLEX  Take 1 capsule (250 mg total) by mouth 2 (two) times daily.     diphenhydrAMINE 25 MG tablet  Commonly known as:  BENADRYL  Take 25 mg by mouth as needed.     donepezil 5 MG tablet  Commonly known as:  ARICEPT  TAKE 1 TABLET BY MOUTH AT BEDTIME     enoxaparin 40 MG/0.4ML injection  Commonly known as:  LOVENOX  Inject 0.4 mLs (40 mg total) into the skin daily.     FLUoxetine 40 MG capsule  Commonly known as:  PROZAC  TAKE ONE CAPSULE BY MOUTH EVERY DAY     HYDROcodone-acetaminophen 5-325 MG per tablet  Commonly known as:  NORCO/VICODIN  Take 1-2 tablets by mouth every 6 (six) hours as needed for moderate pain.     levothyroxine 50 MCG tablet  Commonly known as:  SYNTHROID, LEVOTHROID  TAKE 1 TABLET BY MOUTH EVERY DAY     memantine 10 MG tablet  Commonly known as:  NAMENDA  TAKE 1 TABLET BY MOUTH TWICE A DAY     oxyCODONE 5 MG immediate release tablet  Commonly known as:  Oxy IR/ROXICODONE  Take 1-3 tablets (5-15 mg total) by mouth every 4 (four) hours as needed.     ranitidine 75 MG tablet  Commonly known as:  ZANTAC   Take 75 mg by mouth daily.           Follow-up Information    Follow up with Cheral AlmasXu, Naiping Michael, MD In 2 weeks.   Specialty:  Orthopedic Surgery   Why:  For suture removal, For wound re-check   Contact information:   [address] [place] [id number] [phone]       Follow up with Rogelia BogaKWIATKOWSKI,PETER FRANK, MD. Schedule an appointment as soon as possible for a visit in 1 week.   Specialty:  Internal Medicine   Contact information:   [phone] [address] [place]  [place] [address] [phone]       The results of significant diagnostics from this hospitalization (including imaging, microbiology, ancillary and laboratory) are listed below for reference.    Significant Diagnostic Studies: Chest Portable 1 View  11/28/2014   CLINICAL DATA:  Fall with comminuted intertrochanteric fracture the proximal left femur, preoperative.  EXAM: PORTABLE CHEST - 1 VIEW  COMPARISON:  03/19/2012  FINDINGS: Atherosclerotic calcification of the aortic arch noted. Upper normal heart size.  The lungs appear clear. Moderate degenerative glenohumeral arthropathy on the right. Distal left clavicular deformity from old fracture. Tapered undersurface of the distal right clavicle probably from resection.  IMPRESSION: 1. No acute thoracic findings. 2. Atherosclerotic aortic arch. 3. Degenerative right glenohumeral arthropathy.   Electronically Signed   By: Gaylyn Rong M.D.   On: 11/28/2014 21:10   Dg C-arm 1-60 Min  11/29/2014   CLINICAL DATA:  Left hip IM nail  EXAM: DG C-ARM 61-120 MIN; LEFT FEMUR 2 VIEWS  FLUOROSCOPY TIME:  Fluoroscopy Time (in minutes and seconds): 1 minutes 32 seconds  Number of Acquired Images:  4  COMPARISON:  11/28/2014  FINDINGS: Intraoperative fluoroscopic spot images during IM nail with dynamic hip screw fixation of an intertrochanteric left hip fracture.  Minimally displaced lesser trochanteric fragment.  IMPRESSION: Intraoperative fluoroscopic spot images during  ORIF of a left hip fracture, as above.   Electronically Signed   By: Charline Bills M.D.   On: 11/29/2014 15:35   Dg Hip Unilat With Pelvis 2-3 Views Left  11/28/2014   CLINICAL DATA:  Left hip pain with painful range of motion secondary to a fall.  EXAM: LEFT HIP (WITH PELVIS) 2-3 VIEWS  COMPARISON:  None.  FINDINGS: There is a comminuted fracture of the intertrochanteric region of the proximal left femur with avulsion of the lesser trochanter. The fracture is angulated and slightly overriding.  Diffuse osteopenia.  Evidence of previous bilateral sacral plasty.  IMPRESSION: Comminuted angulated intertrochanteric fracture of the proximal left femur. Osteopenia.   Electronically Signed   By: Francene Boyers M.D.   On: 11/28/2014 17:34   Dg Femur Min 2 Views Left  11/29/2014   CLINICAL DATA:  Left hip IM nail  EXAM: DG C-ARM 61-120 MIN; LEFT FEMUR 2 VIEWS  FLUOROSCOPY TIME:  Fluoroscopy Time (in minutes and seconds): 1 minutes 32 seconds  Number of Acquired Images:  4  COMPARISON:  11/28/2014  FINDINGS: Intraoperative fluoroscopic spot images during IM nail with dynamic hip screw fixation of an intertrochanteric left hip fracture.  Minimally displaced lesser trochanteric fragment.  IMPRESSION: Intraoperative fluoroscopic spot images during ORIF of a left hip fracture, as above.   Electronically Signed   By: Charline Bills M.D.   On: 11/29/2014 15:35    Microbiology: Recent Results (from the past 240 hour(s))  Culture, Urine     Status: None   Collection Time: 11/28/14  9:12 PM  Result Value Ref Range Status   Specimen Description URINE, RANDOM  Final   Special Requests ADDED [id number] 11/29/14  Final   Colony Count   Final    >=100,000 COLONIES/ML Performed at [facility]    Culture   Final    KLEBSIELLA PNEUMONIAE Performed at [facility]    Report Status 12/02/2014 FINAL  Final   Organism ID, Bacteria KLEBSIELLA PNEUMONIAE  Final      Susceptibility   Klebsiella pneumoniae  - MIC*    AMPICILLIN >=32 RESISTANT Resistant     CEFAZOLIN <=4 SENSITIVE Sensitive  CEFTRIAXONE <=1 SENSITIVE Sensitive     CIPROFLOXACIN <=0.25 SENSITIVE Sensitive     GENTAMICIN <=1 SENSITIVE Sensitive     LEVOFLOXACIN <=0.12 SENSITIVE Sensitive     NITROFURANTOIN 32 SENSITIVE Sensitive     TOBRAMYCIN <=1 SENSITIVE Sensitive     TRIMETH/SULFA <=20 SENSITIVE Sensitive     PIP/TAZO <=4 SENSITIVE Sensitive     * KLEBSIELLA PNEUMONIAE  Surgical pcr screen     Status: None   Collection Time: 11/29/14 10:14 AM  Result Value Ref Range Status   MRSA, PCR NEGATIVE NEGATIVE Final   Staphylococcus aureus NEGATIVE NEGATIVE Final    Comment:        The Xpert SA Assay (FDA approved for NASAL specimens in patients over 64 years of age), is one component of a comprehensive surveillance program.  Test performance has been validated by [facility] for patients greater than or equal to 68 year old. It is not intended to diagnose infection nor to guide or monitor treatment.      Labs: Basic Metabolic Panel:  Recent Labs Lab 11/28/14 1728 11/29/14 0544 11/30/14 0510 12/01/14 0520  NA 139 141 139 136  K 4.7 4.3 4.5 4.5  CL 108 111 112* 108  CO2 21* 22 21* 20*  GLUCOSE 108* 106* 125* 109*  BUN 22* 26* 30* 33*  CREATININE 1.91* 2.03* 2.19* 2.05*  CALCIUM 9.2 8.8* 8.3* 8.7*   Liver Function Tests:  Recent Labs Lab 11/28/14 1728 11/29/14 0544  AST 20 18  ALT 12* 12*  ALKPHOS 109 97  BILITOT 0.3 0.7  PROT 6.6 5.8*  ALBUMIN 3.2* 2.7*   CBC:  Recent Labs Lab 11/28/14 1728 11/29/14 0544 11/30/14 0510 12/01/14 0520 12/02/14 0540 12/03/14 0437  WBC 9.3 10.1 9.6 9.4 7.6 7.0  NEUTROABS 6.5 7.2  --   --   --   --   HGB 12.3 10.8* 9.0* 8.0* 7.2* 8.1*  HCT 38.5 34.0* 28.5* 24.9* 22.4* 25.0*  MCV 96.0 95.2 97.9 97.3 96.1 95.1  PLT 265 203 157 140* 156 172     Signed:  Yeily Link  [facility] 12/03/2014, 9:51 AM

## 2014-12-07 DIAGNOSIS — R296 Repeated falls: Secondary | ICD-10-CM | POA: Diagnosis not present

## 2014-12-07 DIAGNOSIS — M6281 Muscle weakness (generalized): Secondary | ICD-10-CM | POA: Diagnosis not present

## 2014-12-07 DIAGNOSIS — R278 Other lack of coordination: Secondary | ICD-10-CM | POA: Diagnosis not present

## 2014-12-07 DIAGNOSIS — R262 Difficulty in walking, not elsewhere classified: Secondary | ICD-10-CM | POA: Diagnosis not present

## 2014-12-07 DIAGNOSIS — S72355D Nondisplaced comminuted fracture of shaft of left femur, subsequent encounter for closed fracture with routine healing: Secondary | ICD-10-CM | POA: Diagnosis not present

## 2014-12-16 DIAGNOSIS — S72142D Displaced intertrochanteric fracture of left femur, subsequent encounter for closed fracture with routine healing: Secondary | ICD-10-CM | POA: Diagnosis not present

## 2014-12-21 ENCOUNTER — Other Ambulatory Visit: Payer: Self-pay | Admitting: Internal Medicine

## 2014-12-30 ENCOUNTER — Encounter: Payer: Self-pay | Admitting: Internal Medicine

## 2015-01-06 ENCOUNTER — Other Ambulatory Visit: Payer: Self-pay | Admitting: Internal Medicine

## 2015-01-14 DIAGNOSIS — S72355D Nondisplaced comminuted fracture of shaft of left femur, subsequent encounter for closed fracture with routine healing: Secondary | ICD-10-CM | POA: Diagnosis not present

## 2015-01-14 DIAGNOSIS — R262 Difficulty in walking, not elsewhere classified: Secondary | ICD-10-CM | POA: Diagnosis not present

## 2015-01-14 DIAGNOSIS — Z4889 Encounter for other specified surgical aftercare: Secondary | ICD-10-CM | POA: Diagnosis not present

## 2015-01-15 ENCOUNTER — Other Ambulatory Visit: Payer: Self-pay | Admitting: Internal Medicine

## 2015-01-17 DIAGNOSIS — S72355D Nondisplaced comminuted fracture of shaft of left femur, subsequent encounter for closed fracture with routine healing: Secondary | ICD-10-CM | POA: Diagnosis not present

## 2015-01-17 DIAGNOSIS — Z4889 Encounter for other specified surgical aftercare: Secondary | ICD-10-CM | POA: Diagnosis not present

## 2015-01-17 DIAGNOSIS — R262 Difficulty in walking, not elsewhere classified: Secondary | ICD-10-CM | POA: Diagnosis not present

## 2015-01-18 DIAGNOSIS — S72355D Nondisplaced comminuted fracture of shaft of left femur, subsequent encounter for closed fracture with routine healing: Secondary | ICD-10-CM | POA: Diagnosis not present

## 2015-01-18 DIAGNOSIS — Z4889 Encounter for other specified surgical aftercare: Secondary | ICD-10-CM | POA: Diagnosis not present

## 2015-01-18 DIAGNOSIS — R262 Difficulty in walking, not elsewhere classified: Secondary | ICD-10-CM | POA: Diagnosis not present

## 2015-01-20 DIAGNOSIS — S72355D Nondisplaced comminuted fracture of shaft of left femur, subsequent encounter for closed fracture with routine healing: Secondary | ICD-10-CM | POA: Diagnosis not present

## 2015-01-20 DIAGNOSIS — R262 Difficulty in walking, not elsewhere classified: Secondary | ICD-10-CM | POA: Diagnosis not present

## 2015-01-20 DIAGNOSIS — Z4889 Encounter for other specified surgical aftercare: Secondary | ICD-10-CM | POA: Diagnosis not present

## 2015-01-22 DIAGNOSIS — R262 Difficulty in walking, not elsewhere classified: Secondary | ICD-10-CM | POA: Diagnosis not present

## 2015-01-22 DIAGNOSIS — S72355D Nondisplaced comminuted fracture of shaft of left femur, subsequent encounter for closed fracture with routine healing: Secondary | ICD-10-CM | POA: Diagnosis not present

## 2015-01-22 DIAGNOSIS — Z4889 Encounter for other specified surgical aftercare: Secondary | ICD-10-CM | POA: Diagnosis not present

## 2015-01-24 DIAGNOSIS — R262 Difficulty in walking, not elsewhere classified: Secondary | ICD-10-CM | POA: Diagnosis not present

## 2015-01-24 DIAGNOSIS — Z4889 Encounter for other specified surgical aftercare: Secondary | ICD-10-CM | POA: Diagnosis not present

## 2015-01-24 DIAGNOSIS — S72355D Nondisplaced comminuted fracture of shaft of left femur, subsequent encounter for closed fracture with routine healing: Secondary | ICD-10-CM | POA: Diagnosis not present

## 2015-01-27 DIAGNOSIS — R262 Difficulty in walking, not elsewhere classified: Secondary | ICD-10-CM | POA: Diagnosis not present

## 2015-01-27 DIAGNOSIS — S72355D Nondisplaced comminuted fracture of shaft of left femur, subsequent encounter for closed fracture with routine healing: Secondary | ICD-10-CM | POA: Diagnosis not present

## 2015-01-27 DIAGNOSIS — Z4889 Encounter for other specified surgical aftercare: Secondary | ICD-10-CM | POA: Diagnosis not present

## 2015-01-31 DIAGNOSIS — Z4889 Encounter for other specified surgical aftercare: Secondary | ICD-10-CM | POA: Diagnosis not present

## 2015-01-31 DIAGNOSIS — R262 Difficulty in walking, not elsewhere classified: Secondary | ICD-10-CM | POA: Diagnosis not present

## 2015-01-31 DIAGNOSIS — S72355D Nondisplaced comminuted fracture of shaft of left femur, subsequent encounter for closed fracture with routine healing: Secondary | ICD-10-CM | POA: Diagnosis not present

## 2015-02-03 DIAGNOSIS — S72142D Displaced intertrochanteric fracture of left femur, subsequent encounter for closed fracture with routine healing: Secondary | ICD-10-CM | POA: Diagnosis not present

## 2015-02-04 DIAGNOSIS — Z4889 Encounter for other specified surgical aftercare: Secondary | ICD-10-CM | POA: Diagnosis not present

## 2015-02-04 DIAGNOSIS — R262 Difficulty in walking, not elsewhere classified: Secondary | ICD-10-CM | POA: Diagnosis not present

## 2015-02-04 DIAGNOSIS — S72355D Nondisplaced comminuted fracture of shaft of left femur, subsequent encounter for closed fracture with routine healing: Secondary | ICD-10-CM | POA: Diagnosis not present

## 2015-02-17 ENCOUNTER — Ambulatory Visit: Payer: Medicare Other | Admitting: Internal Medicine

## 2015-02-17 ENCOUNTER — Telehealth: Payer: Self-pay | Admitting: Internal Medicine

## 2015-02-17 NOTE — Telephone Encounter (Signed)
Pt had appt for poss uti at 2:30 today.  Family Got her cleaned up and she is refusing to come.  Will not get Out of her chair.

## 2015-02-18 ENCOUNTER — Emergency Department (HOSPITAL_COMMUNITY): Payer: Medicare Other

## 2015-02-18 ENCOUNTER — Encounter (HOSPITAL_COMMUNITY): Payer: Self-pay | Admitting: Emergency Medicine

## 2015-02-18 ENCOUNTER — Inpatient Hospital Stay (HOSPITAL_COMMUNITY)
Admission: EM | Admit: 2015-02-18 | Discharge: 2015-02-22 | DRG: 871 | Disposition: A | Discharge status: Skilled Nursing Facility | Payer: Medicare Other | Attending: Internal Medicine | Admitting: Internal Medicine

## 2015-02-18 DIAGNOSIS — D682 Hereditary deficiency of other clotting factors: Secondary | ICD-10-CM | POA: Diagnosis not present

## 2015-02-18 DIAGNOSIS — N179 Acute kidney failure, unspecified: Secondary | ICD-10-CM | POA: Diagnosis present

## 2015-02-18 DIAGNOSIS — F319 Bipolar disorder, unspecified: Secondary | ICD-10-CM | POA: Diagnosis present

## 2015-02-18 DIAGNOSIS — E0590 Thyrotoxicosis, unspecified without thyrotoxic crisis or storm: Secondary | ICD-10-CM | POA: Diagnosis present

## 2015-02-18 DIAGNOSIS — Z7982 Long term (current) use of aspirin: Secondary | ICD-10-CM | POA: Diagnosis not present

## 2015-02-18 DIAGNOSIS — M5126 Other intervertebral disc displacement, lumbar region: Secondary | ICD-10-CM | POA: Diagnosis present

## 2015-02-18 DIAGNOSIS — B377 Candidal sepsis: Secondary | ICD-10-CM | POA: Diagnosis not present

## 2015-02-18 DIAGNOSIS — Z87891 Personal history of nicotine dependence: Secondary | ICD-10-CM | POA: Diagnosis not present

## 2015-02-18 DIAGNOSIS — A419 Sepsis, unspecified organism: Principal | ICD-10-CM | POA: Diagnosis present

## 2015-02-18 DIAGNOSIS — M1990 Unspecified osteoarthritis, unspecified site: Secondary | ICD-10-CM | POA: Diagnosis not present

## 2015-02-18 DIAGNOSIS — Z79899 Other long term (current) drug therapy: Secondary | ICD-10-CM

## 2015-02-18 DIAGNOSIS — Z9181 History of falling: Secondary | ICD-10-CM | POA: Diagnosis not present

## 2015-02-18 DIAGNOSIS — S72142D Displaced intertrochanteric fracture of left femur, subsequent encounter for closed fracture with routine healing: Secondary | ICD-10-CM

## 2015-02-18 DIAGNOSIS — Z888 Allergy status to other drugs, medicaments and biological substances status: Secondary | ICD-10-CM

## 2015-02-18 DIAGNOSIS — K219 Gastro-esophageal reflux disease without esophagitis: Secondary | ICD-10-CM | POA: Diagnosis present

## 2015-02-18 DIAGNOSIS — Z7901 Long term (current) use of anticoagulants: Secondary | ICD-10-CM | POA: Diagnosis not present

## 2015-02-18 DIAGNOSIS — R404 Transient alteration of awareness: Secondary | ICD-10-CM | POA: Diagnosis not present

## 2015-02-18 DIAGNOSIS — R911 Solitary pulmonary nodule: Secondary | ICD-10-CM | POA: Diagnosis present

## 2015-02-18 DIAGNOSIS — K625 Hemorrhage of anus and rectum: Secondary | ICD-10-CM | POA: Diagnosis not present

## 2015-02-18 DIAGNOSIS — X58XXXS Exposure to other specified factors, sequela: Secondary | ICD-10-CM | POA: Diagnosis present

## 2015-02-18 DIAGNOSIS — E785 Hyperlipidemia, unspecified: Secondary | ICD-10-CM | POA: Diagnosis not present

## 2015-02-18 DIAGNOSIS — R471 Dysarthria and anarthria: Secondary | ICD-10-CM | POA: Diagnosis not present

## 2015-02-18 DIAGNOSIS — E876 Hypokalemia: Secondary | ICD-10-CM | POA: Diagnosis not present

## 2015-02-18 DIAGNOSIS — IMO0001 Reserved for inherently not codable concepts without codable children: Secondary | ICD-10-CM | POA: Insufficient documentation

## 2015-02-18 DIAGNOSIS — G9340 Encephalopathy, unspecified: Secondary | ICD-10-CM | POA: Diagnosis not present

## 2015-02-18 DIAGNOSIS — I6931 Cognitive deficits following cerebral infarction: Secondary | ICD-10-CM | POA: Diagnosis not present

## 2015-02-18 DIAGNOSIS — R079 Chest pain, unspecified: Secondary | ICD-10-CM | POA: Diagnosis not present

## 2015-02-18 DIAGNOSIS — R278 Other lack of coordination: Secondary | ICD-10-CM | POA: Diagnosis not present

## 2015-02-18 DIAGNOSIS — K921 Melena: Secondary | ICD-10-CM | POA: Diagnosis present

## 2015-02-18 DIAGNOSIS — Z79891 Long term (current) use of opiate analgesic: Secondary | ICD-10-CM

## 2015-02-18 DIAGNOSIS — G2581 Restless legs syndrome: Secondary | ICD-10-CM

## 2015-02-18 DIAGNOSIS — N390 Urinary tract infection, site not specified: Secondary | ICD-10-CM | POA: Diagnosis present

## 2015-02-18 DIAGNOSIS — N184 Chronic kidney disease, stage 4 (severe): Secondary | ICD-10-CM | POA: Diagnosis not present

## 2015-02-18 DIAGNOSIS — E039 Hypothyroidism, unspecified: Secondary | ICD-10-CM | POA: Diagnosis present

## 2015-02-18 DIAGNOSIS — F0150 Vascular dementia without behavioral disturbance: Secondary | ICD-10-CM | POA: Diagnosis present

## 2015-02-18 DIAGNOSIS — I4891 Unspecified atrial fibrillation: Secondary | ICD-10-CM | POA: Diagnosis not present

## 2015-02-18 DIAGNOSIS — R2689 Other abnormalities of gait and mobility: Secondary | ICD-10-CM | POA: Diagnosis not present

## 2015-02-18 DIAGNOSIS — F0151 Vascular dementia with behavioral disturbance: Secondary | ICD-10-CM | POA: Diagnosis not present

## 2015-02-18 DIAGNOSIS — R531 Weakness: Secondary | ICD-10-CM | POA: Diagnosis not present

## 2015-02-18 DIAGNOSIS — I517 Cardiomegaly: Secondary | ICD-10-CM | POA: Diagnosis not present

## 2015-02-18 DIAGNOSIS — F419 Anxiety disorder, unspecified: Secondary | ICD-10-CM | POA: Diagnosis present

## 2015-02-18 DIAGNOSIS — I480 Paroxysmal atrial fibrillation: Secondary | ICD-10-CM | POA: Diagnosis present

## 2015-02-18 DIAGNOSIS — E872 Acidosis: Secondary | ICD-10-CM | POA: Diagnosis not present

## 2015-02-18 DIAGNOSIS — I129 Hypertensive chronic kidney disease with stage 1 through stage 4 chronic kidney disease, or unspecified chronic kidney disease: Secondary | ICD-10-CM | POA: Diagnosis present

## 2015-02-18 DIAGNOSIS — F0280 Dementia in other diseases classified elsewhere without behavioral disturbance: Secondary | ICD-10-CM | POA: Diagnosis not present

## 2015-02-18 DIAGNOSIS — M810 Age-related osteoporosis without current pathological fracture: Secondary | ICD-10-CM | POA: Diagnosis present

## 2015-02-18 DIAGNOSIS — R000 Tachycardia, unspecified: Secondary | ICD-10-CM | POA: Diagnosis not present

## 2015-02-18 DIAGNOSIS — Z885 Allergy status to narcotic agent status: Secondary | ICD-10-CM

## 2015-02-18 DIAGNOSIS — I2510 Atherosclerotic heart disease of native coronary artery without angina pectoris: Secondary | ICD-10-CM | POA: Diagnosis not present

## 2015-02-18 DIAGNOSIS — N183 Chronic kidney disease, stage 3 (moderate): Secondary | ICD-10-CM | POA: Diagnosis not present

## 2015-02-18 DIAGNOSIS — F0152 Vascular dementia, unspecified severity, with psychotic disturbance: Secondary | ICD-10-CM | POA: Diagnosis present

## 2015-02-18 DIAGNOSIS — F22 Delusional disorders: Secondary | ICD-10-CM

## 2015-02-18 DIAGNOSIS — Z881 Allergy status to other antibiotic agents status: Secondary | ICD-10-CM

## 2015-02-18 DIAGNOSIS — R918 Other nonspecific abnormal finding of lung field: Secondary | ICD-10-CM | POA: Diagnosis not present

## 2015-02-18 DIAGNOSIS — M6281 Muscle weakness (generalized): Secondary | ICD-10-CM | POA: Diagnosis not present

## 2015-02-18 HISTORY — DX: Chronic kidney disease, stage 4 (severe): N18.4

## 2015-02-18 HISTORY — DX: Solitary pulmonary nodule: R91.1

## 2015-02-18 HISTORY — DX: Pericardial effusion (noninflammatory): I31.3

## 2015-02-18 HISTORY — DX: Essential (primary) hypertension: I10

## 2015-02-18 HISTORY — DX: Personal history of nicotine dependence: Z87.891

## 2015-02-18 HISTORY — DX: Repeated falls: R29.6

## 2015-02-18 HISTORY — DX: Hyperlipidemia, unspecified: E78.5

## 2015-02-18 HISTORY — DX: Transient cerebral ischemic attack, unspecified: G45.9

## 2015-02-18 HISTORY — DX: Polyp of colon: K63.5

## 2015-02-18 HISTORY — DX: Displaced intertrochanteric fracture of unspecified femur, initial encounter for closed fracture: S72.143A

## 2015-02-18 HISTORY — DX: Hypothyroidism, unspecified: E03.9

## 2015-02-18 HISTORY — DX: Restless legs syndrome: G25.81

## 2015-02-18 HISTORY — DX: Other pericardial effusion (noninflammatory): I31.39

## 2015-02-18 LAB — CBC WITH DIFFERENTIAL/PLATELET
BASOS ABS: 0 10*3/uL (ref 0.0–0.1)
BASOS ABS: 0 10*3/uL (ref 0.0–0.1)
BASOS PCT: 0 % (ref 0–1)
BASOS PCT: 0 % (ref 0–1)
EOS ABS: 0 10*3/uL (ref 0.0–0.7)
Eosinophils Absolute: 0 10*3/uL (ref 0.0–0.7)
Eosinophils Relative: 0 % (ref 0–5)
Eosinophils Relative: 0 % (ref 0–5)
HCT: 37.5 % (ref 36.0–46.0)
HEMATOCRIT: 39.5 % (ref 36.0–46.0)
HEMOGLOBIN: 12.3 g/dL (ref 12.0–15.0)
Hemoglobin: 13 g/dL (ref 12.0–15.0)
LYMPHS ABS: 1.1 10*3/uL (ref 0.7–4.0)
LYMPHS PCT: 11 % — AB (ref 12–46)
Lymphocytes Relative: 12 % (ref 12–46)
Lymphs Abs: 1.4 10*3/uL (ref 0.7–4.0)
MCH: 29.3 pg (ref 26.0–34.0)
MCH: 29.2 pg (ref 26.0–34.0)
MCHC: 32.9 g/dL (ref 30.0–36.0)
MCHC: 32.8 g/dL (ref 30.0–36.0)
MCV: 89.2 fL (ref 78.0–100.0)
MCV: 89.1 fL (ref 78.0–100.0)
MONO ABS: 0.4 10*3/uL (ref 0.1–1.0)
Monocytes Absolute: 0.4 10*3/uL (ref 0.1–1.0)
Monocytes Relative: 4 % (ref 3–12)
Monocytes Relative: 4 % (ref 3–12)
Neutro Abs: 9.6 10*3/uL — ABNORMAL HIGH (ref 1.7–7.7)
Neutro Abs: 8.5 10*3/uL — ABNORMAL HIGH (ref 1.7–7.7)
Neutrophils Relative %: 84 % — ABNORMAL HIGH (ref 43–77)
Neutrophils Relative %: 85 % — ABNORMAL HIGH (ref 43–77)
Platelets: 263 10*3/uL (ref 150–400)
Platelets: 220 10*3/uL (ref 150–400)
RBC: 4.43 MIL/uL (ref 3.87–5.11)
RBC: 4.21 MIL/uL (ref 3.87–5.11)
RDW: 15.6 % — ABNORMAL HIGH (ref 11.5–15.5)
RDW: 15.7 % — AB (ref 11.5–15.5)
WBC: 11.5 10*3/uL — AB (ref 4.0–10.5)
WBC: 10 10*3/uL (ref 4.0–10.5)

## 2015-02-18 LAB — TROPONIN I
Troponin I: 0.06 ng/mL — ABNORMAL HIGH (ref <0.031)
Troponin I: 0.05 ng/mL — ABNORMAL HIGH (ref <0.031)

## 2015-02-18 LAB — BASIC METABOLIC PANEL
Anion gap: 15 (ref 5–15)
BUN: 26 mg/dL — AB (ref 6–20)
CO2: 19 mmol/L — ABNORMAL LOW (ref 22–32)
Calcium: 8.5 mg/dL — ABNORMAL LOW (ref 8.9–10.3)
Chloride: 113 mmol/L — ABNORMAL HIGH (ref 101–111)
Creatinine, Ser: 2.53 mg/dL — ABNORMAL HIGH (ref 0.44–1.00)
GFR calc Af Amer: 19 mL/min — ABNORMAL LOW (ref >60)
GFR, EST NON AFRICAN AMERICAN: 16 mL/min — AB (ref >60)
Glucose, Bld: 101 mg/dL — ABNORMAL HIGH (ref 65–99)
POTASSIUM: 2.8 mmol/L — AB (ref 3.5–5.1)
Sodium: 147 mmol/L — ABNORMAL HIGH (ref 135–145)

## 2015-02-18 LAB — I-STAT CG4 LACTIC ACID, ED
Lactic Acid, Venous: 2.63 mmol/L (ref 0.5–2.0)
Lactic Acid, Venous: 2.6 mmol/L (ref 0.5–2.0)

## 2015-02-18 LAB — URINE MICROSCOPIC-ADD ON

## 2015-02-18 LAB — COMPREHENSIVE METABOLIC PANEL
ALBUMIN: 2 g/dL — AB (ref 3.5–5.0)
ALT: 12 U/L — AB (ref 14–54)
AST: 20 U/L (ref 15–41)
Alkaline Phosphatase: 175 U/L — ABNORMAL HIGH (ref 38–126)
Anion gap: 7 (ref 5–15)
BILIRUBIN TOTAL: 0.5 mg/dL (ref 0.3–1.2)
BUN: 23 mg/dL — AB (ref 6–20)
CO2: 22 mmol/L (ref 22–32)
Calcium: 8 mg/dL — ABNORMAL LOW (ref 8.9–10.3)
Chloride: 116 mmol/L — ABNORMAL HIGH (ref 101–111)
Creatinine, Ser: 2.21 mg/dL — ABNORMAL HIGH (ref 0.44–1.00)
GFR calc non Af Amer: 19 mL/min — ABNORMAL LOW (ref >60)
GFR, EST AFRICAN AMERICAN: 23 mL/min — AB (ref >60)
GLUCOSE: 121 mg/dL — AB (ref 65–99)
Potassium: 2.3 mmol/L — CL (ref 3.5–5.1)
Sodium: 145 mmol/L (ref 135–145)
Total Protein: 5.1 g/dL — ABNORMAL LOW (ref 6.5–8.1)

## 2015-02-18 LAB — PROTIME-INR
INR: 1.2 (ref 0.00–1.49)
PROTHROMBIN TIME: 15.4 s — AB (ref 11.6–15.2)

## 2015-02-18 LAB — URINALYSIS, ROUTINE W REFLEX MICROSCOPIC
GLUCOSE, UA: NEGATIVE mg/dL
KETONES UR: 15 mg/dL — AB
NITRITE: NEGATIVE
PH: 6 (ref 5.0–8.0)
Protein, ur: 100 mg/dL — AB
Specific Gravity, Urine: 1.019 (ref 1.005–1.030)
UROBILINOGEN UA: 1 mg/dL (ref 0.0–1.0)

## 2015-02-18 LAB — MRSA PCR SCREENING: MRSA by PCR: NEGATIVE

## 2015-02-18 LAB — TSH: TSH: 0.565 u[IU]/mL (ref 0.350–4.500)

## 2015-02-18 LAB — LACTIC ACID, PLASMA
Lactic Acid, Venous: 1 mmol/L (ref 0.5–2.0)
Lactic Acid, Venous: 2.1 mmol/L (ref 0.5–2.0)

## 2015-02-18 LAB — I-STAT TROPONIN, ED: Troponin i, poc: 0.04 ng/mL (ref 0.00–0.08)

## 2015-02-18 LAB — PROCALCITONIN: PROCALCITONIN: 0.18 ng/mL

## 2015-02-18 LAB — APTT: aPTT: 24 seconds (ref 24–37)

## 2015-02-18 LAB — MAGNESIUM: Magnesium: 1.8 mg/dL (ref 1.7–2.4)

## 2015-02-18 MED ORDER — SODIUM CHLORIDE 0.9 % IV BOLUS (SEPSIS)
1000.0000 mL | Freq: Once | INTRAVENOUS | Status: AC
  Administered 2015-02-18: 1000 mL via INTRAVENOUS

## 2015-02-18 MED ORDER — MEMANTINE HCL 10 MG PO TABS
10.0000 mg | ORAL_TABLET | Freq: Two times a day (BID) | ORAL | Status: DC
  Administered 2015-02-18 – 2015-02-22 (×8): 10 mg via ORAL
  Filled 2015-02-18 (×9): qty 1

## 2015-02-18 MED ORDER — SODIUM BICARBONATE 650 MG PO TABS
650.0000 mg | ORAL_TABLET | Freq: Two times a day (BID) | ORAL | Status: DC
  Administered 2015-02-18 – 2015-02-20 (×4): 650 mg via ORAL
  Filled 2015-02-18 (×5): qty 1

## 2015-02-18 MED ORDER — ASPIRIN EC 81 MG PO TBEC
81.0000 mg | DELAYED_RELEASE_TABLET | Freq: Every day | ORAL | Status: DC
Start: 2015-02-18 — End: 2015-02-21
  Administered 2015-02-18 – 2015-02-21 (×4): 81 mg via ORAL
  Filled 2015-02-18 (×4): qty 1

## 2015-02-18 MED ORDER — SODIUM CHLORIDE 0.9 % IV SOLN
INTRAVENOUS | Status: DC
  Administered 2015-02-18 – 2015-02-20 (×4): via INTRAVENOUS

## 2015-02-18 MED ORDER — POTASSIUM CHLORIDE 10 MEQ/100ML IV SOLN
10.0000 meq | INTRAVENOUS | Status: AC
  Administered 2015-02-18 – 2015-02-19 (×4): 10 meq via INTRAVENOUS
  Filled 2015-02-18 (×4): qty 100

## 2015-02-18 MED ORDER — ONDANSETRON HCL 4 MG/2ML IJ SOLN
4.0000 mg | Freq: Four times a day (QID) | INTRAMUSCULAR | Status: DC | PRN
  Administered 2015-02-19: 4 mg via INTRAVENOUS
  Filled 2015-02-18: qty 2

## 2015-02-18 MED ORDER — DILTIAZEM HCL 25 MG/5ML IV SOLN
10.0000 mg | Freq: Once | INTRAVENOUS | Status: AC
  Administered 2015-02-18: 10 mg via INTRAVENOUS
  Filled 2015-02-18: qty 5

## 2015-02-18 MED ORDER — CEFTRIAXONE SODIUM 2 G IJ SOLR: 2.0000 g | Freq: Once | INTRAMUSCULAR | Status: DC

## 2015-02-18 MED ORDER — FLUOXETINE HCL 40 MG PO CAPS
40.0000 mg | ORAL_CAPSULE | Freq: Every day | ORAL | Status: DC
  Administered 2015-02-18 – 2015-02-22 (×5): 40 mg via ORAL
  Filled 2015-02-18 (×5): qty 1

## 2015-02-18 MED ORDER — LEVOTHYROXINE SODIUM 50 MCG PO TABS
50.0000 ug | ORAL_TABLET | Freq: Every day | ORAL | Status: DC
Start: 2015-02-19 — End: 2015-02-22
  Administered 2015-02-19 – 2015-02-22 (×4): 50 ug via ORAL
  Filled 2015-02-18 (×5): qty 1

## 2015-02-18 MED ORDER — POTASSIUM CHLORIDE CRYS ER 20 MEQ PO TBCR
40.0000 meq | EXTENDED_RELEASE_TABLET | Freq: Two times a day (BID) | ORAL | Status: DC
  Administered 2015-02-18 – 2015-02-19 (×2): 40 meq via ORAL
  Filled 2015-02-18 (×3): qty 2

## 2015-02-18 MED ORDER — DEXTROSE 5 % IV SOLN
2.0000 g | Freq: Once | INTRAVENOUS | Status: AC
  Administered 2015-02-18: 2 g via INTRAVENOUS
  Filled 2015-02-18: qty 2

## 2015-02-18 MED ORDER — ACETAMINOPHEN 325 MG PO TABS
650.0000 mg | ORAL_TABLET | ORAL | Status: DC | PRN
  Administered 2015-02-18 – 2015-02-20 (×2): 650 mg via ORAL
  Filled 2015-02-18 (×2): qty 2

## 2015-02-18 MED ORDER — POTASSIUM CHLORIDE CRYS ER 20 MEQ PO TBCR
40.0000 meq | EXTENDED_RELEASE_TABLET | Freq: Once | ORAL | Status: AC
  Administered 2015-02-18: 40 meq via ORAL
  Filled 2015-02-18: qty 2

## 2015-02-18 MED ORDER — ASPIRIN 81 MG PO TABS
81.0000 mg | ORAL_TABLET | Freq: Every day | ORAL | Status: DC
  Filled 2015-02-18: qty 1

## 2015-02-18 MED ORDER — DEXTROSE 5 % IV SOLN: 2.0000 g | INTRAVENOUS | Status: DC

## 2015-02-18 MED ORDER — DONEPEZIL HCL 5 MG PO TABS
5.0000 mg | ORAL_TABLET | Freq: Every day | ORAL | Status: DC
  Administered 2015-02-18 – 2015-02-21 (×4): 5 mg via ORAL
  Filled 2015-02-18 (×5): qty 1

## 2015-02-18 MED ORDER — DEXTROSE 5 % IV SOLN
5.0000 mg/h | INTRAVENOUS | Status: DC
  Administered 2015-02-18: 5 mg/h via INTRAVENOUS
  Administered 2015-02-18: 10 mg/h via INTRAVENOUS
  Filled 2015-02-18 (×2): qty 100

## 2015-02-18 MED ORDER — ENOXAPARIN SODIUM 80 MG/0.8ML ~~LOC~~ SOLN
1.0000 mg/kg | SUBCUTANEOUS | Status: DC
  Administered 2015-02-18 – 2015-02-20 (×3): 70 mg via SUBCUTANEOUS
  Filled 2015-02-18 (×4): qty 0.8

## 2015-02-18 MED ORDER — DEXTROSE 5 % IV SOLN
1.0000 g | INTRAVENOUS | Status: DC
  Administered 2015-02-19 – 2015-02-22 (×4): 1 g via INTRAVENOUS
  Filled 2015-02-18 (×4): qty 10

## 2015-02-18 NOTE — ED Notes (Signed)
Per [facility], pt from home, called out for weakness. Pt couldn't make it to the door. Pt lying in bed, pt seemed anxious. Pt states she doesn't feel like she can walk, urinary frequency and dysuria. Pt is AAox3 per her norm. No unilateral deficits, just generalized weakness.

## 2015-02-18 NOTE — Consult Note (Signed)
Cardiology Consultation Note  Patient ID: Jennifer [name], MRN: [id number], DOB/AGE: 02/22/1929 81 y.o. Admit date: 02/18/2015   Date of Consult: 02/18/2015 Primary Physician: Rogelia Boga, MD Primary Cardiologist: New  Chief Complaint: falls Reason for Consultation: newly recognized atrial fib  HPI: Ms. 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 Do not see formal cardiac history but the CT in 2012 did show coronary artery disease as well as a moderately large pericardial effusion. Last echo 2009 - EF 50%, mildly increased LV wall thickness, calcified aortic valve, RV mod dilated, mild TS, mod RAE, small pericardial effusion.  After her hip fracture surgery, she went to [facility] for about a month and then went to stay with her daughter Jennifer [name]. She was recently taken back home by her family and has been staying alone at home. She has been doing some of her ADLs and IADLs independently and walks with a walker and a cane but fell yesterday. The patient was reported to be very weak today, and said she didn't feel like she could walk. She was also reporting urinary frequency and dysuria. Due to symptoms, her family brought her to the ER today. She was noted to be in atrial fib with mildly elevated rate - a new dx for her. Labwork notable for WBC 11.5, lactic acid 2.63, troponin neg x 1, Na 147, K 2.8, BUN/Cr 26/2.53 (prev Cr 2.05). Blood cx pending. CXR stable mild cardiomegaly without failure, + emphysema, progressive vertebral compression fx at T9, question superior endplate fx at T6 new since 2013. Received 2L NS in the ER. IM treating for possible sepsis from urinary source as well as AKI  on CKD with metabolic acidosis and hypokalemia. TSH & blood cultures pending. BP variable from 100/56 to 140/92. Received 10mg  IV diltiazem bolus and now drip at 10/hr. She does endorse increased sense of breathing. No chest pain, overt palpitations, edema. Knows she is in a hospital. Thought it was 2015, January. Isn't a great historian otherwise. No family present during our interview.   Past Medical History  Diagnosis Date  . ALLERGIC RHINITIS 02/04/2007  . ANEMIA-IRON DEFICIENCY 08/11/2008  . ANXIETY 08/11/2008  . FREQUENCY, URINARY 04/28/2008  . GERD 02/04/2007  . Hypopotassemia 11/08/2008  . LOW BACK PAIN 12/31/2007  . OSTEOARTHRITIS 12/31/2007  . OSTEOPOROSIS 02/04/2007  . PRURITUS 06/03/2009  . Restless leg syndrome   . Vascular dementia with delusions 11/25/2008    a. H/o multi-infarct dementia secondary to multiple to CVA-2010?Jennifer [name]  . WEIGHT LOSS 11/05/2008  . TIA (transient ischemic attack)     a. Per son, had TIAs in 2010.  Jennifer [name] Former tobacco use     a. 35 pack year, quit 1990s.  . Hypothyroidism   . Hypertension   . CKD (chronic kidney disease), stage IV   . Lung nodule     a. 2012 - 5 mm left lower lobe nodule.  Jennifer [name] Hyperlipidemia   . Frequent falls   . Closed comminuted intertrochanteric fracture of proximal femur     a. 11/2014 requiring surgery - mechanical fall.  . Pericardial effusion     a. Mod-large pericardial effusion in 2012 by CT report.     Surgical History:  Past Surgical  History  Procedure Laterality Date  . Hemorrhoid surgery    . Abdominal hysterectomy    . Tonsillectomy    . Knee surgery      right  . Laminectomy    . Back surgery    . Tonsillectomy    . Knee arthroscopy      Right  . Appendectomy    . Lumbar laminectomy/decompression microdiscectomy  12/31/2011    Procedure: LUMBAR LAMINECTOMY/DECOMPRESSION MICRODISCECTOMY 1 LEVEL;  Surgeon: Cristi Loron, MD;  Location: [facility];  Service: Neurosurgery;  Laterality: Left;  Left Lumbar Four-Five  Discectomy redo   . Femur im nail Left 11/28/2104  . Intramedullary (im) nail intertrochanteric Left 11/29/2014    Procedure: INTRAMEDULLARY (IM) NAIL INTERTROCHANTRIC;  Surgeon: Tarry Kos, MD;  Location: [facility] OR;  Service: Orthopedics;  Laterality: Left;     Home Meds: Prior to Admission medications   Medication Sig Start Date End Date Taking? Authorizing Provider  amLODipine (NORVASC) 10 MG tablet TAKE 1 TABLET BY MOUTH EVERY DAY 10/07/14  Yes Gordy Savers, MD  aspirin 81 MG tablet Take 81 mg by mouth daily.     Yes Historical Provider, MD  Calcium Carbonate-Vitamin D (CALCIUM + D PO) Take 1 tablet by mouth 2 (two) times daily.   Yes Historical Provider, MD  Cholecalciferol (VITAMIN D3) 2000 UNITS TABS Take 1 tablet by mouth daily.   Yes Historical Provider, MD  diphenhydrAMINE (BENADRYL) 25 MG tablet Take 25 mg by mouth as needed for allergies.    Yes Historical Provider, MD  donepezil (ARICEPT) 5 MG tablet TAKE 1 TABLET BY MOUTH AT BEDTIME 01/06/15  Yes Gordy Savers, MD  FLUoxetine (PROZAC) 40 MG capsule TAKE ONE CAPSULE BY MOUTH EVERY DAY 10/07/14  Yes Gordy Savers, MD  levothyroxine (SYNTHROID, LEVOTHROID) 50 MCG tablet TAKE 1 TABLET BY MOUTH EVERY DAY 01/28/14  Yes Gordy Savers, MD  losartan (COZAAR) 100 MG tablet TAKE 1 TABLET BY MOUTH EVERY DAY 12/21/14  Yes Gordy Savers, MD  memantine (NAMENDA) 10 MG tablet TAKE 1 TABLET BY MOUTH TWICE A DAY 08/18/14  Yes Gordy Savers, MD  ranitidine (ZANTAC) 75 MG tablet Take 75 mg by mouth daily.   Yes Historical Provider, MD  benzonatate (TESSALON) 100 MG capsule Take 1 capsule (100 mg total) by mouth 2 (two) times daily as needed for cough. Patient not taking: Reported on 02/18/2015 01/28/14   Gordy Savers, MD  enoxaparin (LOVENOX) 40 MG/0.4ML injection Inject 0.4 mLs (40 mg total) into the skin daily. Patient not taking: Reported on 02/18/2015 11/29/14   Tarry Kos, MD  HYDROcodone-acetaminophen  (NORCO/VICODIN) 5-325 MG per tablet Take 1-2 tablets by mouth every 6 (six) hours as needed for moderate pain. Patient not taking: Reported on 02/18/2015 12/03/14   Leatha Gilding, MD  losartan (COZAAR) 100 MG tablet TAKE 1 TABLET BY MOUTH EVERY DAY Patient not taking: Reported on 02/18/2015 01/18/15   Gordy Savers, MD  oxyCODONE (OXY IR/ROXICODONE) 5 MG immediate release tablet Take 1-3 tablets (5-15 mg total) by mouth every 4 (four) hours as needed. Patient not taking: Reported on 02/18/2015 11/29/14   Tarry Kos, MD    Inpatient Medications:  . aspirin  81 mg Oral Daily  . [START ON 02/19/2015] cefTRIAXone (ROCEPHIN)  IV  2 g Intravenous Q24H  . cefTRIAXone (ROCEPHIN)  IV  2 g Intravenous Once  . donepezil  5 mg Oral QHS  . FLUoxetine  40 mg  Oral Daily  . [START ON 02/19/2015] levothyroxine  50 mcg Oral QAC breakfast  . memantine  10 mg Oral BID  . potassium chloride  40 mEq Oral BID  . sodium bicarbonate  650 mg Oral BID   . sodium chloride 125 mL/hr at 02/18/15 1657  . diltiazem (CARDIZEM) infusion 10 mg/hr (02/18/15 1501)    Allergies:  Allergies  Allergen Reactions  . Ciprofloxacin     REACTION: itching  . Diazepam     REACTION: unspecified  . Meperidine Hcl     REACTION: unspecified    History   Social History  . Marital Status: Widowed    Spouse Name: N/A  . Number of Children: N/A  . Years of Education: N/A   Occupational History  . Not on file.   Social History Main Topics  . Smoking status: Former Smoker    Quit date: 07/16/1988  . Smokeless tobacco: Never Used  . Alcohol Use: No  . Drug Use: No  . Sexual Activity: Not on file   Other Topics Concern  . 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  Hypertension    . Diabetes Mellitus II       Review of Systems:All other systems reviewed and are otherwise negative except as noted above. Suspect authenticity limited by patient's dementia.  Labs: Troponin neg x1 Lab Results  Component Value Date   WBC 11.5* 02/18/2015   HGB 13.0 02/18/2015   HCT 39.5 02/18/2015   MCV 89.2 02/18/2015   PLT 263 02/18/2015     Recent Labs Lab 02/18/15 1211  NA 147*  K 2.8*  CL 113*  CO2 19*  BUN 26*  CREATININE 2.53*  CALCIUM 8.5*  GLUCOSE 101*   Lab Results  Component Value Date   CHOL 237* 09/04/2011   HDL 66.70 09/04/2011   TRIG 83.0 09/04/2011   Radiology/Studies:  Dg Chest 2 View  02/18/2015   CLINICAL DATA:  Weakness, confusion, urinary frequency, and dysuria. Tachycardia. Chest pain.  EXAM: CHEST - 2 VIEW  COMPARISON:  One-view chest 11/28/2014. Two-view chest x-ray 12/26/2011.  FINDINGS: Atherosclerotic changes are present at the aortic arch. The heart size is upper limits of normal. There is no edema or effusion to suggest failure. Mild emphysematous changes are noted. Exaggerated thoracic kyphosis is evident. The T9 vertebral plana compression fracture has progressed. A superior endplate fracture at T6 is age indeterminate.  IMPRESSION: 1. Stable mild cardiomegaly without failure. 2. Emphysema. 3. Progressive vertebral plana compression fracture at T9. 4. Question superior endplate fracture at T6, new since 2013.   Electronically Signed   By: Marin Roberts M.D.   On: 02/18/2015 12:14    Wt Readings from Last 3 Encounters:  02/18/15 156 lb (70.761 kg)  11/28/14 156 lb (70.761 kg)  01/28/14 169 lb (76.658 kg)    EKG: atrial fib 136bpm, one PVC, low voltage throughout with poor R wave progression, nonspecific T wave changes. Aside from rhythm no sig change from prior.  Physical Exam: Blood pressure 103/50, pulse 107, temperature 97.4 F (36.3 C), temperature source Oral, resp. rate 23, weight 156 lb (70.761  kg), SpO2 95  %. General: Well developed, well nourished, in no acute distress. Head: Normocephalic, atraumatic, sclera non-icteric, no xanthomas, nares are without discharge.  Neck: JVD not elevated. Lungs: Clear bilaterally to auscultation without wheezes, rales, or rhonchi. Breathing is unlabored. Heart: Irregularly irregular, borderline elevated rate, with S1 S2. No murmurs, rubs, or gallops appreciated. Abdomen: Soft, non-tender, non-distended with normoactive bowel sounds. No hepatomegaly. No rebound/guarding. No obvious abdominal masses. Msk:  Strength and tone appear normal for age. Extremities: No clubbing or cyanosis. No edema.  Distal pedal pulses are 2+ and equal bilaterally. Neuro: Alert. Attempts to answer questions appropriately regarding symptoms, but thinks it's January 2015. Knows she's in a hospital but doesn't know name. She knows her name and DOB. Follow commands.    Assessment and Plan:   1. Newly recognized atrial fibrillation - agree with diltiazem drip for now. Rates improved down to 110 on 10mg /hr which may be appropriate for the level of medical illness. Can titrate further as BP allows. Hold amlodipine while on diltiazem. CHADSVASC = for now is confirmed at 6, most recent EF not known. Internal medicine has ordered Lovenox per pharmacy for atrial fib while inpatient. While anticoagulation would typically be the recommendation with such a high CHADSVASC score, she is not an ideal candidate for long-term oral anticoagulation given history of repeated falls and dementia. We can make definitive recommendation once we see how she progresses this admission. Would order echo when rate is better controlled.  2. Sepsis possibly due to urinary source with acute kidney injury, metabolic acidosis, hypokalemia - per IM.  3. Hypothyroidism - TSH pending.  4. Former smoker with possible COPD and lung nodule - nodule will eventually need f/u as recommended. Was supposed to have a CT scan 6-12  months after this was noted in 2012, given risk of bronchogenic carcinoma.  Signed, Ronie Spies PA-C 02/18/2015, 5:28 PM Pager: [phone]  I saw and examined the patient along with Ronie Spies, PA-C. We reviewed the chart together and discussed her presenting symptoms and examination. I agree with her findings, exam and recommendations as noted above.  Elderly woman with new diagnosis of atrial fibrillation currently on diltiazem drip now. Rates are relatively stable based on what one could consider in the setting of likely sepsis. Would not be overly aggressive treating heart rate as this is increased by the underlying cause. I agree with the assessment about anticoagulation and concerns with possible falls. This can be better assessed that she she recovers from her acute illness.  We will follow along and monitor. Will review echocardiogram when completed. Of note, she is a former smoker with likely COPD and lung nodule that was not further followed up on.   Marykay Lex, M.D., M.S. Interventional Cardiologist   Pager # [phone]

## 2015-02-18 NOTE — Progress Notes (Signed)
ANTIBIOTIC & ANTICOAGULANT CONSULT NOTE - INITIAL  Pharmacy Consult for Ceftriaxone & Enoxaparin Indication: Urosepsis & Afib  Allergies  Allergen Reactions  . Ciprofloxacin     REACTION: itching  . Diazepam     REACTION: unspecified  . Meperidine Hcl     REACTION: unspecified    Patient Measurements: Weight: 156 lb (70.761 kg)  Vital Signs: Temp: 97.4 F (36.3 C) (08/05 1119) Temp Source: Oral (08/05 1119) BP: 103/50 mmHg (08/05 1615) Pulse Rate: 107 (08/05 1600) Intake/Output from previous day:   Intake/Output from this shift:    Labs:  Recent Labs  02/18/15 1211  WBC 11.5*  HGB 13.0  PLT 263  CREATININE 2.53*   Estimated Creatinine Clearance: 15.8 mL/min (by C-G formula based on Cr of 2.53). No results for input(s): VANCOTROUGH, VANCOPEAK, VANCORANDOM, GENTTROUGH, GENTPEAK, GENTRANDOM, TOBRATROUGH, TOBRAPEAK, TOBRARND, AMIKACINPEAK, AMIKACINTROU, AMIKACIN in the last 72 hours.   Microbiology: Recent Results (from the past 720 hour(s))  Blood Culture (routine x 2)     Status: None (Preliminary result)   Collection Time: 02/18/15 12:36 PM  Result Value Ref Range Status   Specimen Description BLOOD RIGHT ANTECUBITAL  Final   Special Requests BOTTLES DRAWN AEROBIC AND ANAEROBIC 5CC  Final   Culture PENDING  Incomplete   Report Status PENDING  Incomplete    Medical History: Past Medical History  Diagnosis Date  . ALLERGIC RHINITIS 02/04/2007  . ANEMIA-IRON DEFICIENCY 08/11/2008  . ANXIETY 08/11/2008  . FREQUENCY, URINARY 04/28/2008  . GERD 02/04/2007  . Hypopotassemia 11/08/2008  . LOW BACK PAIN 12/31/2007  . OSTEOARTHRITIS 12/31/2007  . OSTEOPOROSIS 02/04/2007  . PRURITUS 06/03/2009  . Restless leg syndrome   . Vascular dementia with delusions 11/25/2008    a. H/o multi-infarct dementia secondary to multiple to CVA-2010?[name]  . WEIGHT LOSS 11/05/2008  . TIA (transient ischemic attack)     a. Per son, had TIAs in 2010.  [name] Former tobacco use     a. 35 pack  year, quit 1990s.  . Hypothyroidism   . Hypertension   . CKD (chronic kidney disease), stage IV   . Lung nodule     a. 2012 - 5 mm left lower lobe nodule.  [name] Hyperlipidemia   . Frequent falls   . Closed comminuted intertrochanteric fracture of proximal femur     a. 11/2014 requiring surgery - mechanical fall.    Assessment: 81 yo F presents on 8/5 with AKI on top of CKD stage IV. UA on admission shows many bacteria and large leukocytes. Pharmacy consulted to dose ceftriaxone for urosepsis and enoxaparin for Afib. Afebrile and WBC elevated at 11.5. Blood cx's drawn. Given ceftriaxone 2g IV x 1 today. Not on any anticoag at home. MD considering oral a/c options. CBC stable, no s/s of bleed. SCr elevated to 2.53, CrCl ~61ml/min.  Goal of Therapy:  Resolution of infection  Plan:  Start ceftriaxone 1g IV Q24 tomorrow Monitor clinical picture, renal function F/U C&S, abx deescalation / LOT  Start enoxaparin  [place] Q24 Monitor CBC, s/s of bleed F/U oral a/c plans  Jennifer [name] 02/18/2015,5:21 PM

## 2015-02-18 NOTE — Telephone Encounter (Signed)
Fyi.

## 2015-02-18 NOTE — H&P (Signed)
[facility] History and Physical  FRANCIS YARDLEY MVH:[id number] DOB: 1929-02-08 DOA: 02/18/2015  Referring physician: Dr. Lynford Humphrey PCP: Rogelia Boga, MD  Specialists: None as yet  81 y/o ? 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 fib in the emergency room  After convalescence from her hip fracture she went to [facility] for about a month and then went to stay with her daughter [name]. She was recently taken back home by her family and has been staying alone at home. She has been doing some of her ADLs and IADLs independently and walks with a walker and a cane but fell yesterday  Her daughter adds to the history and was concerned that because patient is wearing depends she may have had a UTI. She has not had any fever, any chills any nausea and vomiting any diarrhea or any dark stool any tarry stool any chest pain any nausea and vomiting any unilateral weakness and he stroke like symptoms any rash any exposure to anyone ill  She has some confusion at baseline and can tell me that she is in [place], this is [facility], this is [place], she mistakenly does this for [phone] as opposed to 2015 however contaminated that the month is December She cannot tell me the day or date  Her son tells me that she's had TIAs in 2010  Review of systems Except as above patient has no shortness of breath, no dysuria, no chest pain, She tells me she has not had any subjective feeling of fast heart rate and is never had this before.   Sodium 147 potassium 2.8 BUN 26 creatinine 2.5 bicarbonate 19 WBC 11.5, platelet  count 263 blood culture X2 performed Lactic acid 2.6 CXR = mild cardiomegaly without failure, emphysema, progressive vertebral plan a compression fracture T9, super endplate fracture T6 new since 2013   Past Medical History  Diagnosis Date  . ALLERGIC RHINITIS 02/04/2007  . Altered mental status 11/05/2008  . ANEMIA-IRON DEFICIENCY 08/11/2008  . ANEMIA 06/30/2008  . ANXIETY 08/11/2008  . FREQUENCY, URINARY 04/28/2008  . GERD 02/04/2007  . HYPERTENSION 02/04/2007  . Hypopotassemia 11/08/2008  . HYPOTHYROIDISM 02/04/2007  . LOW BACK PAIN 12/31/2007  . OSTEOARTHRITIS 12/31/2007  . OSTEOPOROSIS 02/04/2007  . PRURITUS 06/03/2009  . RESTLESS LEG SYNDROME 03/18/2009  . Vascular dementia with delusions 11/25/2008  . WEAKNESS 11/05/2008  . WEIGHT LOSS 11/05/2008   Past Surgical History  Procedure Laterality Date  . Hemorrhoid surgery    . Abdominal hysterectomy    . Tonsillectomy    . Knee surgery      right  . Laminectomy    . Back surgery    . Tonsillectomy    . Knee arthroscopy      Right  . Appendectomy    . Lumbar laminectomy/decompression microdiscectomy  12/31/2011    Procedure: LUMBAR LAMINECTOMY/DECOMPRESSION MICRODISCECTOMY 1 LEVEL;  Surgeon: Cristi Loron, MD;  Location: [facility];  Service: Neurosurgery;  Laterality: Left;  Left Lumbar Four-Five Discectomy redo   . Femur im nail Left 11/28/2104  . 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 Ciprofloxacin     REACTION: itching  . Diazepam     REACTION: unspecified  .  Meperidine Hcl     REACTION: unspecified    Family History  Problem Relation Age of Onset  . Hypertension    . Diabetes Mellitus II      Prior to Admission medications   Medication Sig Start Date End Date Taking? Authorizing Provider  amLODipine (NORVASC) 10 MG tablet TAKE 1 TABLET BY MOUTH EVERY DAY 10/07/14   Gordy Savers, MD  aspirin 81 MG tablet Take 81 mg by mouth daily.      Historical Provider, MD  benzonatate (TESSALON) 100 MG capsule Take 1 capsule (100 mg total) by mouth 2 (two) times daily as needed for cough. Patient not taking: Reported on 11/28/2014 01/28/14   Gordy Savers, MD  Calcium Carbonate-Vitamin D (CALCIUM + D PO) Take 1 tablet by mouth 2 (two) times daily.    Historical Provider, MD  cephALEXin (KEFLEX) 250 MG capsule Take 1 capsule (250 mg total) by mouth 2 (two) times daily. 12/03/14   Costin Otelia Sergeant, MD  diphenhydrAMINE (BENADRYL) 25 MG tablet Take 25 mg by mouth as needed.    Historical Provider, MD  donepezil (ARICEPT) 5 MG tablet TAKE 1 TABLET BY MOUTH AT BEDTIME 01/06/15   Gordy Savers, MD  enoxaparin (LOVENOX) 40 MG/0.4ML injection Inject 0.4 mLs (40 mg total) into the skin daily. 11/29/14   Tarry Kos, MD  FLUoxetine (PROZAC) 40 MG capsule TAKE ONE CAPSULE BY MOUTH EVERY DAY 10/07/14   Gordy Savers, MD  HYDROcodone-acetaminophen (NORCO/VICODIN) 5-325 MG per tablet Take 1-2 tablets by mouth every 6 (six) hours as needed for moderate pain. 12/03/14   Costin Otelia Sergeant, MD  levothyroxine (SYNTHROID, LEVOTHROID) 50 MCG tablet TAKE 1 TABLET BY MOUTH EVERY DAY 01/28/14   Gordy Savers, MD  losartan (COZAAR) 100 MG tablet TAKE 1 TABLET BY MOUTH EVERY DAY 12/21/14   Gordy Savers, MD  losartan (COZAAR) 100 MG tablet TAKE 1 TABLET BY MOUTH EVERY DAY 01/18/15   Gordy Savers, MD  memantine (NAMENDA) 10 MG tablet TAKE 1 TABLET BY MOUTH TWICE A DAY 08/18/14   Gordy Savers, MD  oxyCODONE (OXY IR/ROXICODONE) 5 MG immediate release  tablet Take 1-3 tablets (5-15 mg total) by mouth every 4 (four) hours as needed. 11/29/14   Tarry Kos, MD  ranitidine (ZANTAC) 75 MG tablet Take 75 mg by mouth daily.    Historical Provider, MD   Physical Exam: Filed Vitals:   02/18/15 1315 02/18/15 1330 02/18/15 1345 02/18/15 1400  BP: 111/93 119/95 135/77 114/75  Pulse: 103 122  144  Temp:      TempSrc:      Resp: 16 21 24 24   Weight:      SpO2: 95% 100% 91% 92%   EOMI NCAT no pallor no icterus, throat is clear, no JVD, no bruit, S1-S2 irregularly irregular, no displacement of PMI, chest is clinically clear, abdomen is soft nontender nondistended no  rebound no guarding Left leg greater than right leg edema however no cord nor palpable sensation No pitting edema Memory is intact but somewhat fuzzy on certain things as above  Labs on Admission:  Basic Metabolic Panel:  Recent Labs Lab 02/18/15 1211  NA 147*  K 2.8*  CL 113*  CO2 19*  GLUCOSE 101*  BUN 26*  CREATININE 2.53*  CALCIUM 8.5*  MG 1.8   Liver Function Tests: No results for input(s): AST, ALT, ALKPHOS, BILITOT, PROT, ALBUMIN in the last 168 hours. No results for input(s): LIPASE, AMYLASE in the last 168 hours. No results for input(s): AMMONIA in the last 168 hours. CBC:  Recent Labs Lab 02/18/15 1211  WBC 11.5*  NEUTROABS 9.6*  HGB 13.0  HCT 39.5  MCV 89.2  PLT 263   Cardiac Enzymes: No results for input(s): CKTOTAL, CKMB, CKMBINDEX, TROPONINI in the last 168 hours.  BNP (last 3 results) No results for input(s): BNP in the last 8760 hours.  ProBNP (last 3 results) No results for input(s): PROBNP in the last 8760 hours.  CBG: No results for input(s): GLUCAP in the last 168 hours.  Radiological Exams on Admission: Dg Chest 2 View  02/18/2015   CLINICAL DATA:  Weakness, confusion, urinary frequency, and dysuria. Tachycardia. Chest pain.  EXAM: CHEST - 2 VIEW  COMPARISON:  One-view chest 11/28/2014. Two-view chest x-ray 12/26/2011.  FINDINGS:  Atherosclerotic changes are present at the aortic arch. The heart size is upper limits of normal. There is no edema or effusion to suggest failure. Mild emphysematous changes are noted. Exaggerated thoracic kyphosis is evident. The T9 vertebral plana compression fracture has progressed. A superior endplate fracture at T6 is age indeterminate.  IMPRESSION: 1. Stable mild cardiomegaly without failure. 2. Emphysema. 3. Progressive vertebral plana compression fracture at T9. 4. Question superior endplate fracture at T6, new since 2013.   Electronically Signed   By: Marin Roberts M.D.   On: 02/18/2015 12:14    EKG: Independently reviewed. Atrial fibrillation rate about 150 QRS axis -20 rate related changes with ST-T wave/J-point flattening however no inversions. PVC noted. Wondering baseline. Other than the A. fib normal  Assessment/Plan Active Problems:   Sepsis  New-onset A fibrillation, [place] score >4-probably secondary to sepsis physiology, DDX iatrogenic hyperthyoidism as on synthyroid Has already received 2 L of IV saline Saline 100 cc per hour Cardizem gtt. 5 mg to titrate to goal heart rate 120 Admit to stepdown Etiology? Hyperthyroid state-obtain TSH as is on levothyroxine Obtain echocardiogram Cardiology has been consulted Start Lovenox--?consider Elliquis.   HAS-BLED=3=5.8 % risk of GIB /yr  sepsis from urinary source-urine was in and out catch Start Rocephin IV every 24 hourly Lactic acidosis on admission probably multifactorial and secondary to sepsis versus AKI Trended lactic acid Trend Pro calcitonin Labs a.m. Hemodynamically is stable for stepdown  Acute kidney injury with metabolic acidosis and hypokalemia superimposed on chronic kidney disease stage IV Baseline creatinine = 2.0, GFR 25  Today GFR on admission is 49  Patient has a metabolic acidosis from this as well so that may contribute to the lactic acidosis with a bicarbonate of 19 We will correct underlying  abnormalities with IV saline and hopefully lactic acidosis will resolve I will start by mouth bicarbonate 650 twice a day We will start by mouth potassium 40 mEq twice a day We will check a magnesium Hold nephrotoxic agent-losartan 100 on hold  Multi-infarct dementia secondary to CVA 2010 -Monitor and may continue Aricept 5 mg  daily at bedtime, Namenda 10 mg twice a day -Reorient but her dementia is only mild  Recent hip fracture 11/2014 -We will need to therapy to evaluate her.  Probably will need skilled facility placement  Former smoker, possible COPD -Monitor for wheeze  Hypothyroidism on Synthroid -Obtain TSH  Prior history lung nodule needs outpatient surveillance according to guidelines  Restless leg syndrome -Anemia to be corrected aggressively  Bipolar On Prozac-Some evidence that higher mortality with this agent so consider trazodone as an outpatient?   Appt with PCP: pending Code Status: DNR Family Communication:updated family fully at bedside Disposition Plan: SNF vlikely DVT prophylaxis: scd's Consultants:  Procedures:    Rhetta Mura [facility] Pager [phone]  If 7PM-7AM, please contact night-coverage [email or web address] Password [email or web address] 02/18/2015, 2:19 PM

## 2015-02-18 NOTE — ED Provider Notes (Signed)
CSN: [id number]     Arrival date & time 02/18/15  1108 History   First MD Initiated Contact with Patient 02/18/15 1121     Chief Complaint  Patient presents with  . Weakness  . Urinary Frequency     (Consider location/radiation/quality/duration/timing/severity/associated sxs/prior Treatment) HPI  Patient is 81 year old female with dementia presenting today with altered mental status and weakness. Patient is unable to give any history. History gathered from family members at bedside. According to them patient has been having increasing trouble getting around, smelly urine and weakness.  Level V caveat dementia   Past Medical History  Diagnosis Date  . ALLERGIC RHINITIS 02/04/2007  . Altered mental status 11/05/2008  . ANEMIA-IRON DEFICIENCY 08/11/2008  . ANEMIA 06/30/2008  . ANXIETY 08/11/2008  . FREQUENCY, URINARY 04/28/2008  . GERD 02/04/2007  . HYPERTENSION 02/04/2007  . Hypopotassemia 11/08/2008  . HYPOTHYROIDISM 02/04/2007  . LOW BACK PAIN 12/31/2007  . OSTEOARTHRITIS 12/31/2007  . OSTEOPOROSIS 02/04/2007  . PRURITUS 06/03/2009  . RESTLESS LEG SYNDROME 03/18/2009  . Vascular dementia with delusions 11/25/2008  . WEAKNESS 11/05/2008  . WEIGHT LOSS 11/05/2008   Past Surgical History  Procedure Laterality Date  . Hemorrhoid surgery    . Abdominal hysterectomy    . Tonsillectomy    . Knee surgery      right  . Laminectomy    . Back surgery    . Tonsillectomy    . Knee arthroscopy      Right  . Appendectomy    . Lumbar laminectomy/decompression microdiscectomy  12/31/2011    Procedure: LUMBAR LAMINECTOMY/DECOMPRESSION MICRODISCECTOMY 1 LEVEL;  Surgeon: Cristi Loron, MD;  Location: [facility];  Service: Neurosurgery;  Laterality: Left;  Left Lumbar Four-Five Discectomy redo   . Femur im nail Left 11/28/2104  . Intramedullary (im) nail intertrochanteric Left 11/29/2014    Procedure: INTRAMEDULLARY (IM) NAIL INTERTROCHANTRIC;  Surgeon: Tarry Kos, MD;  Location: [facility] OR;   Service: Orthopedics;  Laterality: Left;   No family history on file. History  Substance Use Topics  . Smoking status: Former Smoker    Quit date: 07/16/1988  . Smokeless tobacco: Never Used  . Alcohol Use: No   OB History    No data available     Review of Systems  Unable to perform ROS: Dementia      Allergies  Ciprofloxacin; Diazepam; and Meperidine hcl  Home Medications   Prior to Admission medications   Medication Sig Start Date End Date Taking? Authorizing Provider  amLODipine (NORVASC) 10 MG tablet TAKE 1 TABLET BY MOUTH EVERY DAY 10/07/14   Gordy Savers, MD  aspirin 81 MG tablet Take 81 mg by mouth daily.      Historical Provider, MD  benzonatate (TESSALON) 100 MG capsule Take 1 capsule (100 mg total) by mouth 2 (two) times daily as needed for cough. Patient not taking: Reported on 11/28/2014 01/28/14   Gordy Savers, MD  Calcium Carbonate-Vitamin D (CALCIUM + D PO) Take 1 tablet by mouth 2 (two) times daily.    Historical Provider, MD  cephALEXin (KEFLEX) 250 MG capsule Take 1 capsule (250 mg total) by mouth 2 (two) times daily. 12/03/14   Costin Otelia Sergeant, MD  diphenhydrAMINE (BENADRYL) 25 MG tablet Take 25 mg by mouth as needed.    Historical Provider, MD  donepezil (ARICEPT) 5 MG tablet TAKE 1 TABLET BY MOUTH AT BEDTIME 01/06/15   Gordy Savers, MD  enoxaparin (LOVENOX) 40 MG/0.4ML injection Inject 0.4 mLs (40  mg total) into the skin daily. 11/29/14   Tarry Kos, MD  FLUoxetine (PROZAC) 40 MG capsule TAKE ONE CAPSULE BY MOUTH EVERY DAY 10/07/14   Gordy Savers, MD  HYDROcodone-acetaminophen (NORCO/VICODIN) 5-325 MG per tablet Take 1-2 tablets by mouth every 6 (six) hours as needed for moderate pain. 12/03/14   Costin Otelia Sergeant, MD  levothyroxine (SYNTHROID, LEVOTHROID) 50 MCG tablet TAKE 1 TABLET BY MOUTH EVERY DAY 01/28/14   Gordy Savers, MD  losartan (COZAAR) 100 MG tablet TAKE 1 TABLET BY MOUTH EVERY DAY 12/21/14   Gordy Savers, MD   losartan (COZAAR) 100 MG tablet TAKE 1 TABLET BY MOUTH EVERY DAY 01/18/15   Gordy Savers, MD  memantine (NAMENDA) 10 MG tablet TAKE 1 TABLET BY MOUTH TWICE A DAY 08/18/14   Gordy Savers, MD  oxyCODONE (OXY IR/ROXICODONE) 5 MG immediate release tablet Take 1-3 tablets (5-15 mg total) by mouth every 4 (four) hours as needed. 11/29/14   Tarry Kos, MD  ranitidine (ZANTAC) 75 MG tablet Take 75 mg by mouth daily.    Historical Provider, MD   BP 129/68 mmHg  Pulse 133  Temp(Src) 97.4 F (36.3 C) (Oral)  Resp 22  SpO2 100% Physical Exam  Constitutional: She is oriented to person, place, and time. She appears well-developed and well-nourished.  HENT:  Head: Normocephalic and atraumatic.  Dry mucous membranes. Mild discharge in right eye.  Eyes: Conjunctivae are normal. Right eye exhibits no discharge.  Neck: Neck supple.  Cardiovascular: Normal rate, regular rhythm and normal heart sounds.   No murmur heard. Pulmonary/Chest: Effort normal and breath sounds normal. She has no wheezes. She has no rales.  Abdominal: Soft. She exhibits no distension. There is no tenderness.  Musculoskeletal: Normal range of motion. She exhibits no edema.  Neurological: She is oriented to person, place, and time. No cranial nerve deficit.  Skin: Skin is warm and dry. No rash noted. She is not diaphoretic.  Psychiatric: She has a normal mood and affect. Her behavior is normal.  Nursing note and vitals reviewed.   ED Course  Procedures (including critical care time) Labs Review Labs Reviewed  BASIC METABOLIC PANEL  CBC WITH DIFFERENTIAL/PLATELET  URINALYSIS, ROUTINE W REFLEX MICROSCOPIC (NOT AT [facility])  I-STAT TROPOININ, ED  I-STAT CG4 LACTIC ACID, ED    Imaging Review No results found.   EKG Interpretation None      MDM   Final diagnoses:  None    Patient is a 81 year old female presented today with tachycardia and symptoms of not feeling well. Patient's demented unable to give a  history. However according to daughter she has been increasingly weak at home. Patient was living with her daughter after a hip replacement  up until a couple weeks ago. Now she is back to living on her own. According to family she has not been excelling at home. I think patient is likely septic, and will require  admission to step down unit.  Lactic pending, source of infection pending at this point but likely urine.  Patient had a cardiac likely from dehydration. We'll fluid resuscitate.   Patient in new onset A. fib. We will give 10 of IV diltiazam chased by 30th by mouth dilt if improvement  CRITICAL CARE Performed by: Arlana Hove   Total critical care time: 30  Critical care time was exclusive of separately billable procedures and treating other patients.  Critical care was necessary to treat or prevent imminent or life-threatening  deterioration.  Critical care was time spent personally by me on the following activities: development of treatment plan with patient and/or surrogate as well as nursing, discussions with consultants, evaluation of patient's response to treatment, examination of patient, obtaining history from patient or surrogate, ordering and performing treatments and interventions, ordering and review of laboratory studies, ordering and review of radiographic studies, pulse oximetry and re-evaluation of patient's condition.   Carmen Tolliver Randall An, MD 02/18/15 [phone]

## 2015-02-18 NOTE — Progress Notes (Addendum)
CRITICAL VALUE ALERT  Critical value received:  Lactic acid 2.1  Date of notification:  02/18/2015   Time of notification:  9:24 PM   Critical value read back:Yes.    Nurse who received alert:  Reap, Randon Goldsmith   MD notified (1st page):  TRH  Time of first page:  9:24 PM   MD notified (2nd page):  Time of second page:  Responding MD:  TRH  Time MD responded:  9:29 PM  No new orders

## 2015-02-18 NOTE — Progress Notes (Signed)
CRITICAL VALUE ALERT  Critical value received:  K 2.3  Date of notification:  02/18/2015   Time of notification:  0805  Critical value read back:Yes.    Nurse who received alert:  Tyler Pita, RN  MD notified (1st page):  TRH  Time of first page:  0806  MD notified (2nd page):  Time of second page:  Responding MD:    Time MD responded:

## 2015-02-18 NOTE — ED Notes (Signed)
Per ems, daughter said patient fell last night with no injuires. EKG shows sinus tach with a rate of 100-150.

## 2015-02-18 NOTE — ED Notes (Signed)
Pt returned from xray

## 2015-02-19 LAB — C DIFFICILE QUICK SCREEN W PCR REFLEX
C DIFFICILE (CDIFF) INTERP: NEGATIVE
C DIFFICILE (CDIFF) TOXIN: NEGATIVE
C DIFFICLE (CDIFF) ANTIGEN: NEGATIVE

## 2015-02-19 LAB — PROCALCITONIN: PROCALCITONIN: 0.22 ng/mL

## 2015-02-19 LAB — BASIC METABOLIC PANEL
ANION GAP: 8 (ref 5–15)
BUN: 21 mg/dL — ABNORMAL HIGH (ref 6–20)
CALCIUM: 7.5 mg/dL — AB (ref 8.9–10.3)
CO2: 18 mmol/L — ABNORMAL LOW (ref 22–32)
CREATININE: 1.98 mg/dL — AB (ref 0.44–1.00)
Chloride: 118 mmol/L — ABNORMAL HIGH (ref 101–111)
GFR calc Af Amer: 26 mL/min — ABNORMAL LOW (ref >60)
GFR, EST NON AFRICAN AMERICAN: 22 mL/min — AB (ref >60)
Glucose, Bld: 95 mg/dL (ref 65–99)
Potassium: 3 mmol/L — ABNORMAL LOW (ref 3.5–5.1)
SODIUM: 144 mmol/L (ref 135–145)

## 2015-02-19 LAB — PROTIME-INR
INR: 1.45 (ref 0.00–1.49)
Prothrombin Time: 17.8 seconds — ABNORMAL HIGH (ref 11.6–15.2)

## 2015-02-19 LAB — TROPONIN I: Troponin I: 0.05 ng/mL — ABNORMAL HIGH (ref <0.031)

## 2015-02-19 MED ORDER — DILTIAZEM HCL 60 MG PO TABS
60.0000 mg | ORAL_TABLET | Freq: Four times a day (QID) | ORAL | Status: DC
  Administered 2015-02-19 – 2015-02-22 (×13): 60 mg via ORAL
  Filled 2015-02-19 (×15): qty 1

## 2015-02-19 MED ORDER — POTASSIUM CHLORIDE 10 MEQ/100ML IV SOLN
10.0000 meq | INTRAVENOUS | Status: AC
  Administered 2015-02-19 (×3): 10 meq via INTRAVENOUS
  Filled 2015-02-19 (×3): qty 100

## 2015-02-19 MED ORDER — PROMETHAZINE HCL 25 MG/ML IJ SOLN
12.5000 mg | Freq: Four times a day (QID) | INTRAMUSCULAR | Status: DC | PRN
  Administered 2015-02-19 – 2015-02-20 (×3): 12.5 mg via INTRAVENOUS
  Filled 2015-02-19 (×3): qty 1

## 2015-02-19 MED ORDER — POTASSIUM CHLORIDE CRYS ER 20 MEQ PO TBCR
40.0000 meq | EXTENDED_RELEASE_TABLET | Freq: Every day | ORAL | Status: DC
  Administered 2015-02-20 – 2015-02-22 (×3): 40 meq via ORAL
  Filled 2015-02-19 (×3): qty 2

## 2015-02-19 NOTE — Progress Notes (Signed)
Utilization Review completed. Schwanda Zima RN BSN CM 

## 2015-02-19 NOTE — Evaluation (Signed)
Physical Therapy Evaluation Patient Details Name: Jennifer [name] MRN: [id number] DOB: 10-31-1928 Today's Date: 02/19/2015   History of Present Illness  81 y/o ? h/o RLS, former chronic smoker about 35 pack years quit 1990s, multi-infarct dementia secondary to multiple to CVA-2010?, hypothyroid, HTN, A/CKD stg IV, recent comminuted intertrochanteric left hip fracture S/P repair 11/29/14, previous L4-5 herniated disc status post repair 12/22/11, 5 mm LLL nodule with risk of bronchogenic cancer  Clinical Impression  Patient presents with dependencies in mobility, balance and safety due to above medical problems.  Feel patient will steadily improve with mobility, however, feel patient will need more inpatient therapy before considering return home alone.  Uncertain if cognitive status will allow patient to return home alone.  Patient somewhat impulsive today and  unaware she was having bowel movement.  Will benefit from continued PT to progress mobility and improve independence.    Follow Up Recommendations SNF    Equipment Recommendations  None recommended by PT    Recommendations for Other Services       Precautions / Restrictions Precautions Precautions: Fall      Mobility  Bed Mobility Overal bed mobility: Modified Independent             General bed mobility comments: used railing  Transfers Overall transfer level: Needs assistance Equipment used: Rolling walker (2 wheeled) Transfers: Sit to/from Stand Sit to Stand: Min assist         General transfer comment: slight assist to power up, guarding for safety  Ambulation/Gait Ambulation/Gait assistance: Min assist Ambulation Distance (Feet): 5 Feet Assistive device: Rolling walker (2 wheeled)       General Gait Details: patient took a few steps and then tried to sit down in chair which was quite a distance from her.  Redirected to remain standing until chair behind her.  once chair was about 2 feet away, patient just  began to sit down; therapist/tech able to get chair to patient in time.  Stairs            Wheelchair Mobility    Modified Rankin (Stroke Patients Only)       Balance Overall balance assessment: Needs assistance Sitting-balance support: No upper extremity supported Sitting balance-Leahy Scale: Fair     Standing balance support: Bilateral upper extremity supported Standing balance-Leahy Scale: Poor Standing balance comment: required RW to maintain standing balance                             Pertinent Vitals/Pain Pain Assessment: No/denies pain    Home Living Family/patient expects to be discharged to:: Private residence Living Arrangements: Alone Available Help at Discharge: Family;Available PRN/intermittently Type of Home: House Home Access: Ramped entrance     Home Layout: One level Home Equipment: Walker - 4 wheels;Toilet riser;Bedside commode;Shower seat;Walker - 2 wheels Additional Comments: After convalescence from her hip fracture she went to [facility] facility for about a month and then went to stay with her daughter [name]. She was recently taken back home by her family and has been staying alone at home. She has been doing some of her ADLs and IADLs independently and walks with a walker and a cane but fell yesterday    Prior Function Level of Independence: Independent with assistive device(s)               Hand Dominance   Dominant Hand: Right    Extremity/Trunk Assessment   Upper Extremity Assessment: Generalized  weakness           Lower Extremity Assessment: Generalized weakness      Cervical / Trunk Assessment: Kyphotic  Communication   Communication: No difficulties  Cognition Arousal/Alertness: Awake/alert Behavior During Therapy: Anxious;Impulsive Overall Cognitive Status: History of cognitive impairments - at baseline (per son)                      General Comments      Exercises         Assessment/Plan    PT Assessment Patient needs continued PT services  PT Diagnosis Difficulty walking;Generalized weakness   PT Problem List Decreased activity tolerance;Decreased balance;Decreased mobility;Decreased cognition;Decreased knowledge of use of DME;Decreased safety awareness  PT Treatment Interventions Gait training;DME instruction;Functional mobility training;Therapeutic activities;Therapeutic exercise;Cognitive remediation;Patient/family education;Balance training   PT Goals (Current goals can be found in the Care Plan section) Acute Rehab PT Goals Patient Stated Goal: go back home PT Goal Formulation: With patient/family Time For Goal Achievement: 03/05/15 Potential to Achieve Goals: Good    Frequency Min 3X/week   Barriers to discharge Decreased caregiver support lives alone    Co-evaluation               End of Session Equipment Utilized During Treatment: Gait belt Activity Tolerance: Patient limited by fatigue Patient left: in chair;with family/visitor present;with call bell/phone within reach           Time: 1008-1032 PT Time Calculation (min) (ACUTE ONLY): 24 min   Charges:   PT Evaluation $Initial PT Evaluation Tier I: 1 Procedure PT Treatments $Gait Training: 8-22 mins   PT G CodesOlivia Canter 02/19/2015, 10:56 AM  02/19/2015 Corlis Hove, PT [phone]

## 2015-02-19 NOTE — Progress Notes (Signed)
[facility] TEAM 1 - Stepdown/ICU TEAM Progress Note  Jennifer [name]:[id number] DOB: 1929/04/28 DOA: 02/18/2015 PCP: Rogelia Boga, MD  Admit HPI / Brief Narrative: 81 y/o ? w/ a h/o RLS, former smoker, multi-infarct dementia, hypothyroid, HTN, CKD stg IV, recent comminuted intertrochanteric left hip fracture S/P repair 11/29/14, previous L4-5 herniated disc status post repair 12/22/11, 5 mm LLL nodule with risk of bronchogenic cancer who presented with falls and was found to have rapid A. fib in the emergency room/    After tx for her hip fracture she went to [facility] for about a month and then went to stay with her daughter [name]. She was recently taken back home by her family and has been staying alone at home. She walks with a walker and a cane.    HPI/Subjective: The patient was quite uncomfortable in bed.  She tells me she is experiencing lower abdominal cramps and severe nausea.  She denies chest pain or shortness of breath.  She denies joint pain or focal neurologic deficits.  Assessment/Plan:  New-onset A fibrillation w/ RVR CHA2DS2 - VASc 6 - rate now well controlled - transitioned off IV Cardizem - continued to gently hydrate and follow on telemetry  Sepsis due to UTI Sepsis physiology improving rapidly - continue to gently hydrate -  follow urine culture  Acute kidney injury superimposed on CKD stage IV appears to be prerenal  - continue to hydrate and follow  Hypokalemia  replace per IV as patient not likely able to tolerate oral  - follow trend and check magnesium  Multi-infarct dementia  Hip fracture 11/2014  Hypothyroidism on Synthroid TSH is 0.565  5mm LLL lung nodule - noted on CT 2012 follow-up scanning could be pursued for prognostic purposes but patient clearly would not be a candidate for aggressive cancer treatment  RLS  Bipolar  Code Status: NO CODE BLUE Family Communication: no family present at time of exam Disposition Plan:  SDU  Consultants: [facility]   Procedures: none  Antibiotics: Rocephin 8/5 >  DVT prophylaxis: lovenox   Objective: Blood pressure 108/69, pulse 77, temperature 97.6 F (36.4 C), temperature source Oral, resp. rate 27, height 5\' 6"  (1.676 m), weight 69.5 kg (153 lb 3.5 oz), SpO2 100 %.  Intake/Output Summary (Last 24 hours) at 02/19/15 1519 Last data filed at 02/19/15 1341  Gross per 24 hour  Intake 2526.92 ml  Output      0 ml  Net 2526.92 ml   Exam: General: No acute respiratory distress - appears anxious and ill  Lungs: Clear to auscultation bilaterally without wheezes or crackles Cardiovascular: irreg irreg but rate controlled at 80 w/o appreciable M Abdomen: Nontender, nondistended, soft, bowel sounds positive, no rebound, no ascites, no appreciable mass Extremities: No significant cyanosis, clubbing, or edema bilateral lower extremities  Data Reviewed: Basic Metabolic Panel:  Recent Labs Lab 02/18/15 1211 02/18/15 1852 02/19/15 0750  NA 147* 145 144  K 2.8* 2.3* 3.0*  CL 113* 116* 118*  CO2 19* 22 18*  GLUCOSE 101* 121* 95  BUN 26* 23* 21*  CREATININE 2.53* 2.21* 1.98*  CALCIUM 8.5* 8.0* 7.5*  MG 1.8  --   --     CBC:  Recent Labs Lab 02/18/15 1211 02/18/15 1852  WBC 11.5* 10.0  NEUTROABS 9.6* 8.5*  HGB 13.0 12.3  HCT 39.5 37.5  MCV 89.2 89.1  PLT 263 220    Liver Function Tests:  Recent Labs Lab 02/18/15 1852  AST 20  ALT  12*  ALKPHOS 175*  BILITOT 0.5  PROT 5.1*  ALBUMIN 2.0*    Coags:  Recent Labs Lab 02/18/15 1852 02/19/15 0508  INR 1.20 1.45    Recent Labs Lab 02/18/15 1852  APTT 24    Cardiac Enzymes:  Recent Labs Lab 02/18/15 1852 02/18/15 2234 02/19/15 0508  TROPONINI 0.06* 0.05* 0.05*    CBG: No results for input(s): GLUCAP in the last 168 hours.  Recent Results (from the past 240 hour(s))  MRSA PCR Screening     Status: None   Collection Time: 02/18/15  5:21 PM  Result Value Ref Range  Status   MRSA by PCR NEGATIVE NEGATIVE Final    Comment:        The GeneXpert MRSA Assay (FDA approved for NASAL specimens only), is one component of a comprehensive MRSA colonization surveillance program. It is not intended to diagnose MRSA infection nor to guide or monitor treatment for MRSA infections.   C difficile quick scan w PCR reflex     Status: None   Collection Time: 02/19/15  2:01 AM  Result Value Ref Range Status   C Diff antigen NEGATIVE NEGATIVE Final   C Diff toxin NEGATIVE NEGATIVE Final   C Diff interpretation Negative for toxigenic C. difficile  Final     Studies:   Recent x-ray studies have been reviewed in detail by the Attending Physician  Scheduled Meds:  Scheduled Meds: . aspirin EC  81 mg Oral Daily  . cefTRIAXone (ROCEPHIN)  IV  1 g Intravenous Q24H  . diltiazem  60 mg Oral 4 times per day  . donepezil  5 mg Oral QHS  . enoxaparin (LOVENOX) injection  1 mg/kg Subcutaneous Q24H  . FLUoxetine  40 mg Oral Daily  . levothyroxine  50 mcg Oral QAC breakfast  . memantine  10 mg Oral BID  . potassium chloride  40 mEq Oral BID  . sodium bicarbonate  650 mg Oral BID    Time spent on care of this patient: 35 mins   MCCLUNG,JEFFREY T , MD   [facility] Office  [phone] Pager - Text Page per Loretha Stapler as per below:  On-Call/Text Page:      [email or web address]      password [email or web address]  If 7PM-7AM, please contact night-coverage [email or web address] Password [email or web address] 02/19/2015, 3:19 PM   LOS: 1 day

## 2015-02-20 LAB — COMPREHENSIVE METABOLIC PANEL
ALT: 13 U/L — AB (ref 14–54)
AST: 23 U/L (ref 15–41)
Albumin: 1.8 g/dL — ABNORMAL LOW (ref 3.5–5.0)
Alkaline Phosphatase: 138 U/L — ABNORMAL HIGH (ref 38–126)
Anion gap: 7 (ref 5–15)
BILIRUBIN TOTAL: 0.4 mg/dL (ref 0.3–1.2)
BUN: 15 mg/dL (ref 6–20)
CO2: 19 mmol/L — AB (ref 22–32)
Calcium: 7.4 mg/dL — ABNORMAL LOW (ref 8.9–10.3)
Chloride: 116 mmol/L — ABNORMAL HIGH (ref 101–111)
Creatinine, Ser: 1.67 mg/dL — ABNORMAL HIGH (ref 0.44–1.00)
GFR calc Af Amer: 32 mL/min — ABNORMAL LOW (ref >60)
GFR calc non Af Amer: 27 mL/min — ABNORMAL LOW (ref >60)
GLUCOSE: 77 mg/dL (ref 65–99)
Potassium: 3.6 mmol/L (ref 3.5–5.1)
Sodium: 142 mmol/L (ref 135–145)
TOTAL PROTEIN: 4.8 g/dL — AB (ref 6.5–8.1)

## 2015-02-20 LAB — CBC
HCT: 33.9 % — ABNORMAL LOW (ref 36.0–46.0)
Hemoglobin: 10.7 g/dL — ABNORMAL LOW (ref 12.0–15.0)
MCH: 28.5 pg (ref 26.0–34.0)
MCHC: 31.6 g/dL (ref 30.0–36.0)
MCV: 90.4 fL (ref 78.0–100.0)
Platelets: 223 10*3/uL (ref 150–400)
RBC: 3.75 MIL/uL — ABNORMAL LOW (ref 3.87–5.11)
RDW: 16.3 % — AB (ref 11.5–15.5)
WBC: 8.9 10*3/uL (ref 4.0–10.5)

## 2015-02-20 LAB — MAGNESIUM: Magnesium: 1.4 mg/dL — ABNORMAL LOW (ref 1.7–2.4)

## 2015-02-20 MED ORDER — CETYLPYRIDINIUM CHLORIDE 0.05 % MT LIQD
7.0000 mL | Freq: Two times a day (BID) | OROMUCOSAL | Status: DC
Start: 2015-02-20 — End: 2015-02-22
  Administered 2015-02-20 – 2015-02-22 (×5): 7 mL via OROMUCOSAL

## 2015-02-20 MED ORDER — MAGNESIUM SULFATE 2 GM/50ML IV SOLN
2.0000 g | Freq: Once | INTRAVENOUS | Status: AC
Start: 2015-02-20 — End: 2015-02-20
  Administered 2015-02-20: 2 g via INTRAVENOUS

## 2015-02-20 MED ORDER — CHLORHEXIDINE GLUCONATE 0.12% ORAL RINSE (MEDLINE KIT)
15.0000 mL | Freq: Two times a day (BID) | OROMUCOSAL | Status: DC
  Administered 2015-02-20 – 2015-02-22 (×5): 15 mL via OROMUCOSAL

## 2015-02-20 NOTE — Progress Notes (Signed)
[facility] TEAM 1 - Stepdown/ICU TEAM Progress Note  Jennifer [name] AVW:[id number] DOB: 1928-08-10 DOA: 02/18/2015 PCP: Rogelia Boga, MD  Admit HPI / Brief Narrative: 80 y/o ? w/ a h/o RLS, former smoker, multi-infarct dementia, hypothyroid, HTN, CKD stg IV, recent comminuted intertrochanteric left hip fracture S/P repair 11/29/14, previous L4-5 herniated disc status post repair 12/22/11, 5 mm LLL nodule with risk of bronchogenic cancer who presented with falls and was found to have rapid A. fib in the emergency room.    After tx for her hip fracture she went to [facility] for about a month and then went to stay with her daughter [name]. She was recently taken back home by her family and has been staying alone at home. She walks with a walker and a cane.    HPI/Subjective: The patient is much more alert today.  She can tell me where she is and why she is here.  She remembers her recent hip surgery.  She denies chest pain shortness of breath fevers chills nausea or vomiting.  Assessment/Plan:  New-onset A fibrillation w/ RVR CHA2DS2 - VASc 6 - rate now well controlled - transitioned off IV Cardizem - continue to gently hydrate and follow on telemetry - continue full dose Lovenox for anticoagulation at this time - ambulate with PT/OT to determine fall risk and to assist in determining advisability of initiating long-term oral anticoagulation   Sepsis due to UTI Sepsis physiology improving rapidly - continue to gently hydrate -  urine culture still pending - narrow antibiotic regimen as soon as information available to allow [place] to do so  Acute kidney injury superimposed on CKD stage IV appears to be prerenal  - continues to improve with volume expansion - follow-up in a.m.  Hypokalemia  Improving with replacement - continue to follow  Hypomagnesemia Replace with goal of 2.0  Multi-infarct dementia Mental status much improved today with above detailed treatment  Hip fracture  11/2014 No evidence of current complications - begin PT/OT in a.m.  Hypothyroidism on Synthroid TSH is 0.565  5mm LLL lung nodule - noted on CT 2012 follow-up scanning could be pursued for prognostic purposes but patient clearly would not be a candidate for aggressive cancer treatment  RLS  Bipolar  Code Status: NO CODE BLUE Family Communication: Spoke with daughter/multiple other family members at bedside Disposition Plan: Stable for transfer to telemetry bed - continue to gently hydrate - begin PT/OT - follow potassium and magnesium - follow up urine culture  Consultants: [facility]   Procedures: none  Antibiotics: Rocephin 8/5 >  DVT prophylaxis: lovenox   Objective: Blood pressure 113/85, pulse 90, temperature 99.4 F (37.4 C), temperature source Oral, resp. rate 19, height  (1.676 m), weight 69.5 kg (153 lb 3.5 oz), SpO2 95 %.  Intake/Output Summary (Last 24 hours) at 02/20/15 1529 Last data filed at 02/20/15 0800  Gross per 24 hour  Intake 2897.25 ml  Output      0 ml  Net 2897.25 ml   Exam: General: No acute respiratory distress - calm and appropriate and oriented Lungs: Clear to auscultation bilaterally without wheezes / crackles Cardiovascular: Regular rate and rhythm without murmur gallop or appreciable rub Abdomen: Nontender, nondistended, soft, bowel sounds positive, no rebound, no ascites, no appreciable mass Extremities: No significant cyanosis, clubbing;  1+ edema bilateral lower extremities  Data Reviewed: Basic Metabolic Panel:  Recent Labs Lab 02/18/15 1211 02/18/15 1852 02/19/15 0750 02/20/15 0321  NA 147* 145 144 142  K 2.8* 2.3* 3.0* 3.6  CL 113* 116* 118* 116*  CO2 19* 22 18* 19*  GLUCOSE 101* 121* 95 77  BUN 26* 23* 21* 15  CREATININE 2.53* 2.21* 1.98* 1.67*  CALCIUM 8.5* 8.0* 7.5* 7.4*  MG 1.8  --   --  1.4*    CBC:  Recent Labs Lab 02/18/15 1211 02/18/15 1852 02/20/15 0321  WBC 11.5* 10.0 8.9  NEUTROABS 9.6*  8.5*  --   HGB 13.0 12.3 10.7*  HCT 39.5 37.5 33.9*  MCV 89.2 89.1 90.4  PLT 263 220 223    Liver Function Tests:  Recent Labs Lab 02/18/15 1852 02/20/15 0321  AST 20 23  ALT 12* 13*  ALKPHOS 175* 138*  BILITOT 0.5 0.4  PROT 5.1* 4.8*  ALBUMIN 2.0* 1.8*    Coags:  Recent Labs Lab 02/18/15 1852 02/19/15 0508  INR 1.20 1.45    Recent Labs Lab 02/18/15 1852  APTT 24    Cardiac Enzymes:  Recent Labs Lab 02/18/15 1852 02/18/15 2234 02/19/15 0508  TROPONINI 0.06* 0.05* 0.05*    Recent Results (from the past 240 hour(s))  Culture, blood (x 2)     Status: None (Preliminary result)   Collection Time: 02/18/15 12:36 PM  Result Value Ref Range Status   Specimen Description BLOOD RIGHT ANTECUBITAL  Final   Special Requests BOTTLES DRAWN AEROBIC AND ANAEROBIC 5CC  Final   Culture NO GROWTH 2 DAYS  Final   Report Status PENDING  Incomplete  Culture, blood (x 2)     Status: None (Preliminary result)   Collection Time: 02/18/15 12:41 PM  Result Value Ref Range Status   Specimen Description BLOOD RIGHT HAND  Final   Special Requests BOTTLES DRAWN AEROBIC ONLY 5CC  Final   Culture NO GROWTH 2 DAYS  Final   Report Status PENDING  Incomplete  MRSA PCR Screening     Status: None   Collection Time: 02/18/15  5:21 PM  Result Value Ref Range Status   MRSA by PCR NEGATIVE NEGATIVE Final    Comment:        The GeneXpert MRSA Assay (FDA approved for NASAL specimens only), is one component of a comprehensive MRSA colonization surveillance program. It is not intended to diagnose MRSA infection nor to guide or monitor treatment for MRSA infections.   C difficile quick scan w PCR reflex     Status: None   Collection Time: 02/19/15  2:01 AM  Result Value Ref Range Status   C Diff antigen NEGATIVE NEGATIVE Final   C Diff toxin NEGATIVE NEGATIVE Final   C Diff interpretation Negative for toxigenic C. difficile  Final     Studies:   Recent x-ray studies have been  reviewed in detail by the Attending Physician  Scheduled Meds:  Scheduled Meds: . antiseptic oral rinse  7 mL Mouth Rinse q12n4p  . aspirin EC  81 mg Oral Daily  . cefTRIAXone (ROCEPHIN)  IV  1 g Intravenous Q24H  . chlorhexidine gluconate  15 mL Mouth Rinse BID  . diltiazem  60 mg Oral 4 times per day  . donepezil  5 mg Oral QHS  . enoxaparin (LOVENOX) injection  1 mg/kg Subcutaneous Q24H  . FLUoxetine  40 mg Oral Daily  . levothyroxine  50 mcg Oral QAC breakfast  . memantine  10 mg Oral BID  . potassium chloride  40 mEq Oral Daily  . sodium bicarbonate  650 mg Oral BID    Time spent on care  of this patient: 35 mins   Herschel Fleagle T , MD   [facility] Office  [phone] Pager - Text Page per Loretha Stapler as per below:  On-Call/Text Page:      [email or web address]      password [email or web address]  If 7PM-7AM, please contact night-coverage [email or web address] Password [email or web address] 02/20/2015, 3:29 PM   LOS: 2 days

## 2015-02-20 NOTE — Progress Notes (Signed)
Jennifer [name] [id number]  Transfer Data: 02/20/2015 6:16 PM  Attending Provider: Lonia Blood, MD  HQI:[id number] Homero Fellers, MD  Code Status: DNR  Jennifer [name] is a 81 y.o. female patient transferred from [facility]  -No acute distress noted.  -No complaints of shortness of breath.  -No complaints of chest pain.  Cardiac Monitoring:  Box # 19 in place.  Cardiac monitor yields:normal sinus rhythm.  Blood pressure 123/92, pulse 88, temperature 99.4 F (37.4 C), temperature source Oral, resp. rate 19, height  (1.676 m), weight 69.5 kg (153 lb 3.5 oz), SpO2 96 %.  ?  IV Fluids: IV in place, occlusive dsg intact without redness, IV cath antecubital right, condition patent and no redness  normal saline.  Allergies: Ciprofloxacin; Diazepam; and Meperidine hcl  Past Medical History:  has a past medical history of ALLERGIC RHINITIS (02/04/2007); ANEMIA-IRON DEFICIENCY (08/11/2008); ANXIETY (08/11/2008); FREQUENCY, URINARY (04/28/2008); GERD (02/04/2007); Hypopotassemia (11/08/2008); LOW BACK PAIN (12/31/2007); OSTEOARTHRITIS (12/31/2007); OSTEOPOROSIS (02/04/2007); PRURITUS (06/03/2009); Restless leg syndrome; Vascular dementia with delusions (11/25/2008); WEIGHT LOSS (11/05/2008); TIA (transient ischemic attack); Former tobacco use; Hypothyroidism; Hypertension; CKD (chronic kidney disease), stage IV; Lung nodule; Hyperlipidemia; Frequent falls; Closed comminuted intertrochanteric fracture of proximal femur; and Pericardial effusion.  Past Surgical History:  has past surgical history that includes Hemorrhoid surgery; Abdominal hysterectomy; Tonsillectomy; Knee surgery; Laminectomy; Back surgery; Tonsillectomy; Knee arthroscopy; Appendectomy; Lumbar laminectomy/decompression microdiscectomy (12/31/2011); Femur IM nail (Left, 11/28/2104); and Intramedullary (im) nail intertrochanteric (Left, 11/29/2014).  Social History:  reports that she quit smoking about 26 years ago. She has never used smokeless tobacco. She  reports that she does not drink alcohol or use illicit drugs.    Patient/Family orientated to room. Admission inpatient armband information verified with patient/family to include name and date of birth and placed on patient arm. Side rails up x 2, fall assessment and education completed with patient/family. Patient/family able to verbalize understanding of risk associated with falls and verbalized understanding to call for assistance before getting out of bed. Call light within reach. Patient/family able to voice and demonstrate understanding of unit orientation instructions.  Will continue to evaluate and treat per MD orders.

## 2015-02-20 NOTE — Progress Notes (Signed)
Patient in sinus rhythm now.  Dementia noted. High CHAD2DSVASC scoe and would benefit from anticoagulation if deemed safe.  Discussed with Dr. Sharon Seller.  Darden Palmer. MD [facility] 02/20/2015 3:51 PM

## 2015-02-21 ENCOUNTER — Encounter (HOSPITAL_COMMUNITY): Payer: Self-pay | Admitting: Physician Assistant

## 2015-02-21 DIAGNOSIS — F0151 Vascular dementia with behavioral disturbance: Secondary | ICD-10-CM

## 2015-02-21 DIAGNOSIS — IMO0001 Reserved for inherently not codable concepts without codable children: Secondary | ICD-10-CM | POA: Insufficient documentation

## 2015-02-21 DIAGNOSIS — F22 Delusional disorders: Secondary | ICD-10-CM

## 2015-02-21 DIAGNOSIS — K921 Melena: Secondary | ICD-10-CM

## 2015-02-21 LAB — CBC
HCT: 37.7 % (ref 36.0–46.0)
Hemoglobin: 11.8 g/dL — ABNORMAL LOW (ref 12.0–15.0)
MCH: 28.9 pg (ref 26.0–34.0)
MCHC: 31.3 g/dL (ref 30.0–36.0)
MCV: 92.4 fL (ref 78.0–100.0)
Platelets: 222 10*3/uL (ref 150–400)
RBC: 4.08 MIL/uL (ref 3.87–5.11)
RDW: 16.6 % — ABNORMAL HIGH (ref 11.5–15.5)
WBC: 8.2 10*3/uL (ref 4.0–10.5)

## 2015-02-21 LAB — MAGNESIUM: Magnesium: 1.9 mg/dL (ref 1.7–2.4)

## 2015-02-21 LAB — BASIC METABOLIC PANEL
Anion gap: 6 (ref 5–15)
BUN: 11 mg/dL (ref 6–20)
CALCIUM: 7.5 mg/dL — AB (ref 8.9–10.3)
CO2: 20 mmol/L — AB (ref 22–32)
Chloride: 117 mmol/L — ABNORMAL HIGH (ref 101–111)
Creatinine, Ser: 1.47 mg/dL — ABNORMAL HIGH (ref 0.44–1.00)
GFR calc non Af Amer: 32 mL/min — ABNORMAL LOW (ref >60)
GFR, EST AFRICAN AMERICAN: 37 mL/min — AB (ref >60)
GLUCOSE: 89 mg/dL (ref 65–99)
Potassium: 3.7 mmol/L (ref 3.5–5.1)
Sodium: 143 mmol/L (ref 135–145)

## 2015-02-21 MED ORDER — METOPROLOL TARTRATE 25 MG PO TABS
25.0000 mg | ORAL_TABLET | Freq: Two times a day (BID) | ORAL | Status: DC
  Administered 2015-02-21 – 2015-02-22 (×2): 25 mg via ORAL
  Filled 2015-02-21 (×2): qty 1

## 2015-02-21 MED ORDER — METOPROLOL TARTRATE 25 MG PO TABS
25.0000 mg | ORAL_TABLET | Freq: Two times a day (BID) | ORAL | Status: DC
Start: 2015-02-21 — End: 2015-02-21

## 2015-02-21 NOTE — Progress Notes (Addendum)
PROGRESS NOTE  Jennifer [name] RUE:[id number] DOB: April 21, 1929 DOA: 02/18/2015 PCP: Jennifer Boga, MD  Admit HPI / Brief Narrative: 81 y/o ? w/ a h/o RLS, former smoker, multi-infarct dementia, hypothyroid, HTN, CKD stg IV, recent comminuted intertrochanteric left hip fracture S/P repair 11/29/14, previous L4-5 herniated disc status post repair 12/22/11, 5 mm LLL nodule with risk of bronchogenic cancer who presented with falls and was found to have rapid A. fib in the emergency room.   After tx for her hip fracture she went to [facility] for about a month and then went to stay with her daughter Jennifer [name]. She was recently taken back home by her family and has been staying alone at home. She walks with a walker and a cane.   HPI/Subjective: The patient is alert today, but confused. She cannot tell me where she is and why she is here. She remembers her recent hip surgery. She denies chest pain shortness of breath fevers chills nausea or vomiting, abdominal pain, chest pain, headache.  Assessment/Plan:  New-onset A fibrillation w/ RVR CHA2DS2 - VASc 6 - rate now well controlled - - transitioned off IV Cardizem-->continue po diltiazem and metoprolol -PT/OT  -Given the patient's recent falls most notably a fall in May 2016 resulting in left hip fracture and now having maroon stools, she is not a good candidate for anticoagulation  -d/c lovenox for now -will ultimately start on ASA 81mg  daily when GI issues are improved  Hematochezia -02/21/15 am--began having maroon stools  -Consulted [facility] -d/c lovenox -monitor H/H  Sepsis due to UTI Sepsis physiology improved  -urine culture--low yield at this point as pt has received 4 days of ceftriaxone  Acute kidney injury superimposed on CKD stage III -continues to improve with volume expansion  -Baseline creatinine 1.4-1.7   Acute encephalopathy  -Multifactorial including UTI, acute kidney injury, hematochezia, prolonged  hospital stay with daily-night reversion, new onset atrial of fibrillation   Hypokalemia  Improving with replacement - continue to follow  Hypomagnesemia Replace with goal of 2.0  Multi-infarct dementia -per son, pt a bit more confused today  Hip fracture 11/2014 No evidence of current complications - -physical therapy recommended skilled nursing facility   Hypothyroidism on Synthroid TSH is 0.565  5mm LLL lung nodule - noted on CT 2012 follow-up scanning could be pursued for prognostic purposes but patient clearly would not be a candidate for aggressive cancer treatment  RLS  Bipolar  Code Status: NO CODE BLUE Family Communication: Spoke with daughter/multiple other family members at bedside Disposition Plan: likely another 1-2 days due to new GI bleed  Consultants: [facility]  [facility] Procedures: none  Antibiotics: Rocephin 8/5 >  DVT prophylaxis: lovenox     Procedures/Studies: Dg Chest 2 View  02/18/2015   CLINICAL DATA:  Weakness, confusion, urinary frequency, and dysuria. Tachycardia. Chest pain.  EXAM: CHEST - 2 VIEW  COMPARISON:  One-view chest 11/28/2014. Two-view chest x-ray 12/26/2011.  FINDINGS: Atherosclerotic changes are present at the aortic arch. The heart size is upper limits of normal. There is no edema or effusion to suggest failure. Mild emphysematous changes are noted. Exaggerated thoracic kyphosis is evident. The T9 vertebral plana compression fracture has progressed. A superior endplate fracture at T6 is age indeterminate.  IMPRESSION: 1. Stable mild cardiomegaly without failure. 2. Emphysema. 3. Progressive vertebral plana compression fracture at T9. 4. Question superior endplate fracture at T6, new since 2013.   Electronically Signed  By: Marin Roberts M.D.   On: 02/18/2015 12:14          Objective: Filed Vitals:   02/20/15 2220 02/21/15 0513 02/21/15 1300 02/21/15 1421  BP: 119/56 119/62 120/76 117/82  Pulse: 85 93  104   Temp: 98 F (36.7 C) 98.2 F (36.8 C)  98.3 F (36.8 C)  TempSrc: Oral Oral  Oral  Resp: Height:      Weight:      SpO2: 99% 91%  96%    Intake/Output Summary (Last 24 hours) at 02/21/15 1618 Last data filed at 02/21/15 1100  Gross per 24 hour  Intake    906 ml  Output    350 ml  Net    556 ml   Weight change:  Exam:   General:  Pt is alert, follows commands appropriately, not in acute distress  HEENT: No icterus, No thrush, No neck mass, [place]/AT; no meningismus  Cardiovascular: RRR, S1/S2, no rubs, no gallops  Respiratory: CTA bilaterally, no wheezing, no crackles, no rhonchi  Abdomen: Soft/+BS, non tender, non distended, no guarding; no hepatosplenomegaly  Extremities: LLE 1+ edema, No lymphangitis, No petechiae, No rashes, no synovitis; no cyanosis or clubbing   Data Reviewed: Basic Metabolic Panel:  Recent Labs Lab 02/18/15 1211 02/18/15 1852 02/19/15 0750 02/20/15 0321 02/21/15 0645  NA 147* 145 144 142 143  K 2.8* 2.3* 3.0* 3.6 3.7  CL 113* 116* 118* 116* 117*  CO2 19* 22 18* 19* 20*  GLUCOSE 101* 121* 95 77 89  BUN 26* 23* 21* 15 11  CREATININE 2.53* 2.21* 1.98* 1.67* 1.47*  CALCIUM 8.5* 8.0* 7.5* 7.4* 7.5*  MG 1.8  --   --  1.4* 1.9   Liver Function Tests:  Recent Labs Lab 02/18/15 1852 02/20/15 0321  AST 20 23  ALT 12* 13*  ALKPHOS 175* 138*  BILITOT 0.5 0.4  PROT 5.1* 4.8*  ALBUMIN 2.0* 1.8*   No results for input(s): LIPASE, AMYLASE in the last 168 hours. No results for input(s): AMMONIA in the last 168 hours. CBC:  Recent Labs Lab 02/18/15 1211 02/18/15 1852 02/20/15 0321 02/21/15 0645  WBC 11.5* 10.0 8.9 8.2  NEUTROABS 9.6* 8.5*  --   --   HGB 13.0 12.3 10.7* 11.8*  HCT 39.5 37.5 33.9* 37.7  MCV 89.2 89.1 90.4 92.4  PLT 263 220 223 222   Cardiac Enzymes:  Recent Labs Lab 02/18/15 1852 02/18/15 2234 02/19/15 0508  TROPONINI 0.06* 0.05* 0.05*   BNP: Invalid input(s): POCBNP CBG: No results for  input(s): GLUCAP in the last 168 hours.  Recent Results (from the past 240 hour(s))  Culture, blood (x 2)     Status: None (Preliminary result)   Collection Time: 02/18/15 12:36 PM  Result Value Ref Range Status   Specimen Description BLOOD RIGHT ANTECUBITAL  Final   Special Requests BOTTLES DRAWN AEROBIC AND ANAEROBIC 5CC  Final   Culture NO GROWTH 3 DAYS  Final   Report Status PENDING  Incomplete  Culture, blood (x 2)     Status: None (Preliminary result)   Collection Time: 02/18/15 12:41 PM  Result Value Ref Range Status   Specimen Description BLOOD RIGHT HAND  Final   Special Requests BOTTLES DRAWN AEROBIC ONLY 5CC  Final   Culture NO GROWTH 3 DAYS  Final   Report Status PENDING  Incomplete  MRSA PCR Screening     Status: None   Collection Time: 02/18/15  5:21 PM  Result Value Ref Range Status   MRSA by PCR NEGATIVE NEGATIVE Final    Comment:        The GeneXpert MRSA Assay (FDA approved for NASAL specimens only), is one component of a comprehensive MRSA colonization surveillance program. It is not intended to diagnose MRSA infection nor to guide or monitor treatment for MRSA infections.   C difficile quick scan w PCR reflex     Status: None   Collection Time: 02/19/15  2:01 AM  Result Value Ref Range Status   C Diff antigen NEGATIVE NEGATIVE Final   C Diff toxin NEGATIVE NEGATIVE Final   C Diff interpretation Negative for toxigenic C. difficile  Final     Scheduled Meds: . antiseptic oral rinse  7 mL Mouth Rinse q12n4p  . aspirin EC  81 mg Oral Daily  . cefTRIAXone (ROCEPHIN)  IV  1 g Intravenous Q24H  . chlorhexidine gluconate  15 mL Mouth Rinse BID  . diltiazem  60 mg Oral 4 times per day  . donepezil  5 mg Oral QHS  . enoxaparin (LOVENOX) injection  1 mg/kg Subcutaneous Q24H  . FLUoxetine  40 mg Oral Daily  . levothyroxine  50 mcg Oral QAC breakfast  . memantine  10 mg Oral BID  . metoprolol tartrate  25 mg Oral Q12H  . potassium chloride  40 mEq Oral Daily    Continuous Infusions: . sodium chloride 50 mL/hr at 02/20/15 1732     Akia Desroches, DO  [facility] Pager [phone]  If 7PM-7AM, please contact night-coverage [email or web address] Password [email or web address] 02/21/2015, 4:18 PM   LOS: 3 days

## 2015-02-21 NOTE — Clinical Social Work Note (Signed)
Clinical Social Work Assessment  Patient Details  Name: Jennifer [name] MRN: [id number] Date of Birth: 1929-04-18  Date of referral:  02/21/15               Reason for consult:  Facility Placement, Discharge Planning                Permission sought to share information with:  Family Supports, Oceanographer granted to share information::  Yes, Verbal Permission Granted  Name::     Metallurgist::  SNFs  Relationship::     Contact Information:     Housing/Transportation Living arrangements for the past 2 months:  Single Family Home Source of Information:  Adult Children, Patient Patient Interpreter Needed:  None Criminal Activity/Legal Involvement Pertinent to Current Situation/Hospitalization:  No - Comment as needed Significant Relationships:  Adult Children Lives with:  Self Do you feel safe going back to the place where you live?  Yes Need for family participation in patient care:  Yes (Comment)  Care giving concerns:  Patient's family shares that they cannot manage the patient at home at this time.   Social Worker assessment / plan:  CSW spoke with patient and patient's daughter Jennifer Asp to complete assessment. Patient was admitted from home where she lives alone. Patient's last admission was 5/15 after which she was sent to [facility]. The patient was discharged home a "little over a month ago", she spent two weeks at her daughter [name]'s then went home where she has been for the past two weeks. Both the patient and daughter agree that SNF for short term rehab will be needed at discharge. CSW explained SNF search/placement process and answered questions. CSW made sure both understand associated copays for SNF. CSW will follow up with bed offers. [facility] is their preferred facility.  Employment status:  Disabled (Comment on whether or not currently receiving Disability) Insurance information:  Managed Medicare PT Recommendations:  Skilled Nursing  Facility Information / Referral to community resources:  Skilled Nursing Facility  Patient/Family's Response to care:  Patient and family appear to be happy with the care the patient has received.   Patient/Family's Understanding of and Emotional Response to Diagnosis, Current Treatment, and Prognosis:  Patient and family appear to have good insight into reason for admission and post DC needs.   Emotional Assessment Appearance:  Appears stated age Attitude/Demeanor/Rapport:  Other (Appropriate) Affect (typically observed):  Accepting, Appropriate, Calm Orientation:  Oriented to  Time, Oriented to Situation, Oriented to Place, Oriented to Self Alcohol / Substance use:  Tobacco Use (Hx of) Psych involvement (Current and /or in the community):  No (Comment)  Discharge Needs  Concerns to be addressed:  Discharge Planning Concerns Readmission within the last 30 days:  No Current discharge risk:  Physical Impairment, Chronically ill Barriers to Discharge:  Continued Medical Work up  [employer] MSW, [place], [place], [phone]

## 2015-02-21 NOTE — Progress Notes (Signed)
Patient Name: Jennifer [name] Date of Encounter: 02/21/2015  Active Problems:   Vascular dementia with delusions   RESTLESS LEG SYNDROME   Sepsis   Atrial fibrillation, new onset - CHADs2VASC score 4   Primary Cardiologist: New Patient Profile: 81 y/o female with history of HTN, HLD, RLS, former tobacco abuse, multi-infarct dementia, hypothyroidism CKD stage IV, 5mm pulm nodule in 2012 (f/u CT recommended 6-12 months) admitted on 02/18/15 for recent falls. Found to be in a.fib upon arrival to ED.   SUBJECTIVE: Denies any chest pain or palpitations. Reports pain along her left hip. Notes shortness of breath after talking for long periods of time or walking around the room.   OBJECTIVE Filed Vitals:   02/20/15 2220 02/21/15 0513 02/21/15 1300 02/21/15 1421  BP: 119/56 119/62 120/76 117/82  Pulse: 85 93  104  Temp: 98 F (36.7 C) 98.2 F (36.8 C)  98.3 F (36.8 C)  TempSrc: Oral Oral  Oral  Resp: 20 18  20   Height:      Weight:      SpO2: 99% 91%  96%    Intake/Output Summary (Last 24 hours) at 02/21/15 1524 Last data filed at 02/21/15 1100  Gross per 24 hour  Intake    906 ml  Output    350 ml  Net    556 ml   Filed Weights   02/18/15 1239 02/18/15 2100  Weight: 156 lb (70.761 kg) 153 lb 3.5 oz (69.5 kg)    PHYSICAL EXAM General: Well developed, well nourished, female in no acute distress. A&Ox2 (name, location).  Head: Normocephalic, atraumatic.  Neck: Supple without bruits, JVD not elevated. Lungs:  Resp regular and unlabored, On auscultation, mild rales at bases bilaterally. Heart: RRR, S1, S2, no S3, S4, or murmur; no rub. Abdomen: Soft, non-tender, non-distended, BS + x 4.  Extremities: No clubbing, no cyanosis, 1+ edema along left lower extremity. Distal pulses 2+  Neuro: Alert and oriented X 3. Moves all extremities spontaneously. Psych: Normal affect.  LABS: CBC:  Recent Labs  02/18/15 1852 02/20/15 0321 02/21/15 0645  WBC 10.0 8.9 8.2  NEUTROABS  8.5*  --   --   HGB 12.3 10.7* 11.8*  HCT 37.5 33.9* 37.7  MCV 89.1 90.4 92.4  PLT 220 223 222   INR:  Recent Labs  02/19/15 0508  INR 1.45   Basic Metabolic Panel:  Recent Labs  16/10/96 0321 02/21/15 0645  NA 142 143  K 3.6 3.7  CL 116* 117*  CO2 19* 20*  GLUCOSE 77 89  BUN 15 11  CREATININE 1.67* 1.47*  CALCIUM 7.4* 7.5*  MG 1.4* 1.9   Liver Function Tests:  Recent Labs  02/18/15 1852 02/20/15 0321  AST 20 23  ALT 12* 13*  ALKPHOS 175* 138*  BILITOT 0.5 0.4  PROT 5.1* 4.8*  ALBUMIN 2.0* 1.8*   Cardiac Enzymes:  Recent Labs  02/18/15 1852 02/18/15 2234 02/19/15 0508  TROPONINI 0.06* 0.05* 0.05*   Thyroid Function Tests:  Recent Labs  02/18/15 1852  TSH 0.565    TELE: Sinus tachycardia with PAC's; HR ranging from 100-140's.       Current Medications:  . antiseptic oral rinse  7 mL Mouth Rinse q12n4p  . aspirin EC  81 mg Oral Daily  . cefTRIAXone (ROCEPHIN)  IV  1 g Intravenous Q24H  . chlorhexidine gluconate  15 mL Mouth Rinse BID  . diltiazem  60 mg Oral 4 times per day  .  donepezil  5 mg Oral QHS  . enoxaparin (LOVENOX) injection  1 mg/kg Subcutaneous Q24H  . FLUoxetine  40 mg Oral Daily  . levothyroxine  50 mcg Oral QAC breakfast  . memantine  10 mg Oral BID  . metoprolol tartrate  25 mg Oral Q12H  . potassium chloride  40 mEq Oral Daily   . sodium chloride 50 mL/hr at 02/20/15 1732    ASSESSMENT AND PLAN:  1. New onset atrial fibrillation - - Was in A.Fib upon arrival to [facility] ED on 02/18/15. Went into NSR on 02/20/15.  -  CHADSVASC = for now is confirmed at 6, most recent EF 50% in 2009. Echo when rate is better controlled. -  Internal medicine has ordered Lovenox per pharmacy for atrial fib while inpatient. Not an ideal candidate for long-term oral anticoagulation given history of repeated falls and dementia.  - Continue Cardizem  four times daily. - Consider Metoprolol for rate control. BP has been 105/56 - 123/93 for past 24  hours. Per Dr. Tresa Endo, will start Metoprolol  q12. Will hold if pulse <55.  Otherwise, per IM.  This patients CHA2DS2-VASc Score and unadjusted Ischemic Stroke Rate (% per year) is equal to 9.7 % stroke rate/year from a score of 6 Above score calculated as 1 point each if present [CHF, HTN, DM, Vascular=MI/PAD/Aortic Plaque, Age if 65-74, or Female], 2 points each if present [Age > 75, or Stroke/TIA/TE]  Signed, Theodore Demark , PA-C 3:24 PM 02/21/2015    Patient seen and examined. Agree with assessment and plan. HR increased paroxysmally to rates up to 120, sinus with PAC's and ? Brief bursts of PAF.  Will start metoprolol.   Lennette Bihari, MD, [facility] 02/21/2015 3:30 PM

## 2015-02-21 NOTE — Evaluation (Signed)
Occupational Therapy Evaluation Patient Details Name: Jennifer [name] MRN: [id number] DOB: 08-29-1928 Today's Date: 02/21/2015    History of Present Illness 81 y/o female with h/o RLS, former chronic smoker about 35 pack years quit 1990s, multi-infarct dementia secondary to multiple to CVA-2010?, hypothyroid, HTN, A/CKD stg IV, recent comminuted intertrochanteric left hip fracture S/P repair 11/29/14, previous L4-5 herniated disc status post repair 12/22/11, 5 mm LLL nodule with risk of bronchogenic cancer   Clinical Impression   Pt admitted with above. Pt receiving assist with bathing, PTA. Feel pt will benefit from acute OT to increase activity tolerance and independence prior to d/c.  Follow Up Recommendations  SNF    Equipment Recommendations  Other (comment) (defer to next venue)    Recommendations for Other Services       Precautions / Restrictions Precautions Precautions: Fall Restrictions Weight Bearing Restrictions: No      Mobility Bed Mobility Overal bed mobility: Needs Assistance Bed Mobility: Supine to Sit;Sit to Supine;Rolling Rolling: Modified independent (Device/Increase time) (cue to roll over further)   Supine to sit: Mod assist Sit to supine: Supervision   General bed mobility comments: assist to scoot HOB and trendelenburg position used. Assist with trunk to come to sitting position.  Transfers Overall transfer level: Needs assistance   Transfers: Sit to/from Stand Sit to Stand: Min assist         General transfer comment: assist to power up.    Balance       No LOB sitting EOB and managing socks. Pt using support of walker while standing-able to let go with one hand to reach behind-Min guard.                                       ADL Overall ADL's : Needs assistance/impaired     Grooming: Wash/dry face;Set up;Supervision/safety;Sitting (applied chapstick)               Lower Body Dressing: Min guard;Minimal  assistance;Sit to/from stand   Toilet Transfer: Minimal assistance;RW (sit to stand from bed)   Toileting- Clothing Manipulation and Hygiene: Sit to/from stand;Minimal assistance;Moderate assistance         General ADL Comments: Educated on deep breathing technique. Pt with decreased activity tolerance in session and became SOB (HR up to 116). Pt able to don/doff socks sitting EOB. Pt unable to reach fully behind to her bottom to wipe it off-OT assisted and also applied cream to bottom.     Vision     Perception     Praxis      Pertinent Vitals/Pain Pain Assessment: 0-10 Pain Score: 2  Pain Location: bottom, abdomen at times, LLE, lips Pain Descriptors / Indicators: Sore Pain Intervention(s): Other (comment);Repositioned;Monitored during session (cream applied to bottom and told nurse)   HR up to 116 in session. Cues for deep breathing.     Hand Dominance Right   Extremity/Trunk Assessment Upper Extremity Assessment Upper Extremity Assessment: Generalized weakness   Lower Extremity Assessment Lower Extremity Assessment: Defer to PT evaluation       Communication Communication Communication: HOH (difficult to understand at times)   Cognition Arousal/Alertness: Awake/alert Behavior During Therapy: Anxious;WFL for tasks assessed/performed Overall Cognitive Status: History of cognitive impairments - at baseline                     General Comments  Exercises       Shoulder Instructions      Home Living Family/patient expects to be discharged to:: Private residence Living Arrangements: Alone Available Help at Discharge: Family;Available PRN/intermittently Type of Home: House Home Access: Ramped entrance     Home Layout: One level               Home Equipment: Walker - 4 wheels;Toilet riser;Bedside commode;Shower seat;Walker - 2 wheels   Additional Comments: After convalescence from her hip fracture she went to [facility] facility for  about a month and then went to stay with her daughter [name]. She was recently taken back home by her family and has been staying alone at home. She has been doing some of her ADLs and IADLs independently and walks with a walker and a cane but fell yesterday      Prior Functioning/Environment Level of Independence: Needs assistance    ADL's / Homemaking Assistance Needed: assist with bathing, but pt could dress herself        OT Diagnosis: Generalized weakness;Acute pain   OT Problem List: Decreased cognition;Pain;Cardiopulmonary status limiting activity;Decreased knowledge of precautions;Decreased knowledge of use of DME or AE;Decreased strength;Decreased range of motion;Decreased activity tolerance   OT Treatment/Interventions: Self-care/ADL training;Therapeutic exercise;Energy conservation;DME and/or AE instruction;Therapeutic activities;Cognitive remediation/compensation;Patient/family education;Balance training    OT Goals(Current goals can be found in the care plan section) Acute Rehab OT Goals Patient Stated Goal: not stated OT Goal Formulation: With patient/family Time For Goal Achievement: 02/28/15 Potential to Achieve Goals: Good ADL Goals Pt Will Perform Lower Body Bathing: sit to/from stand;with set-up;with supervision Pt Will Perform Lower Body Dressing: sit to/from stand;with set-up;with supervision Pt Will Transfer to Toilet: with supervision;ambulating Pt Will Perform Toileting - Clothing Manipulation and hygiene: sit to/from stand;with min guard assist Additional ADL Goal #1: Pt will incorporate energy conservation techniques during ADLs/functional mobility with minimal verbal cues.  OT Frequency: Min 2X/week   Barriers to D/C:            Co-evaluation              End of Session Equipment Utilized During Treatment: Gait belt;Rolling walker Nurse Communication: Mobility status;Other (comment) (applied cream to pt's bottom); pt in bed  Activity Tolerance:  Patient limited by fatigue Patient left: in bed;with call bell/phone within reach;with family/visitor present;with bed alarm set   Time: 1610-9604 OT Time Calculation (min): 16 min Charges:  OT General Charges $OT Visit: 1 Procedure OT Evaluation $Initial OT Evaluation Tier I: 1 Procedure G-CodesEarlie Raveling OTR/L [id number] 02/21/2015, 11:47 AM

## 2015-02-21 NOTE — Care Management Important Message (Signed)
Important Message  Patient Details  Name: Jennifer [name] MRN: [id number] Date of Birth: 02-04-1929   Medicare Important Message Given:  Yes-second notification given    Yvonna Alanis 02/21/2015, 3:13 PM

## 2015-02-21 NOTE — Consult Note (Signed)
Subjective:   HPI  The patient is an 81 year old female with multiple medical problems including multi-infarct dementia, hypothyroidism, hypertension, chronic kidney disease, and a recent hip fracture. She was admitted to the hospital with rapid atrial fib.  We were asked to see this patient in regards to hematochezia. According to the nurse the patient passed a couple of maroonish stools this morning, but none since. Her hemoglobin this morning was 11.8. 3 days ago it was 12.3. Patient denies abdominal pain. She had a colonoscopy in 2007 which showed internal hemorrhoids and diverticulosis. She had had a couple of colonoscopies prior to that one. She has never had an adenomatous colon polyp.  Review of Systems Denies chest pain or shortness of breath at this time.  Past Medical History  Diagnosis Date  . ALLERGIC RHINITIS 02/04/2007  . ANEMIA-IRON DEFICIENCY 08/11/2008  . ANXIETY 08/11/2008  . FREQUENCY, URINARY 04/28/2008  . GERD 02/04/2007  . Hypopotassemia 11/08/2008  . LOW BACK PAIN 12/31/2007  . OSTEOARTHRITIS 12/31/2007  . OSTEOPOROSIS 02/04/2007  . PRURITUS 06/03/2009  . Restless leg syndrome   . Vascular dementia with delusions 11/25/2008    a. H/o multi-infarct dementia secondary to multiple to CVA-2010?[name]  . WEIGHT LOSS 11/05/2008  . TIA (transient ischemic attack)     a. Per son, had TIAs in 2010.  [name] Former tobacco use     a. 35 pack year, quit 1990s.  . Hypothyroidism   . Hypertension   . CKD (chronic kidney disease), stage IV   . Lung nodule     a. 2012 - 5 mm left lower lobe nodule.  [name] Hyperlipidemia   . Frequent falls   . Closed comminuted intertrochanteric fracture of proximal femur     a. 11/2014 requiring surgery - mechanical fall.  . Pericardial effusion     a. Mod-large pericardial effusion in 2012 by CT report.  . Hyperplastic colon polyp 2002    Dr Ewing Schlein   Past Surgical History  Procedure Laterality Date  . Hemorrhoid surgery    . Abdominal hysterectomy    .  Tonsillectomy    . Knee surgery      right  . Laminectomy    . Back surgery    . Tonsillectomy    . Knee arthroscopy      Right  . Appendectomy    . Lumbar laminectomy/decompression microdiscectomy  12/31/2011    Procedure: LUMBAR LAMINECTOMY/DECOMPRESSION MICRODISCECTOMY 1 LEVEL;  Surgeon: Cristi Loron, MD;  Location: [facility];  Service: Neurosurgery;  Laterality: Left;  Left Lumbar Four-Five Discectomy redo   . Femur im nail Left 11/28/2104  . Intramedullary (im) nail intertrochanteric Left 11/29/2014    Procedure: INTRAMEDULLARY (IM) NAIL INTERTROCHANTRIC;  Surgeon: Tarry Kos, MD;  Location: [facility] OR;  Service: Orthopedics;  Laterality: Left;  . Colonoscopy  2002,  2007   History   Social History  . Marital Status: Widowed    Spouse Name: N/A  . Number of Children: N/A  . Years of Education: N/A   Occupational History  . Not on file.   Social History Main Topics  . Smoking status: Former Smoker    Quit date: 07/16/1988  . Smokeless tobacco: Never Used  . Alcohol Use: No  . Drug Use: No  . Sexual Activity: Not on file   Other Topics Concern  . 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  Current facility-administered medications:  .  acetaminophen (TYLENOL) tablet 650 mg, 650 mg, Oral, Q4H PRN, Rhetta Mura, MD, 650 mg at 02/20/15 0921 .  antiseptic oral rinse (CPC / CETYLPYRIDINIUM CHLORIDE 0.05%) solution 7 mL, 7 mL, Mouth Rinse, q12n4p, Lonia Blood, MD, 7 mL at 02/21/15 1347 .  cefTRIAXone (ROCEPHIN) 1 g in dextrose 5 % 50 mL IVPB, 1 g, Intravenous, Q24H, Para March Batchelder, RPH, 1 g at 02/21/15 1351 .  chlorhexidine gluconate (PERIDEX)  0.12 % solution 15 mL, 15 mL, Mouth Rinse, BID, Lonia Blood, MD, 15 mL at 02/21/15 1000 .  diltiazem (CARDIZEM) tablet 60 mg, 60 mg, Oral, 4 times per day, Rhetta Mura, MD, 60 mg at 02/21/15 1347 .  donepezil (ARICEPT) tablet 5 mg, 5 mg, Oral, QHS, Rhetta Mura, MD, 5 mg at 02/20/15 2006 .  FLUoxetine (PROZAC) capsule 40 mg, 40 mg, Oral, Daily, Rhetta Mura, MD, 40 mg at 02/21/15 1019 .  levothyroxine (SYNTHROID, LEVOTHROID) tablet 50 mcg, 50 mcg, Oral, QAC breakfast, Rhetta Mura, MD, 50 mcg at 02/21/15 0700 .  memantine (NAMENDA) tablet 10 mg, 10 mg, Oral, BID, Rhetta Mura, MD, 10 mg at 02/21/15 1019 .  metoprolol tartrate (LOPRESSOR) tablet 25 mg, 25 mg, Oral, Q12H, Rhonda G Barrett, PA-C .  ondansetron (ZOFRAN) injection 4 mg, 4 mg, Intravenous, Q6H PRN, Rhetta Mura, MD, 4 mg at 02/19/15 1340 .  potassium chloride SA (K-DUR,KLOR-CON) CR tablet 40 mEq, 40 mEq, Oral, Daily, Lonia Blood, MD, 40 mEq at 02/21/15 1019 .  promethazine (PHENERGAN) injection 12.5 mg, 12.5 mg, Intravenous, Q6H PRN, Lonia Blood, MD, 12.5 mg at 02/20/15 0951 Allergies  Allergen Reactions  . Ciprofloxacin     REACTION: itching  . Diazepam     REACTION: unspecified  . Meperidine Hcl     REACTION: unspecified     Objective:     BP 117/82 mmHg  Pulse 104  Temp(Src) 98.3 F (36.8 C) (Oral)  Resp 20  Ht 5\' 6"  (1.676 m)  Wt 69.5 kg (153 lb 3.5 oz)  BMI 24.74 kg/m2  SpO2 96%  She does not appear in any acute distress  Nonicteric  Heart no murmurs heard  Lungs clear  Abdomen: Bowel sounds present, soft, nontender  Laboratory No components found for: D1    Assessment:     Hematochezia. Most likely of diverticular origin or hemorrhoidal in nature.      Plan:     Follow clinically. Follow H&H. Hopefully this will resolve spontaneously. I do not anticipate she will need repeat colonoscopy. If bleeding continues would consider doing a  nuclear medicine GI bleeding scan and if positive proceed with angiography with embolization Lab Results  Component Value Date   HGB 11.8* 02/21/2015   HGB 10.7* 02/20/2015   HGB 12.3 02/18/2015   HCT 37.7 02/21/2015   HCT 33.9* 02/20/2015   HCT 37.5 02/18/2015   ALKPHOS 138* 02/20/2015   ALKPHOS 175* 02/18/2015   ALKPHOS 97 11/29/2014   AST 23 02/20/2015   AST 20 02/18/2015   AST 18 11/29/2014   ALT 13* 02/20/2015   ALT 12* 02/18/2015   ALT 12* 11/29/2014

## 2015-02-21 NOTE — Clinical Social Work Placement (Signed)
   CLINICAL SOCIAL WORK PLACEMENT  NOTE  Date:  02/21/2015  Patient Details  Name: Jennifer [name] MRN: [id number] Date of Birth: 11-11-1928  Clinical Social Work is seeking post-discharge placement for this patient at the Skilled  Nursing Facility level of care (*CSW will initial, date and re-position this form in  chart as items are completed):  Yes   Patient/family provided with [facility] Clinical Social Work Department's list of facilities offering this level of care within the geographic area requested by the patient (or if unable, by the patient's family).  Yes   Patient/family informed of their freedom to choose among providers that offer the needed level of care, that participate in Medicare, Medicaid or managed care program needed by the patient, have an available bed and are willing to accept the patient.  Yes   Patient/family informed of [facility]'s ownership interest in [facility] and [facility], as well as of the fact that they are under no obligation to receive care at these facilities.  PASRR submitted to EDS on       PASRR number received on       Existing PASRR number confirmed on 02/21/15     FL2 transmitted to all facilities in geographic area requested by pt/family on 02/21/15     FL2 transmitted to all facilities within larger geographic area on       Patient informed that his/her managed care company has contracts with or will negotiate with certain facilities, including the following:            Patient/family informed of bed offers received.  Patient chooses bed at       Physician recommends and patient chooses bed at      Patient to be transferred to   on  .  Patient to be transferred to facility by       Patient family notified on   of transfer.  Name of family member notified:        PHYSICIAN       Additional Comment:    _______________________________________________ Roddie Mc MSW, [place], [place], [phone]

## 2015-02-21 NOTE — Progress Notes (Signed)
ANTICOAGULATION CONSULT NOTE - Follow Up Consult  Pharmacy Consult for Enoxaparin Indication: atrial fibrillation  Allergies  Allergen Reactions  . Ciprofloxacin     REACTION: itching  . Diazepam     REACTION: unspecified  . Meperidine Hcl     REACTION: unspecified    Patient Measurements: Height:  (167.6 cm) Weight: 153 lb 3.5 oz (69.5 kg) IBW/kg (Calculated) : 59.3   Vital Signs: Temp: 98.2 F (36.8 C) (08/08 0513) Temp Source: Oral (08/08 0513) BP: 119/62 mmHg (08/08 0513) Pulse Rate: 93 (08/08 0513)  Labs:  Recent Labs  02/18/15 1852 02/18/15 2234 02/19/15 0508 02/19/15 0750 02/20/15 0321 02/21/15 0645  HGB 12.3  --   --   --  10.7* 11.8*  HCT 37.5  --   --   --  33.9* 37.7  PLT 220  --   --   --  223 222  APTT 24  --   --   --   --   --   LABPROT 15.4*  --  17.8*  --   --   --   INR 1.20  --  1.45  --   --   --   CREATININE 2.21*  --   --  1.98* 1.67* 1.47*  TROPONINI 0.06* 0.05* 0.05*  --   --   --     Estimated Creatinine Clearance: 27.1 mL/min (by C-G formula based on Cr of 1.47).   Medications:  Scheduled:  . antiseptic oral rinse  7 mL Mouth Rinse q12n4p  . aspirin EC  81 mg Oral Daily  . cefTRIAXone (ROCEPHIN)  IV  1 g Intravenous Q24H  . chlorhexidine gluconate  15 mL Mouth Rinse BID  . diltiazem  60 mg Oral 4 times per day  . donepezil  5 mg Oral QHS  . enoxaparin (LOVENOX) injection  1 mg/kg Subcutaneous Q24H  . FLUoxetine  40 mg Oral Daily  . levothyroxine  50 mcg Oral QAC breakfast  . memantine  10 mg Oral BID  . potassium chloride  40 mEq Oral Daily   Infusions:  . sodium chloride 50 mL/hr at 02/20/15 1732    Assessment: 81 yo female presenting with AKI on top of CKD stage IV.  Pharmacy consulted to dose enoxaparin for a fib.  CBC stable, no s/s of bleed.   Estimated Creatinine Clearance: 27.1 mL/min (by C-G formula based on Cr of 1.47).   Goal of Therapy:   Monitor platelets by anticoagulation protocol: Yes   Plan:   - Continue Lovenox 70 mg sub-q every 24 hours. - Monitor CBC, s/sx of bleeding - F/U plans to convert to oral anticoagulation   Elige Ko, Pharm.D., BCPS Clinical Pharmacist [phone] Pager 02/21/2015 8:55 AM

## 2015-02-21 NOTE — Care Management Note (Signed)
Case Management Note  Patient Details  Name: Jennifer [name] MRN: [id number] Date of Birth: 1928-11-07  Subjective/Objective:      Patient lives alone, she has a rolling walker, rollator, and a w/chair at home.  Per pt eval rec SNF.  Patient's son Palma Buster is her POA [phone].  Daughter is Cindy [phone].  NCM will cont to follow for dc needs.               Action/Plan:   Expected Discharge Date:                  Expected Discharge Plan:  Skilled Nursing Facility  In-House Referral:  Clinical Social Work  Discharge planning Services  CM Consult  Post Acute Care Choice:    Choice offered to:     DME Arranged:    DME Agency:     HH Arranged:    HH Agency:     Status of Service:  In process, will continue to follow  Medicare Important Message Given:    Date Medicare IM Given:    Medicare IM give by:    Date Additional Medicare IM Given:    Additional Medicare Important Message give by:     If discussed at Long Length of Stay Meetings, dates discussed:    Additional Comments:  Leone Haven, RN 02/21/2015, 11:45 AM

## 2015-02-22 DIAGNOSIS — R471 Dysarthria and anarthria: Secondary | ICD-10-CM | POA: Diagnosis not present

## 2015-02-22 DIAGNOSIS — R278 Other lack of coordination: Secondary | ICD-10-CM | POA: Diagnosis not present

## 2015-02-22 DIAGNOSIS — R2689 Other abnormalities of gait and mobility: Secondary | ICD-10-CM | POA: Diagnosis not present

## 2015-02-22 DIAGNOSIS — K921 Melena: Secondary | ICD-10-CM | POA: Diagnosis not present

## 2015-02-22 DIAGNOSIS — D682 Hereditary deficiency of other clotting factors: Secondary | ICD-10-CM | POA: Diagnosis not present

## 2015-02-22 DIAGNOSIS — F0150 Vascular dementia without behavioral disturbance: Secondary | ICD-10-CM | POA: Diagnosis not present

## 2015-02-22 DIAGNOSIS — A419 Sepsis, unspecified organism: Secondary | ICD-10-CM | POA: Diagnosis not present

## 2015-02-22 DIAGNOSIS — K625 Hemorrhage of anus and rectum: Secondary | ICD-10-CM | POA: Diagnosis not present

## 2015-02-22 DIAGNOSIS — M6281 Muscle weakness (generalized): Secondary | ICD-10-CM | POA: Diagnosis not present

## 2015-02-22 DIAGNOSIS — N183 Chronic kidney disease, stage 3 (moderate): Secondary | ICD-10-CM | POA: Diagnosis not present

## 2015-02-22 DIAGNOSIS — N390 Urinary tract infection, site not specified: Secondary | ICD-10-CM | POA: Diagnosis not present

## 2015-02-22 DIAGNOSIS — I4891 Unspecified atrial fibrillation: Secondary | ICD-10-CM | POA: Diagnosis not present

## 2015-02-22 DIAGNOSIS — R918 Other nonspecific abnormal finding of lung field: Secondary | ICD-10-CM | POA: Diagnosis not present

## 2015-02-22 DIAGNOSIS — B377 Candidal sepsis: Secondary | ICD-10-CM

## 2015-02-22 LAB — CBC
HCT: 37.5 % (ref 36.0–46.0)
HEMOGLOBIN: 11.7 g/dL — AB (ref 12.0–15.0)
MCH: 28.8 pg (ref 26.0–34.0)
MCHC: 31.2 g/dL (ref 30.0–36.0)
MCV: 92.4 fL (ref 78.0–100.0)
PLATELETS: 230 10*3/uL (ref 150–400)
RBC: 4.06 MIL/uL (ref 3.87–5.11)
RDW: 16.7 % — AB (ref 11.5–15.5)
WBC: 7.7 10*3/uL (ref 4.0–10.5)

## 2015-02-22 LAB — BASIC METABOLIC PANEL
Anion gap: 6 (ref 5–15)
BUN: 9 mg/dL (ref 6–20)
CO2: 20 mmol/L — AB (ref 22–32)
CREATININE: 1.35 mg/dL — AB (ref 0.44–1.00)
Calcium: 7.4 mg/dL — ABNORMAL LOW (ref 8.9–10.3)
Chloride: 115 mmol/L — ABNORMAL HIGH (ref 101–111)
GFR calc non Af Amer: 35 mL/min — ABNORMAL LOW (ref >60)
GFR, EST AFRICAN AMERICAN: 41 mL/min — AB (ref >60)
Glucose, Bld: 84 mg/dL (ref 65–99)
POTASSIUM: 4.2 mmol/L (ref 3.5–5.1)
Sodium: 141 mmol/L (ref 135–145)

## 2015-02-22 MED ORDER — DILTIAZEM HCL ER COATED BEADS 240 MG PO CP24: 240.0000 mg | ORAL_CAPSULE | Freq: Every day | ORAL | Status: AC

## 2015-02-22 MED ORDER — HYDROCODONE-ACETAMINOPHEN 5-325 MG PO TABS: 1.0000 | ORAL_TABLET | Freq: Four times a day (QID) | ORAL | Status: AC | PRN

## 2015-02-22 MED ORDER — METOPROLOL TARTRATE 25 MG PO TABS: 25.0000 mg | ORAL_TABLET | Freq: Two times a day (BID) | ORAL | Status: DC

## 2015-02-22 MED ORDER — CEFDINIR 300 MG PO CAPS: 300.0000 mg | ORAL_CAPSULE | Freq: Two times a day (BID) | ORAL | Status: AC

## 2015-02-22 MED ORDER — DILTIAZEM HCL ER COATED BEADS 240 MG PO CP24
240.0000 mg | ORAL_CAPSULE | Freq: Every day | ORAL | Status: DC
  Administered 2015-02-22: 240 mg via ORAL
  Filled 2015-02-22: qty 1

## 2015-02-22 NOTE — Clinical Social Work Placement (Signed)
   CLINICAL SOCIAL WORK PLACEMENT  NOTE  Date:  02/22/2015  Patient Details  Name: Jennifer [name] MRN: [id number] Date of Birth: [birth year]  Clinical Social Work is seeking post-discharge placement for this patient at the Skilled  Nursing Facility level of care (*CSW will initial, date and re-position this form in  chart as items are completed):  Yes   Patient/family provided with [facility] Clinical Social Work Department's list of facilities offering this level of care within the geographic area requested by the patient (or if unable, by the patient's family).  Yes   Patient/family informed of their freedom to choose among providers that offer the needed level of care, that participate in Medicare, Medicaid or managed care program needed by the patient, have an available bed and are willing to accept the patient.  Yes   Patient/family informed of [facility]'s ownership interest in [facility] and [facility], as well as of the fact that they are under no obligation to receive care at these facilities.  PASRR submitted to EDS on       PASRR number received on       Existing PASRR number confirmed on 02/21/15     FL2 transmitted to all facilities in geographic area requested by pt/family on 02/21/15     FL2 transmitted to all facilities within larger geographic area on       Patient informed that his/her managed care company has contracts with or will negotiate with certain facilities, including the following:        Yes   Patient/family informed of bed offers received.  Patient chooses bed at [facility]     Physician recommends and patient chooses bed at      Patient to be transferred to [facility] on 02/22/15.  Patient to be transferred to facility by Ambulance     Patient family notified on 02/22/15 of transfer.  Name of family member notified:  [name] (patient daughter over the phone)     PHYSICIAN       Additional Comment:

## 2015-02-22 NOTE — Discharge Summary (Signed)
Physician Discharge Summary  Jennifer [name] ZOX:[id number] DOB: 1928/12/21 DOA: 02/18/2015  PCP: Rogelia Boga, MD  Admit date: 02/18/2015 Discharge date: 02/22/2015  Recommendations for Outpatient Follow-up:  1. Pt will need to follow up with PCP in 2 weeks post discharge 2. Please obtain BMP and CBC in one week Discharge Diagnoses:  New-onset A fibrillation w/ RVR CHA2DS2 - VASc 6 - rate now well controlled - - transitioned off IV Cardizem-->continue po diltiazem and metoprolol -PT/OT-->SNF -Given the patient's recent falls most notably a fall in May 2016 resulting in left hip fracture and now having maroon stools, she is not a good candidate for anticoagulation  -d/c lovenox for now -The patient will start aspirin 81 mg daily for stroke prophylaxis -Discontinue losartan and amlodipine  Hematochezia -02/21/15 am--began having maroon stools  -Consulted [facility]--suspected diverticular bleed versus hemorrhoidal. Did not feel the patient needed any further endoscopic evaluation at this time as the patient is hemodynamically stable and hemoglobin was stable. -In addition, the patient's hematochezia resolved during the hospitalization. -d/c lovenox -monitor H/H--hemoglobin remained stable. 11.7 on the day of discharge.  Sepsis due to UTI Sepsis physiology improved  -urine culture--low yield at this point as pt has received 4 days of ceftriaxone -Patient will be discharged with 3 additional days of Omnicef to finish 7 days of therapy -The patient had loose stools. C. difficile was obtained and was negative.  Acute kidney injury superimposed on CKD stage III -continues to improve with volume expansion  -Baseline creatinine 1.4-1.7  -Serum creatinine 1.35 on the day of discharge-  Acute encephalopathy  -Multifactorial including UTI, acute kidney injury, hematochezia, prolonged hospital stay with daily-night reversion, new onset atrial of fibrillation   Hypokalemia   Improving with replacement - continue to follow -Potassium 4.2 on the day of discharge. Please recheck BMP in 1 week  Hypomagnesemia Replace with goal of 2.0  Multi-infarct dementia -The patient's intermittent confusion multifactorial as discussed above -On the day of discharge, the patient's confusion seems to be improving although the patient is not yet back at baseline -I feel that once the patient is discharged and is back in a stable environment, her mental status will continue to get better as her metabolic derangements and reversible medical issues are continuing to improve and have been addressed.  Hip fracture 11/2014 No evidence of current complications - -physical therapy recommended skilled nursing facility   Hypothyroidism on Synthroid TSH is 0.565  5mm LLL lung nodule - noted on CT 2012 follow-up scanning could be pursued for prognostic purposes but patient clearly would not be a candidate for aggressive cancer treatment  RLS  Bipolar  Code Status: NO CODE BLUE Family Communication: Spoke with daughter/multiple other family members at bedside Disposition Plan: likely another 1-2 days due to new GI bleed  Consultants: [facility]  [facility] Procedures: none  Antibiotics: Rocephin 8/5 >  DVT prophylaxis: lovenox   Discharge Condition: stable  Disposition: SNF  Diet:regular Wt Readings from Last 3 Encounters:  02/18/15 69.5 kg (153 lb 3.5 oz)  11/28/14 70.761 kg (156 lb)  01/28/14 76.658 kg (169 lb)    History of present illness:  81 y/o ? w/ a h/o RLS, former smoker, multi-infarct dementia, hypothyroid, HTN, CKD stg IV, recent comminuted intertrochanteric left hip fracture S/P repair 11/29/14, previous L4-5 herniated disc status post repair 12/22/11, 5 mm LLL nodule with risk of bronchogenic cancer who presented with falls and was found to have rapid A. fib in the emergency room.  After tx for her hip fracture she went to [facility] for about  a month and then went to stay with her daughter [name]. She was recently taken back home by her family and has been staying alone at home. She walks with a walker and a cane. Cardiology was consulted. The patient was initially started on a diltiazem drip. She was transitioned to oral diltiazem. Metoprolol tartrate was added. She remained rate controlled. After discussion of the risks, benefits, alternatives, the patient was started on aspirin given her poor candidacy for anticoagulation secondary to her falls and GI bleed. Physical therapy was consulted. They recommended skilled nursing facility. The patient's family was agreeable.   Consultants: [facility]   Discharge Exam: Filed Vitals:   02/22/15 0620  BP:   Pulse:   Temp: 98.4 F (36.9 C)  Resp:    Filed Vitals:   02/21/15 1829 02/21/15 2238 02/22/15 0557 02/22/15 0620  BP: 128/78 112/59 121/60   Pulse: 85 74 80   Temp:  99.3 F (37.4 C)  98.4 F (36.9 C)  TempSrc:  Oral  Oral  Resp:  16    Height:      Weight:      SpO2:  91%     General: Alert and awake, NAD, pleasant, cooperative Cardiovascular: RRR, no rub, no gallop, no S3 Respiratory: CTAB, no wheeze, no rhonchi Abdomen:soft, nontender, nondistended, positive bowel sounds Extremities: 1+LE edema, No lymphangitis, no petechiae  Discharge Instructions      Discharge Instructions    Diet - low sodium heart healthy    Complete by:  As directed      Increase activity slowly    Complete by:  As directed             Medication List    STOP taking these medications        amLODipine 10 MG tablet  Commonly known as:  NORVASC     benzonatate 100 MG capsule  Commonly known as:  TESSALON     enoxaparin 40 MG/0.4ML injection  Commonly known as:  LOVENOX     losartan 100 MG tablet  Commonly known as:  COZAAR     oxyCODONE 5 MG immediate release tablet  Commonly known as:  Oxy IR/ROXICODONE      TAKE these medications        aspirin 81 MG  tablet  Take 81 mg by mouth daily.     CALCIUM + D PO  Take 1 tablet by mouth 2 (two) times daily.     cefdinir 300 MG capsule  Commonly known as:  OMNICEF  Take 1 capsule (300 mg total) by mouth 2 (two) times daily.     diltiazem 240 MG 24 hr capsule  Commonly known as:  CARDIZEM CD  Take 1 capsule (240 mg total) by mouth daily.     diphenhydrAMINE 25 MG tablet  Commonly known as:  BENADRYL  Take 25 mg by mouth as needed for allergies.     donepezil 5 MG tablet  Commonly known as:  ARICEPT  TAKE 1 TABLET BY MOUTH AT BEDTIME     FLUoxetine 40 MG capsule  Commonly known as:  PROZAC  TAKE ONE CAPSULE BY MOUTH EVERY DAY     HYDROcodone-acetaminophen 5-325 MG per tablet  Commonly known as:  NORCO/VICODIN  Take 1-2 tablets by mouth every 6 (six) hours as needed for moderate pain.     levothyroxine 50 MCG tablet  Commonly known as:  SYNTHROID,  LEVOTHROID  TAKE 1 TABLET BY MOUTH EVERY DAY     memantine 10 MG tablet  Commonly known as:  NAMENDA  TAKE 1 TABLET BY MOUTH TWICE A DAY     metoprolol tartrate 25 MG tablet  Commonly known as:  LOPRESSOR  Take 1 tablet (25 mg total) by mouth every 12 (twelve) hours.     ranitidine 75 MG tablet  Commonly known as:  ZANTAC  Take 75 mg by mouth daily.     Vitamin D3 2000 UNITS Tabs  Take 1 tablet by mouth daily.         The results of significant diagnostics from this hospitalization (including imaging, microbiology, ancillary and laboratory) are listed below for reference.    Significant Diagnostic Studies: Dg Chest 2 View  02/18/2015   CLINICAL DATA:  Weakness, confusion, urinary frequency, and dysuria. Tachycardia. Chest pain.  EXAM: CHEST - 2 VIEW  COMPARISON:  One-view chest 11/28/2014. Two-view chest x-ray 12/26/2011.  FINDINGS: Atherosclerotic changes are present at the aortic arch. The heart size is upper limits of normal. There is no edema or effusion to suggest failure. Mild emphysematous changes are noted. Exaggerated  thoracic kyphosis is evident. The T9 vertebral plana compression fracture has progressed. A superior endplate fracture at T6 is age indeterminate.  IMPRESSION: 1. Stable mild cardiomegaly without failure. 2. Emphysema. 3. Progressive vertebral plana compression fracture at T9. 4. Question superior endplate fracture at T6, new since 2013.   Electronically Signed   By: Marin Roberts M.D.   On: 02/18/2015 12:14     Microbiology: Recent Results (from the past 240 hour(s))  Culture, blood (x 2)     Status: None (Preliminary result)   Collection Time: 02/18/15 12:36 PM  Result Value Ref Range Status   Specimen Description BLOOD RIGHT ANTECUBITAL  Final   Special Requests BOTTLES DRAWN AEROBIC AND ANAEROBIC 5CC  Final   Culture NO GROWTH 3 DAYS  Final   Report Status PENDING  Incomplete  Culture, blood (x 2)     Status: None (Preliminary result)   Collection Time: 02/18/15 12:41 PM  Result Value Ref Range Status   Specimen Description BLOOD RIGHT HAND  Final   Special Requests BOTTLES DRAWN AEROBIC ONLY 5CC  Final   Culture NO GROWTH 3 DAYS  Final   Report Status PENDING  Incomplete  MRSA PCR Screening     Status: None   Collection Time: 02/18/15  5:21 PM  Result Value Ref Range Status   MRSA by PCR NEGATIVE NEGATIVE Final    Comment:        The GeneXpert MRSA Assay (FDA approved for NASAL specimens only), is one component of a comprehensive MRSA colonization surveillance program. It is not intended to diagnose MRSA infection nor to guide or monitor treatment for MRSA infections.   C difficile quick scan w PCR reflex     Status: None   Collection Time: 02/19/15  2:01 AM  Result Value Ref Range Status   C Diff antigen NEGATIVE NEGATIVE Final   C Diff toxin NEGATIVE NEGATIVE Final   C Diff interpretation Negative for toxigenic C. difficile  Final     Labs: Basic Metabolic Panel:  Recent Labs Lab 02/18/15 1211 02/18/15 1852 02/19/15 0750 02/20/15 0321 02/21/15 0645  02/22/15 0616  NA 147* 145 144 142 143 141  K 2.8* 2.3* 3.0* 3.6 3.7 4.2  CL 113* 116* 118* 116* 117* 115*  CO2 19* 22 18* 19* 20* 20*  GLUCOSE 101* 121*  95 77 89 84  BUN 26* 23* 21* CREATININE 2.53* 2.21* 1.98* 1.67* 1.47* 1.35*  CALCIUM 8.5* 8.0* 7.5* 7.4* 7.5* 7.4*  MG 1.8  --   --  1.4* 1.9  --    Liver Function Tests:  Recent Labs Lab 02/18/15 1852 02/20/15 0321  AST 20 23  ALT 12* 13*  ALKPHOS 175* 138*  BILITOT 0.5 0.4  PROT 5.1* 4.8*  ALBUMIN 2.0* 1.8*   No results for input(s): LIPASE, AMYLASE in the last 168 hours. No results for input(s): AMMONIA in the last 168 hours. CBC:  Recent Labs Lab 02/18/15 1211 02/18/15 1852 02/20/15 0321 02/21/15 0645 02/22/15 0616  WBC 11.5* 10.0 8.9 8.2 7.7  NEUTROABS 9.6* 8.5*  --   --   --   HGB 13.0 12.3 10.7* 11.8* 11.7*  HCT 39.5 37.5 33.9* 37.7 37.5  MCV 89.2 89.1 90.4 92.4 92.4  PLT 263 220 223 222 230   Cardiac Enzymes:  Recent Labs Lab 02/18/15 1852 02/18/15 2234 02/19/15 0508  TROPONINI 0.06* 0.05* 0.05*   BNP: Invalid input(s): POCBNP CBG: No results for input(s): GLUCAP in the last 168 hours.  Time coordinating discharge:  Greater than 30 minutes  Signed:  Krrish Freund, DO [facility] Pager: [phone] 02/22/2015, 11:46 AM

## 2015-02-22 NOTE — Progress Notes (Signed)
Patient was discharged to nursing home Jennifer [name]) by MD order; discharged instructions  review and sent to facility via EMS. Facility was called and report was given to the nurse who is going to receive the patient. IV DIC; patient will be transported to facility via EMS.

## 2015-02-22 NOTE — Progress Notes (Addendum)
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 Cognition and anxiety contributing to this. Continue to feel she needs SNF.  Follow Up Recommendations  SNF     Equipment Recommendations  None recommended by PT    Recommendations for Other Services       Precautions / Restrictions Precautions Precautions: Fall Restrictions Weight Bearing Restrictions: No    Mobility  Bed Mobility Overal bed mobility: Needs Assistance Bed Mobility: Supine to Sit     Supine to sit: Mod assist     General bed mobility comments: Assist to bring trunk up  Transfers Overall transfer level: Needs assistance Equipment used: 4-wheeled walker Transfers: Sit to/from [employer] Transfers Sit to Stand: Mod assist;+2 physical assistance Stand pivot transfers: +2 physical assistance;Mod assist       General transfer comment: Assist to bring hips up. Assist to keep pt erect as she pivots - pt anxious and begins to sit down way before she is close to chair. Verbal/tactile cues to contiue standing and taking steps when pivoting.  Ambulation/Gait                 Stairs            Wheelchair Mobility    Modified Rankin (Stroke Patients Only)       Balance Overall balance assessment: Needs assistance Sitting-balance support: No upper extremity supported;Feet supported Sitting balance-Leahy Scale: Fair     Standing balance support: Bilateral upper extremity supported Standing  balance-Leahy Scale: Poor Standing balance comment: rollator and min assist for static standing.                    Cognition Arousal/Alertness: Awake/alert Behavior During Therapy: Anxious Overall Cognitive Status: History of cognitive impairments - at baseline                      Exercises      General Comments        Pertinent Vitals/Pain Pain Assessment: No/denies pain    Home Living                      Prior Function            PT Goals (current goals can now be found in the care plan section) Acute Rehab PT Goals Patient Stated Goal: not stated Progress towards PT goals: Not progressing toward goals - comment (anxious with mobility)    Frequency  Min 2X/week    PT Plan Current plan remains appropriate;Frequency needs to be updated    Co-evaluation             End of Session Equipment Utilized During Treatment: Gait belt Activity Tolerance: Patient limited by fatigue Patient left: in chair;with call bell/phone within reach;with chair alarm set     Time: 4098-1191 PT Time Calculation (min) (ACUTE ONLY): 26 min  Charges:  $Therapeutic Exercise: 23-37 mins  G Codes:      Coreon Simkins 02/22/2015, 12:02 PM  [facility] PT [phone]

## 2015-02-22 NOTE — Care Management Note (Signed)
Case Management Note  Patient Details  Name: JULIANAH MARCIEL MRN: [id number] Date of Birth: 11-17-1928  Subjective/Objective:   Patient for dc to [facility] per CSW note.                Action/Plan:   Expected Discharge Date:                  Expected Discharge Plan:  Skilled Nursing Facility  In-House Referral:  Clinical Social Work  Discharge planning Services  CM Consult  Post Acute Care Choice:    Choice offered to:     DME Arranged:    DME Agency:     HH Arranged:    HH Agency:     Status of Service:  Completed, signed off  Medicare Important Message Given:  Yes-second notification given Date Medicare IM Given:    Medicare IM give by:    Date Additional Medicare IM Given:    Additional Medicare Important Message give by:     If discussed at Long Length of Stay Meetings, dates discussed:    Additional Comments:  Leone Haven, RN 02/22/2015, 3:36 PM

## 2015-02-22 NOTE — Clinical Social Work Note (Signed)
Clinical Social Worker facilitated patient discharge including contacting patient family and facility to confirm patient discharge plans.  Clinical information faxed to facility and family agreeable with plan.  CSW arranged ambulance transport via [facility] to [facility] .  RN to call report prior to discharge.  Clinical Social Worker will sign off for now as social work intervention is no longer needed. Please consult us again if new need arises.  Jesse Helayna Dun, LCSW [phone] 

## 2015-02-22 NOTE — Progress Notes (Signed)
[facility] Progress Note  Subjective: No further rectal bleeding reported.  Objective: Vital signs in last 24 hours: Temp:  [98.3 F (36.8 C)-99.3 F (37.4 C)] 98.4 F (36.9 C) (08/09 0620) Pulse Rate:  [74-104] 80 (08/09 0557) Resp:  [16-20] 16 (08/08 2238) BP: (112-128)/(59-82) 121/60 mmHg (08/09 0557) SpO2:  [91 %-96 %] 91 % (08/08 2238) Weight change:    PE:  She is in no distress  Abdomen soft  Lab Results: Results for orders placed or performed during the hospital encounter of 02/18/15 (from the past 24 hour(s))  CBC     Status: Abnormal   Collection Time: 02/22/15  6:16 AM  Result Value Ref Range   WBC 7.7 4.0 - 10.5 K/uL   RBC 4.06 3.87 - 5.11 MIL/uL   Hemoglobin 11.7 (L) 12.0 - 15.0 g/dL   HCT 40.9 81.1 - 91.4 %   MCV 92.4 78.0 - 100.0 fL   MCH 28.8 26.0 - 34.0 pg   MCHC 31.2 30.0 - 36.0 g/dL   RDW 78.2 (H) 95.6 - 21.3 %   Platelets 230 150 - 400 K/uL  Basic metabolic panel     Status: Abnormal   Collection Time: 02/22/15  6:16 AM  Result Value Ref Range   Sodium 141 135 - 145 mmol/L   Potassium 4.2 3.5 - 5.1 mmol/L   Chloride 115 (H) 101 - 111 mmol/L   CO2 20 (L) 22 - 32 mmol/L   Glucose, Bld 84 65 - 99 mg/dL   BUN 9 6 - 20 mg/dL   Creatinine, Ser 0.86 (H) 0.44 - 1.00 mg/dL   Calcium 7.4 (L) 8.9 - 10.3 mg/dL   GFR calc non Af Amer 35 (L) >60 mL/min   GFR calc Af Amer 41 (L) >60 mL/min   Anion gap 6 5 - 15    Studies/Results: No results found.    Assessment: Rectal bleeding. Most likely of diverticular origin or hemorrhoidal. No colonoscopy planned. Hemoglobin stable.  Plan:   Observation. No GI intervention planned at this time. We will sign off.    Gwenevere Abbot 02/22/2015, 9:42 AM  Pager: [phone] If no answer or after 5 PM call [phone] Lab Results  Component Value Date   HGB 11.7* 02/22/2015   HGB 11.8* 02/21/2015   HGB 10.7* 02/20/2015   HCT 37.5 02/22/2015   HCT 37.7 02/21/2015   HCT 33.9* 02/20/2015   ALKPHOS 138* 02/20/2015   ALKPHOS 175* 02/18/2015   ALKPHOS 97 11/29/2014   AST 23 02/20/2015   AST 20 02/18/2015   AST 18 11/29/2014   ALT 13* 02/20/2015   ALT 12* 02/18/2015   ALT 12* 11/29/2014

## 2015-02-23 LAB — CULTURE, BLOOD (ROUTINE X 2)
CULTURE: NO GROWTH
Culture: NO GROWTH

## 2015-02-23 LAB — URINE CULTURE: Special Requests: NORMAL

## 2015-02-24 DIAGNOSIS — K921 Melena: Secondary | ICD-10-CM | POA: Diagnosis not present

## 2015-02-24 DIAGNOSIS — I4891 Unspecified atrial fibrillation: Secondary | ICD-10-CM | POA: Diagnosis not present

## 2015-02-24 DIAGNOSIS — N390 Urinary tract infection, site not specified: Secondary | ICD-10-CM | POA: Diagnosis not present

## 2015-02-24 DIAGNOSIS — A419 Sepsis, unspecified organism: Secondary | ICD-10-CM | POA: Diagnosis not present

## 2015-02-24 DIAGNOSIS — N183 Chronic kidney disease, stage 3 (moderate): Secondary | ICD-10-CM | POA: Diagnosis not present

## 2015-04-01 ENCOUNTER — Ambulatory Visit: Payer: Medicare Other | Admitting: Cardiology

## 2015-04-02 DIAGNOSIS — F0150 Vascular dementia without behavioral disturbance: Secondary | ICD-10-CM | POA: Diagnosis not present

## 2015-04-02 DIAGNOSIS — R278 Other lack of coordination: Secondary | ICD-10-CM | POA: Diagnosis not present

## 2015-04-02 DIAGNOSIS — I4891 Unspecified atrial fibrillation: Secondary | ICD-10-CM | POA: Diagnosis not present

## 2015-04-02 DIAGNOSIS — R2689 Other abnormalities of gait and mobility: Secondary | ICD-10-CM | POA: Diagnosis not present

## 2015-04-02 DIAGNOSIS — R918 Other nonspecific abnormal finding of lung field: Secondary | ICD-10-CM | POA: Diagnosis not present

## 2015-04-02 DIAGNOSIS — N183 Chronic kidney disease, stage 3 (moderate): Secondary | ICD-10-CM | POA: Diagnosis not present

## 2015-04-02 DIAGNOSIS — M6281 Muscle weakness (generalized): Secondary | ICD-10-CM | POA: Diagnosis not present

## 2015-04-02 DIAGNOSIS — R471 Dysarthria and anarthria: Secondary | ICD-10-CM | POA: Diagnosis not present

## 2015-04-02 DIAGNOSIS — N390 Urinary tract infection, site not specified: Secondary | ICD-10-CM | POA: Diagnosis not present

## 2015-04-04 DIAGNOSIS — R278 Other lack of coordination: Secondary | ICD-10-CM | POA: Diagnosis not present

## 2015-04-04 DIAGNOSIS — N390 Urinary tract infection, site not specified: Secondary | ICD-10-CM | POA: Diagnosis not present

## 2015-04-04 DIAGNOSIS — R471 Dysarthria and anarthria: Secondary | ICD-10-CM | POA: Diagnosis not present

## 2015-04-04 DIAGNOSIS — N183 Chronic kidney disease, stage 3 (moderate): Secondary | ICD-10-CM | POA: Diagnosis not present

## 2015-04-04 DIAGNOSIS — I4891 Unspecified atrial fibrillation: Secondary | ICD-10-CM | POA: Diagnosis not present

## 2015-04-04 DIAGNOSIS — F0150 Vascular dementia without behavioral disturbance: Secondary | ICD-10-CM | POA: Diagnosis not present

## 2015-04-04 DIAGNOSIS — M6281 Muscle weakness (generalized): Secondary | ICD-10-CM | POA: Diagnosis not present

## 2015-04-04 DIAGNOSIS — R918 Other nonspecific abnormal finding of lung field: Secondary | ICD-10-CM | POA: Diagnosis not present

## 2015-04-04 DIAGNOSIS — R2689 Other abnormalities of gait and mobility: Secondary | ICD-10-CM | POA: Diagnosis not present

## 2015-04-29 DIAGNOSIS — R531 Weakness: Secondary | ICD-10-CM | POA: Diagnosis not present

## 2015-05-02 ENCOUNTER — Telehealth: Payer: Self-pay | Admitting: Internal Medicine

## 2015-05-02 MED ORDER — METOPROLOL TARTRATE 25 MG PO TABS: 25.0000 mg | ORAL_TABLET | Freq: Two times a day (BID) | ORAL | Status: AC

## 2015-05-02 NOTE — Telephone Encounter (Signed)
Pt daughter call to say that the hospital put her Mom on the following med and they need a refill metoprolol tartrate (LOPRESSOR) 25 MG tablet   Pt daughter said pt need a hospital bed and a lift is asking how to get this.    [facility]

## 2015-05-02 NOTE — Telephone Encounter (Signed)
Spoke to pt's daughter Arline AspCindy, told her Rx was sent to pharmacy and pt would need a Face to Face in order to order hospital bed and lift. Arline AspCindy said pt is homebound unable to get here unless call for ambulance. Pt was discharged from a nursing home. Told Cindy to contact nursing home to see if they can help her get a bed and lift if not call me back and I will see what I can do. Cindy verbalized understanding.

## 2015-05-04 ENCOUNTER — Telehealth: Payer: Self-pay | Admitting: Internal Medicine

## 2015-05-04 NOTE — Telephone Encounter (Signed)
Daughter ask if you Lupita [place]na would give her a call

## 2015-05-05 NOTE — Telephone Encounter (Signed)
Pt's daughter Arline AspCindy calling again to request call back from [place] regarding follow up from nursing home.

## 2015-05-05 NOTE — Telephone Encounter (Signed)
Spoke to Jansenindy regarding pt, she said she called the Nursing home where pt was discharged from and they will not help her get a hospital bed and lift. Arline AspCindy also said pt is on Respiradol and they would like to wean her off due to sleeping all the time. Burley Saverold Cindy Dr.K is out of the office today will be back tomorrow and I will discuss with him and see if we can help her with out coming in for a Face to Face. Cindy verbalized understanding.

## 2015-05-06 ENCOUNTER — Telehealth: Payer: Self-pay | Admitting: Internal Medicine

## 2015-05-06 DIAGNOSIS — N179 Acute kidney failure, unspecified: Secondary | ICD-10-CM

## 2015-05-06 DIAGNOSIS — F0390 Unspecified dementia without behavioral disturbance: Secondary | ICD-10-CM

## 2015-05-06 DIAGNOSIS — Z515 Encounter for palliative care: Secondary | ICD-10-CM

## 2015-05-06 DIAGNOSIS — N189 Chronic kidney disease, unspecified: Secondary | ICD-10-CM

## 2015-05-06 NOTE — Telephone Encounter (Signed)
Discussed situation with Dr.K, regarding pt unable to come to office is bedridden and now the family is requesting Hospice. Verbal order given to do urgent referral for Hospice.

## 2015-05-06 NOTE — Telephone Encounter (Signed)
Pt daughter would like a order for hospice service. Pt has dementia. Pt has a rattle in her chest and decline to see go to hospital.

## 2015-05-06 NOTE — Telephone Encounter (Signed)
See other message. Pt taken care of family decide they wanted Hospice.

## 2015-05-06 NOTE — Telephone Encounter (Signed)
Spoke to pt's daughter Arline AspCindy, told her I discussed situation with Dr.K and he said it was fine to do referral to Hospice. Told pt urgent referral was put in and someone will contact her today. Cindy verbalized understanding.

## 2015-05-09 ENCOUNTER — Telehealth: Payer: Self-pay | Admitting: Internal Medicine

## 2015-05-09 NOTE — Telephone Encounter (Signed)
Patient has expired 07-Nov-2014 at 8 AM.

## 2015-05-09 NOTE — Telephone Encounter (Signed)
FYI

## 2015-05-17 DEATH — deceased
